# Patient Record
Sex: Male | Born: 1965 | Race: White | Hispanic: No | Marital: Married | Smoking: Former smoker
Health system: Southern US, Community
[De-identification: ages and names within clinical notes are randomized; demographics above are authoritative.]

## PROBLEM LIST (undated history)

## (undated) DIAGNOSIS — F419 Anxiety disorder, unspecified: Secondary | ICD-10-CM

## (undated) DIAGNOSIS — Z87442 Personal history of urinary calculi: Secondary | ICD-10-CM

## (undated) DIAGNOSIS — D72819 Decreased white blood cell count, unspecified: Secondary | ICD-10-CM

## (undated) DIAGNOSIS — R5081 Fever presenting with conditions classified elsewhere: Secondary | ICD-10-CM

## (undated) DIAGNOSIS — J189 Pneumonia, unspecified organism: Secondary | ICD-10-CM

## (undated) DIAGNOSIS — D709 Neutropenia, unspecified: Secondary | ICD-10-CM

## (undated) DIAGNOSIS — E0590 Thyrotoxicosis, unspecified without thyrotoxic crisis or storm: Secondary | ICD-10-CM

## (undated) DIAGNOSIS — E039 Hypothyroidism, unspecified: Secondary | ICD-10-CM

## (undated) DIAGNOSIS — Z923 Personal history of irradiation: Secondary | ICD-10-CM

## (undated) DIAGNOSIS — R590 Localized enlarged lymph nodes: Secondary | ICD-10-CM

## (undated) DIAGNOSIS — E0500 Thyrotoxicosis with diffuse goiter without thyrotoxic crisis or storm: Secondary | ICD-10-CM

## (undated) HISTORY — DX: Neutropenia, unspecified: D70.9

## (undated) HISTORY — PX: CLAVICLE SURGERY: SHX598

## (undated) HISTORY — DX: Hypothyroidism, unspecified: E03.9

## (undated) HISTORY — PX: FRACTURE SURGERY: SHX138

## (undated) HISTORY — PX: LITHOTRIPSY: SUR834

## (undated) HISTORY — DX: Thyrotoxicosis, unspecified without thyrotoxic crisis or storm: E05.90

---

## 2000-02-17 ENCOUNTER — Encounter: Payer: Self-pay | Admitting: Orthopedic Surgery

## 2000-02-17 ENCOUNTER — Ambulatory Visit (HOSPITAL_COMMUNITY): Admission: RE | Admit: 2000-02-17 | Discharge: 2000-02-17 | Payer: Self-pay | Admitting: Orthopedic Surgery

## 2000-09-03 ENCOUNTER — Emergency Department (HOSPITAL_COMMUNITY): Admission: EM | Admit: 2000-09-03 | Discharge: 2000-09-03 | Payer: Self-pay | Admitting: Emergency Medicine

## 2000-09-03 ENCOUNTER — Encounter: Payer: Self-pay | Admitting: Emergency Medicine

## 2003-03-23 ENCOUNTER — Encounter: Admission: RE | Admit: 2003-03-23 | Discharge: 2003-03-23 | Payer: Self-pay | Admitting: Internal Medicine

## 2003-07-03 ENCOUNTER — Emergency Department (HOSPITAL_COMMUNITY): Admission: AD | Admit: 2003-07-03 | Discharge: 2003-07-03 | Payer: Self-pay | Admitting: Family Medicine

## 2007-04-26 ENCOUNTER — Emergency Department (HOSPITAL_COMMUNITY): Admission: EM | Admit: 2007-04-26 | Discharge: 2007-04-26 | Payer: Self-pay | Admitting: Emergency Medicine

## 2008-03-05 ENCOUNTER — Ambulatory Visit (HOSPITAL_COMMUNITY): Admission: RE | Admit: 2008-03-05 | Discharge: 2008-03-05 | Payer: Self-pay | Admitting: Urology

## 2008-03-08 ENCOUNTER — Emergency Department (HOSPITAL_COMMUNITY): Admission: EM | Admit: 2008-03-08 | Discharge: 2008-03-09 | Payer: Self-pay | Admitting: Emergency Medicine

## 2011-02-21 LAB — URINALYSIS, ROUTINE W REFLEX MICROSCOPIC
Nitrite: NEGATIVE
Protein, ur: NEGATIVE
Specific Gravity, Urine: 1.028
pH: 6

## 2011-02-21 LAB — URINE MICROSCOPIC-ADD ON

## 2012-02-21 ENCOUNTER — Emergency Department (HOSPITAL_COMMUNITY): Payer: BC Managed Care – PPO

## 2012-02-21 ENCOUNTER — Emergency Department (HOSPITAL_COMMUNITY)
Admission: EM | Admit: 2012-02-21 | Discharge: 2012-02-21 | Disposition: A | Discharge status: Home / Self Care | Payer: BC Managed Care – PPO | Attending: Emergency Medicine | Admitting: Emergency Medicine

## 2012-02-21 ENCOUNTER — Encounter (HOSPITAL_COMMUNITY): Payer: Self-pay | Admitting: Emergency Medicine

## 2012-02-21 DIAGNOSIS — M25571 Pain in right ankle and joints of right foot: Secondary | ICD-10-CM

## 2012-02-21 DIAGNOSIS — M25579 Pain in unspecified ankle and joints of unspecified foot: Secondary | ICD-10-CM | POA: Insufficient documentation

## 2012-02-21 DIAGNOSIS — R079 Chest pain, unspecified: Secondary | ICD-10-CM | POA: Insufficient documentation

## 2012-02-21 DIAGNOSIS — R0781 Pleurodynia: Secondary | ICD-10-CM

## 2012-02-21 MED ORDER — OXYCODONE-ACETAMINOPHEN 5-325 MG PO TABS
2.0000 | ORAL_TABLET | Freq: Once | ORAL | Status: AC
  Administered 2012-02-21: 2 via ORAL
  Filled 2012-02-21: qty 2

## 2012-02-21 MED ORDER — HYDROCODONE-ACETAMINOPHEN 5-325 MG PO TABS: 2.0000 | ORAL_TABLET | ORAL | Status: DC | PRN
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
## 2012-02-21 NOTE — ED Provider Notes (Signed)
Medical screening examination/treatment/procedure(s) were performed by non-physician practitioner and as supervising physician I was immediately available for consultation/collaboration.   Benny Lennert, MD 02/21/12 [phone]

## 2012-02-21 NOTE — ED Notes (Signed)
Patient transported to X-ray 
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
## 2014-05-20 ENCOUNTER — Emergency Department (HOSPITAL_COMMUNITY): Payer: BC Managed Care – PPO

## 2014-05-20 ENCOUNTER — Encounter (HOSPITAL_COMMUNITY): Payer: Self-pay | Admitting: *Deleted

## 2014-05-20 ENCOUNTER — Emergency Department (HOSPITAL_COMMUNITY)
Admission: EM | Admit: 2014-05-20 | Discharge: 2014-05-20 | Disposition: A | Discharge status: Home / Self Care | Payer: BC Managed Care – PPO | Attending: Emergency Medicine | Admitting: Emergency Medicine

## 2014-05-20 DIAGNOSIS — Z720 Tobacco use: Secondary | ICD-10-CM | POA: Insufficient documentation

## 2014-05-20 DIAGNOSIS — G5632 Lesion of radial nerve, left upper limb: Secondary | ICD-10-CM

## 2014-05-20 DIAGNOSIS — R531 Weakness: Secondary | ICD-10-CM | POA: Diagnosis present

## 2014-05-20 DIAGNOSIS — M21332 Wrist drop, left wrist: Secondary | ICD-10-CM | POA: Insufficient documentation

## 2014-05-20 DIAGNOSIS — R29898 Other symptoms and signs involving the musculoskeletal system: Secondary | ICD-10-CM

## 2014-05-20 LAB — CBC
HEMATOCRIT: 38.1 % — AB (ref 39.0–52.0)
HEMOGLOBIN: 12.9 g/dL — AB (ref 13.0–17.0)
MCH: 31.5 pg (ref 26.0–34.0)
MCHC: 33.9 g/dL (ref 30.0–36.0)
MCV: 93.2 fL (ref 78.0–100.0)
PLATELETS: 262 10*3/uL (ref 150–400)
RBC: 4.09 MIL/uL — ABNORMAL LOW (ref 4.22–5.81)
RDW: 13.4 % (ref 11.5–15.5)
WBC: 6.3 10*3/uL (ref 4.0–10.5)

## 2014-05-20 LAB — COMPREHENSIVE METABOLIC PANEL
ALT: 31 U/L (ref 0–53)
AST: 26 U/L (ref 0–37)
Albumin: 4.4 g/dL (ref 3.5–5.2)
Alkaline Phosphatase: 55 U/L (ref 39–117)
Anion gap: 7 (ref 5–15)
BILIRUBIN TOTAL: 1.2 mg/dL (ref 0.3–1.2)
BUN: 16 mg/dL (ref 6–23)
CALCIUM: 9 mg/dL (ref 8.4–10.5)
CHLORIDE: 107 meq/L (ref 96–112)
CO2: 24 mmol/L (ref 19–32)
Creatinine, Ser: 0.81 mg/dL (ref 0.50–1.35)
Glucose, Bld: 96 mg/dL (ref 70–99)
POTASSIUM: 3.9 mmol/L (ref 3.5–5.1)
Sodium: 138 mmol/L (ref 135–145)
TOTAL PROTEIN: 6.3 g/dL (ref 6.0–8.3)

## 2014-05-20 LAB — DIFFERENTIAL
BASOS ABS: 0 10*3/uL (ref 0.0–0.1)
BASOS PCT: 0 % (ref 0–1)
EOS PCT: 1 % (ref 0–5)
Eosinophils Absolute: 0 10*3/uL (ref 0.0–0.7)
LYMPHS PCT: 23 % (ref 12–46)
Lymphs Abs: 1.4 10*3/uL (ref 0.7–4.0)
MONO ABS: 0.6 10*3/uL (ref 0.1–1.0)
MONOS PCT: 10 % (ref 3–12)
NEUTROS ABS: 4.2 10*3/uL (ref 1.7–7.7)
Neutrophils Relative %: 66 % (ref 43–77)

## 2014-05-20 LAB — I-STAT TROPONIN, ED: Troponin i, poc: 0 ng/mL (ref 0.00–0.08)

## 2014-05-20 NOTE — ED Provider Notes (Signed)
CSN: [id number]     Arrival date & time 05/20/14  1620 History   First MD Initiated Contact with Patient 05/20/14 1641     Chief Complaint  Patient presents with  . Weakness     (Consider location/radiation/quality/duration/timing/severity/associated sxs/prior Treatment) HPI Comments: Pt has had weakness in L hand & wrist since 3:30 am this morning.   Patient is a 48 y.o. male presenting with weakness. The history is provided by the patient. No language interpreter was used.  Weakness This is a new problem. The current episode started 12 to 24 hours ago. The problem occurs constantly. The problem has been gradually improving. Pertinent negatives include no chest pain, no abdominal pain, no headaches and no shortness of breath. Associated symptoms comments: Bitemporal h/a worse with cough and pain at medial upper arm. Nothing aggravates the symptoms. Nothing relieves the symptoms. He has tried nothing for the symptoms. The treatment provided no relief.    History reviewed. No pertinent past medical history. History reviewed. No pertinent past surgical history. History reviewed. No pertinent family history. History  Substance Use Topics  . Smoking status: Current Every Day Smoker -- 0.50 packs/day    Types: Cigarettes  . Smokeless tobacco: Not on file  . Alcohol Use: No    Review of Systems  Constitutional: Negative for fever, activity change, appetite change and fatigue.  HENT: Negative for congestion, facial swelling, rhinorrhea and trouble swallowing.   Eyes: Negative for photophobia and pain.  Respiratory: Negative for cough, chest tightness and shortness of breath.   Cardiovascular: Negative for chest pain and leg swelling.  Gastrointestinal: Negative for nausea, vomiting, abdominal pain, diarrhea and constipation.  Endocrine: Negative for polydipsia and polyuria.  Genitourinary: Negative for dysuria, urgency, decreased urine volume and difficulty urinating.  Musculoskeletal:  Negative for back pain and gait problem.  Skin: Negative for color change, rash and wound.  Allergic/Immunologic: Negative for immunocompromised state.  Neurological: Positive for weakness. Negative for dizziness, facial asymmetry, speech difficulty, numbness and headaches.  Psychiatric/Behavioral: Negative for confusion, decreased concentration and agitation.      Allergies  Review of patient's allergies indicates no known allergies.  Home Medications   Prior to Admission medications   Medication Sig Start Date End Date Taking? Authorizing Provider  HYDROcodone-acetaminophen (NORCO/VICODIN) 5-325 MG per tablet Take 2 tablets by mouth every 4 (four) hours as needed for pain. 02/21/12   Hannah Muthersbaugh, PA-C   BP 114/72 mmHg  Pulse 70  Temp(Src) 98 F (36.7 C)  Resp 17  Wt 155 lb (70.308 kg)  SpO2 96% Physical Exam  Constitutional: [name] oriented to person, place, and time. He appears well-developed and well-nourished. No distress.  HENT:  Head: Normocephalic and atraumatic.  Mouth/Throat: No oropharyngeal exudate.  Eyes: Pupils are equal, round, and reactive to light.  Neck: Normal range of motion. Neck supple.  Cardiovascular: Normal rate, regular rhythm and normal heart sounds.  Exam reveals no gallop and no friction rub.   No murmur heard. Pulmonary/Chest: Effort normal and breath sounds normal. No respiratory distress. He has no wheezes. He has no rales.  Abdominal: Soft. Bowel sounds are normal. He exhibits no distension and no mass. There is no tenderness. There is no rebound and no guarding.  Musculoskeletal: Normal range of motion. He exhibits no edema or tenderness.       Arms: Neurological: [name] alert and oriented to person, place, and time. He displays no atrophy and no tremor. No cranial nerve deficit or sensory deficit. He  exhibits abnormal muscle tone. He displays no seizure activity. Coordination normal. GCS eye subscore is 4. GCS verbal subscore is 5. GCS  motor subscore is 6.  Weakness in L wrist, hand, nml sensation.   Skin: Skin is warm and dry.  Psychiatric: He has a normal mood and affect.    ED Course  Procedures (including critical care time) Labs Review Labs Reviewed  CBC - Abnormal; Notable for the following:    RBC 4.09 (*)    Hemoglobin 12.9 (*)    HCT 38.1 (*)    All other components within normal limits  DIFFERENTIAL  COMPREHENSIVE METABOLIC PANEL  I-STAT TROPOININ, ED    Imaging Review Ct Head Wo Contrast  05/20/2014   CLINICAL DATA:  Left-sided weakness.  Pain/ discomfort in left arm.  EXAM: CT HEAD WITHOUT CONTRAST  TECHNIQUE: Contiguous axial images were obtained from the base of the skull through the vertex without intravenous contrast.  COMPARISON:  None.  FINDINGS: Sinuses/Soft tissues: Cerumen in the left external ear canal. Clear paranasal sinuses and mastoid air cells.  Intracranial: No mass lesion, hemorrhage, hydrocephalus, acute infarct, intra-axial, or extra-axial fluid collection.  IMPRESSION: Normal head CT.   Electronically Signed   By: Abigail Miyamoto M.D.   On: 05/20/2014 18:17   Mr Brain Wo Contrast  05/20/2014   CLINICAL DATA:  Left hand weakness  EXAM: MRI HEAD WITHOUT CONTRAST  TECHNIQUE: Multiplanar, multiecho pulse sequences of the brain and surrounding structures were obtained without intravenous contrast.  COMPARISON:  CT head 05/20/2014  FINDINGS: Negative for acute infarct.  Several small white matter hyperintensities bilaterally have a chronic appearance. No cortical infarct. Brainstem and cerebellum appear normal  Negative for hemorrhage or fluid collection.  Negative for mass or edema.  Ventricle size is normal. Pituitary normal in size. Paranasal sinuses are clear.  IMPRESSION: No acute abnormality. Scattered small white matter hyperintensities are typical for age and may be related to chronic microvascular ischemia or migraine headache.   Electronically Signed   By: Franchot Gallo M.D.   On:  05/20/2014 20:01     EKG Interpretation None      MDM   Final diagnoses:  Weakness of left hand  Left hand weakness  Wrist drop, left  Radial nerve palsy, left    Pt is a 48 y.o. male with Pmhx as above who presents with L hand weakness since 3:30 am (LSN 8:30 PM yesterday).  He has also had bitemporal h/a, worse w/ cough, and pain in medial upper L arm w/ ttp but no skin changes or swelling. No other focal neuro findings, no numbness or weakness.   CT head nml, spoke w/ neuro who rec MRI which was also nml. Suspect radial nerve palsy.    Micah E Peragine evaluation in the Emergency Department is complete. It has been determined that no acute conditions requiring further emergency intervention are present at this time. The patient/guardian have been advised of the diagnosis and plan. We have discussed signs and symptoms that warrant return to the ED, such as changes or worsening in symptoms, worsening pain, arm swelling, worsening weakness, numbness. WIll place in splint and have him f/u woth hand surgery.       Ernestina Patches, MD 05/20/14 2037

## 2014-05-20 NOTE — ED Notes (Signed)
PT refused wrist splint.

## 2014-05-20 NOTE — ED Notes (Addendum)
Pt reports waking up this am with "funny feeling" to his left arm. Reports a pain/discomfort to left upper arm,  Feel like he doesn't have full use of his left arm and it feels heavy, difficulty gripping objects. Reports also having headaches today, grip is slightly weaker on left side.

## 2014-05-20 NOTE — ED Notes (Signed)
Patient returned from CT

## 2014-05-20 NOTE — Discharge Instructions (Signed)
Radial Nerve Palsy Wrist drop is also known as radial nerve palsy. It is a condition in which you can not extend your wrist. This means if you are standing with your elbow bent at a right angle and with the top of your hand pointed at the ceiling, you can not hold your hand up. It falls toward the floor.  This action of extending your wrist is caused by the muscles in the back of your arm. These muscles are controlled by the radial nerve. This means that anything affecting the radial nerve so it can not tell the muscles how to work will cause wrist drop. This is medically called radial nerve palsy. Also the radial nerve is a motor and sensory nerve so anything affecting it causes problems with movement and feeling. CAUSES  Some more common causes of wrist drop are:  A break (fracture) of the large bone in the arm between your shoulder and your elbow (humerus). This is because the radial nerve winds around the humerus.  Improper use of crutches causes this because the radial nerve runs through the armpit (axilla). Crutches which are too long can put pressure on the nerve. This is sometimes called crutch palsy.  Falling asleep with your arm over a chair and supported on the back is a common cause. This is sometimes called Saturday Night Syndrome.  Wrist drop can be associated with lead poisoning because of the effect of lead on the radial nerve. SYMPTOMS  The wrist drop is an obvious problem, but there may also be numbness in the back of the arm, forearm or hand which provides feeling in these areas by the radial nerve. There can be difficulty straightening out the elbow in addition to the wrist. There may be numbness, tingling, pain, burning sensations or other abnormal feelings. Symptoms depend entirely on where the radial nerve is injured. DIAGNOSIS   Wrist drop is obvious just by looking at it. Your caregiver may make the diagnosis by taking your history and doing a couple tests.  One test which  may be done is a nerve conduction study. This test shows if the radial nerve is conducting signals well. If not, it can determine where the nerve problem is.  Sometimes X-ray studies are done. Your caregiver will determine if further testing needs to be done. TREATMENT   Usually if the problem is found to be pressure on the nerve, simply removing the pressure will allow the nerve to go back to normal in a few weeks to a few months. Other treatments will depend upon the cause found.  Only take over-the-counter or prescription medicines for pain, discomfort, or fever as directed by your caregiver.  Sometimes seizure medications are used.  Steroids are sometimes given to decrease swelling if it is thought to be a possible cause. Document Released: 01/12/2006 Document Revised: 07/31/2011 Document Reviewed: 07/23/2013 Exit[employer] Patient Information 2015 [employer]. This information is not intended to replace advice given to you by your health care provider. Make sure you discuss any questions you have with your health care provider.  

## 2014-12-28 ENCOUNTER — Encounter: Payer: Self-pay | Admitting: Internal Medicine

## 2015-01-12 ENCOUNTER — Ambulatory Visit (AMBULATORY_SURGERY_CENTER): Payer: Self-pay

## 2015-01-12 VITALS — Ht 68.0 in | Wt 162.4 lb

## 2015-01-12 DIAGNOSIS — Z1211 Encounter for screening for malignant neoplasm of colon: Secondary | ICD-10-CM

## 2015-01-12 NOTE — Progress Notes (Signed)
Per pt, no allergies to soy or egg products.Pt not taking any weight loss meds or using  O2 at home. 

## 2015-01-14 ENCOUNTER — Other Ambulatory Visit: Payer: Self-pay | Admitting: Internal Medicine

## 2015-01-14 DIAGNOSIS — R740 Nonspecific elevation of levels of transaminase and lactic acid dehydrogenase [LDH]: Principal | ICD-10-CM

## 2015-01-14 DIAGNOSIS — R7401 Elevation of levels of liver transaminase levels: Secondary | ICD-10-CM

## 2015-01-19 ENCOUNTER — Ambulatory Visit
Admission: RE | Admit: 2015-01-19 | Discharge: 2015-01-19 | Disposition: A | Discharge status: Home / Self Care | Payer: BLUE CROSS/BLUE SHIELD | Source: Ambulatory Visit | Attending: Internal Medicine | Admitting: Internal Medicine

## 2015-01-19 DIAGNOSIS — R740 Nonspecific elevation of levels of transaminase and lactic acid dehydrogenase [LDH]: Principal | ICD-10-CM

## 2015-01-19 DIAGNOSIS — R7401 Elevation of levels of liver transaminase levels: Secondary | ICD-10-CM

## 2015-02-02 ENCOUNTER — Encounter: Payer: Self-pay | Admitting: Internal Medicine

## 2015-02-10 ENCOUNTER — Ambulatory Visit (AMBULATORY_SURGERY_CENTER): Payer: BLUE CROSS/BLUE SHIELD | Admitting: Internal Medicine

## 2015-02-10 ENCOUNTER — Encounter: Payer: Self-pay | Admitting: Internal Medicine

## 2015-02-10 VITALS — BP 113/81 | HR 71 | Temp 95.9°F | Resp 15 | Ht 68.0 in | Wt 162.0 lb

## 2015-02-10 DIAGNOSIS — D129 Benign neoplasm of anus and anal canal: Secondary | ICD-10-CM

## 2015-02-10 DIAGNOSIS — R3989 Other symptoms and signs involving the genitourinary system: Secondary | ICD-10-CM

## 2015-02-10 DIAGNOSIS — Z1211 Encounter for screening for malignant neoplasm of colon: Secondary | ICD-10-CM | POA: Diagnosis not present

## 2015-02-10 DIAGNOSIS — K621 Rectal polyp: Secondary | ICD-10-CM | POA: Diagnosis not present

## 2015-02-10 DIAGNOSIS — D128 Benign neoplasm of rectum: Secondary | ICD-10-CM

## 2015-02-10 MED ORDER — SODIUM CHLORIDE 0.9 % IV SOLN: 500.0000 mL | INTRAVENOUS | Status: DC

## 2015-02-10 NOTE — Progress Notes (Signed)
Patient awakening,vss,report to rn 

## 2015-02-10 NOTE — Progress Notes (Signed)
Called to room to assist during endoscopic procedure.  Patient ID and intended procedure confirmed with present staff. Received instructions for my participation in the procedure from the performing physician.  

## 2015-02-10 NOTE — Op Note (Signed)
[name]  [name] [address] [place], [id number]   COLONOSCOPY PROCEDURE REPORT  PATIENT: Lance [name], Lance [name]  MR#: [id number] BIRTHDATE: 1965-08-15 , 49  yrs. old GENDER: male ENDOSCOPIST: Gatha Mayer, MD, [facility] PROCEDURE DATE:  02/10/2015 PROCEDURE:   Colonoscopy, screening and Colonoscopy with snare polypectomy First Screening Colonoscopy - Avg.  risk and is 50 yrs.  old or older Yes.  Prior Negative Screening - Now for repeat screening. N/A      Polyps removed today? Yes ASA CLASS:   Class I INDICATIONS:Screening for colonic neoplasia and Colorectal Neoplasm Risk Assessment for this procedure is average risk. MEDICATIONS: Propofol 250 mg IV and Monitored anesthesia care  DESCRIPTION OF PROCEDURE:   After the risks benefits and alternatives of the procedure were thoroughly explained, informed consent was obtained.  The digital rectal exam revealed nodules on prostate without urinary obstruction.   The LB [id number] endoscope was introduced through the anus and advanced to the   . No adverse events experienced.   The quality of the prep was excellent.  (MiraLax was used)  The instrument was then slowly withdrawn as the colon was fully examined. Estimated blood loss is zero unless otherwise noted in this procedure report.      COLON FINDINGS: A smooth sessile polyp measuring 3 mm in size was found in the rectum.  A polypectomy was performed with a cold snare.  The resection was complete, the polyp tissue was completely retrieved and sent to histology.   The examination was otherwise normal.  Retroflexed views revealed no abnormalities. The time to cecum = 2.6 Withdrawal time = 10.0   The scope was withdrawn and the procedure completed. COMPLICATIONS: There were no immediate complications.  ENDOSCOPIC IMPRESSION: 1.   Sessile polyp was found in the rectum; polypectomy was performed with a cold snare 2.   The examination was otherwise normal of the  colon. excellent prep - first screening 3.   Subtle nodularity to Right proximal lobe of prostate  RECOMMENDATIONS: 1.  Timing of repeat colonoscopy will be determined by pathology findings. 2.  Contact/see Dr.  Ardeth Perfect about suspected nodules on prostate exam.  eSigned:  Gatha Mayer, MD, [facility] 02/10/2015 2:19 PM   cc: Dr. Velna Hatchet and The Patient

## 2015-02-10 NOTE — Patient Instructions (Addendum)
I found and removed one small polyp that looks benign. I will let you know pathology results and when to have another routine colonoscopy by mail.  I thought your prostate has some small nodules - not sure what this means but should be examined again by primary MD or a urologist I think.  Please contact your primary MD Dr. Ardeth Perfect for his recommendation.  I appreciate the opportunity to care for you. Gatha Mayer, MD, FACG       YOU HAD AN ENDOSCOPIC PROCEDURE TODAY AT [facility]:   Refer to the procedure report that was given to you for any specific questions about what was found during the examination.  If the procedure report does not answer your questions, please call your gastroenterologist to clarify.  If you requested that your care partner not be given the details of your procedure findings, then the procedure report has been included in a sealed envelope for you to review at your convenience later.  YOU SHOULD EXPECT: Some feelings of bloating in the abdomen. Passage of more gas than usual.  Walking can help get rid of the air that was put into your GI tract during the procedure and reduce the bloating. If you had a lower endoscopy (such as a colonoscopy or flexible sigmoidoscopy) you may notice spotting of blood in your stool or on the toilet paper. If you underwent a bowel prep for your procedure, you may not have a normal bowel movement for a few days.  Please Note:  You might notice some irritation and congestion in your nose or some drainage.  This is from the oxygen used during your procedure.  There is no need for concern and it should clear up in a day or so.  SYMPTOMS TO REPORT IMMEDIATELY:   Following lower endoscopy (colonoscopy or flexible sigmoidoscopy):  Excessive amounts of blood in the stool  Significant tenderness or worsening of abdominal pains  Swelling of the abdomen that is new, acute  Fever of 100F or higher    For urgent  or emergent issues, a gastroenterologist can be reached at any hour by calling [phone].   DIET: Your first meal following the procedure should be a small meal and then it is ok to progress to your normal diet. Heavy or fried foods are harder to digest and may make you feel nauseous or bloated.  Likewise, meals heavy in dairy and vegetables can increase bloating.  Drink plenty of fluids but you should avoid alcoholic beverages for 24 hours.  ACTIVITY:  You should plan to take it easy for the rest of today and you should NOT DRIVE or use heavy machinery until tomorrow (because of the sedation medicines used during the test).    FOLLOW UP: Our staff will call the number listed on your records the next business day following your procedure to check on you and address any questions or concerns that you may have regarding the information given to you following your procedure. If we do not reach you, we will leave a message.  However, if you are feeling well and you are not experiencing any problems, there is no need to return our call.  We will assume that you have returned to your regular daily activities without incident.  If any biopsies were taken you will be contacted by phone or by letter within the next 1-3 weeks.  Please call us at [phone] if you have not heard about the biopsies in  3 weeks.    SIGNATURES/CONFIDENTIALITY: You and/or your care partner have signed paperwork which will be entered into your electronic medical record.  These signatures attest to the fact that that the information above on your After Visit Summary has been reviewed and is understood.  Full responsibility of the confidentiality of this discharge information lies with you and/or your care-partner.   Resume medications. Information given on polyps.

## 2015-02-11 ENCOUNTER — Telehealth: Payer: Self-pay

## 2015-02-11 NOTE — Telephone Encounter (Signed)
Left message on answering machine. 

## 2015-02-17 ENCOUNTER — Encounter: Payer: Self-pay | Admitting: Internal Medicine

## 2015-02-17 NOTE — Progress Notes (Signed)
Quick Note:  Hyperplastic rectal polyp - repeat colonoscopy 2026 ______

## 2015-08-21 DIAGNOSIS — Z833 Family history of diabetes mellitus: Secondary | ICD-10-CM | POA: Diagnosis not present

## 2015-08-21 DIAGNOSIS — Z13228 Encounter for screening for other metabolic disorders: Secondary | ICD-10-CM | POA: Diagnosis not present

## 2015-08-21 DIAGNOSIS — R42 Dizziness and giddiness: Secondary | ICD-10-CM | POA: Diagnosis not present

## 2015-08-21 DIAGNOSIS — R739 Hyperglycemia, unspecified: Secondary | ICD-10-CM | POA: Diagnosis not present

## 2015-12-20 DIAGNOSIS — Z0000 Encounter for general adult medical examination without abnormal findings: Secondary | ICD-10-CM | POA: Diagnosis not present

## 2015-12-20 DIAGNOSIS — Z125 Encounter for screening for malignant neoplasm of prostate: Secondary | ICD-10-CM | POA: Diagnosis not present

## 2015-12-20 DIAGNOSIS — R946 Abnormal results of thyroid function studies: Secondary | ICD-10-CM | POA: Diagnosis not present

## 2015-12-22 DIAGNOSIS — Z0000 Encounter for general adult medical examination without abnormal findings: Secondary | ICD-10-CM | POA: Diagnosis not present

## 2015-12-22 DIAGNOSIS — Z23 Encounter for immunization: Secondary | ICD-10-CM | POA: Diagnosis not present

## 2015-12-22 DIAGNOSIS — Z6824 Body mass index (BMI) 24.0-24.9, adult: Secondary | ICD-10-CM | POA: Diagnosis not present

## 2015-12-22 DIAGNOSIS — R946 Abnormal results of thyroid function studies: Secondary | ICD-10-CM | POA: Diagnosis not present

## 2015-12-22 DIAGNOSIS — J329 Chronic sinusitis, unspecified: Secondary | ICD-10-CM | POA: Diagnosis not present

## 2015-12-22 DIAGNOSIS — Z1389 Encounter for screening for other disorder: Secondary | ICD-10-CM | POA: Diagnosis not present

## 2016-01-10 ENCOUNTER — Other Ambulatory Visit (HOSPITAL_COMMUNITY): Payer: Self-pay | Admitting: Internal Medicine

## 2016-01-10 DIAGNOSIS — E0590 Thyrotoxicosis, unspecified without thyrotoxic crisis or storm: Secondary | ICD-10-CM

## 2016-01-25 ENCOUNTER — Ambulatory Visit (HOSPITAL_COMMUNITY)
Admission: RE | Admit: 2016-01-25 | Discharge: 2016-01-25 | Disposition: A | Discharge status: Home / Self Care | Payer: BLUE CROSS/BLUE SHIELD | Source: Ambulatory Visit | Attending: Internal Medicine | Admitting: Internal Medicine

## 2016-01-25 DIAGNOSIS — E0590 Thyrotoxicosis, unspecified without thyrotoxic crisis or storm: Secondary | ICD-10-CM | POA: Insufficient documentation

## 2016-01-25 MED ORDER — SODIUM IODIDE I 131 CAPSULE
7.4000 | Freq: Once | INTRAVENOUS | Status: AC | PRN
  Administered 2016-01-25: 7.4 via ORAL

## 2016-01-26 ENCOUNTER — Encounter (HOSPITAL_COMMUNITY)
Admission: RE | Admit: 2016-01-26 | Discharge: 2016-01-26 | Disposition: A | Discharge status: Home / Self Care | Payer: BLUE CROSS/BLUE SHIELD | Source: Ambulatory Visit | Attending: Internal Medicine | Admitting: Internal Medicine

## 2016-01-26 DIAGNOSIS — E0590 Thyrotoxicosis, unspecified without thyrotoxic crisis or storm: Secondary | ICD-10-CM | POA: Diagnosis not present

## 2016-01-26 MED ORDER — SODIUM PERTECHNETATE TC 99M INJECTION
9.6000 | Freq: Once | INTRAVENOUS | Status: AC | PRN
  Administered 2016-01-26: 9.6 via INTRAVENOUS

## 2016-03-06 ENCOUNTER — Encounter: Payer: Self-pay | Admitting: Endocrinology

## 2016-03-06 ENCOUNTER — Ambulatory Visit (INDEPENDENT_AMBULATORY_CARE_PROVIDER_SITE_OTHER): Payer: BLUE CROSS/BLUE SHIELD | Admitting: Endocrinology

## 2016-03-06 DIAGNOSIS — E0590 Thyrotoxicosis, unspecified without thyrotoxic crisis or storm: Secondary | ICD-10-CM

## 2016-03-06 LAB — T4, FREE: FREE T4: 1.83 ng/dL — AB (ref 0.60–1.60)

## 2016-03-06 LAB — TSH: TSH: 0.01 u[IU]/mL — AB (ref 0.35–4.50)

## 2016-03-06 MED ORDER — METHIMAZOLE 10 MG PO TABS: 20.0000 mg | ORAL_TABLET | Freq: Two times a day (BID) | ORAL | 2 refills | Status: DC

## 2016-03-06 NOTE — Progress Notes (Signed)
Subjective:    Patient ID: Lance [name], male    DOB: Apr 24, 1966, 50 y.o.   MRN: [id number]  HPI Pt was ref here by Dr Ardeth Perfect, for hyperthyroidism.  He was noted at routine physical in mid-2017, to have hyperthyroidism.  He says, in retrospect, he has slight tremor of the hands, but no assoc fever.  No prior thyroid hx.  approx 2-3 weeks ago, he was rx'ed with tapazole.  He has never had XRT to the anterior neck, or thyroid surgery.   He does not consume kelp or any other prescribed or non-prescribed thyroid medication.  He has never been on amiodarone.   Past Medical History:  Diagnosis Date  . Hyperthyroidism     Past Surgical History:  Procedure Laterality Date  . CLAVICLE SURGERY     has plates and 075-GRM years ago  . kidney stones     had lithrotripsy    Social History   Social History  . Marital status: Married    Spouse name: N/A  . Number of children: N/A  . Years of education: N/A   Occupational History  . Not on file.   Social History Main Topics  . Smoking status: Former Smoker    Packs/day: 0.50    Types: Cigarettes    Quit date: 09/20/2014  . Smokeless tobacco: Never Used     Comment: Quit 3 months ago  . Alcohol use No  . Drug use: No  . Sexual activity: Not on file   Other Topics Concern  . Not on file   Social History Narrative  . No narrative on file    No current outpatient prescriptions on file prior to visit.   No current facility-administered medications on file prior to visit.     No Known Allergies  Family History  Problem Relation Age of Onset  . Colon cancer Neg Hx   . Esophageal cancer Neg Hx   . Prostate cancer Neg Hx   . Rectal cancer Neg Hx   . Stomach cancer Neg Hx   . Thyroid disease Neg Hx     BP 127/74   Pulse 90   Ht 5\' 9"  (1.753 m)   Wt 169 lb (76.7 kg)   BMI 24.96 kg/m    Review of Systems denies weight loss, headache, hoarseness, diplopia, sob, diarrhea, polyuria, excessive diaphoresis, tremor,  anxiety, easy bruising, and rhinorrhea.  He has heat intolerance, muscle weakness, and palpitations.       Objective:   Physical Exam VS: see vs page GEN: no distress HEAD: head: no deformity eyes: no periorbital swelling.  Moderate bilat proptosis external nose and ears are normal mouth: no lesion seen NECK: supple, thyroid is not enlarged CHEST WALL: no deformity LUNGS: clear to auscultation CV: reg rate and rhythm, no murmur.  ABD: abdomen is soft, nontender.  no hepatosplenomegaly.  not distended.  no hernia MUSCULOSKELETAL: muscle bulk and strength are grossly normal.  no obvious joint swelling.  gait is normal and steady.  EXTEMITIES: no edema.   PULSES: no carotid bruit.   NEURO:  cn 2-12 grossly intact.   readily moves all 4's.  sensation is intact to touch on all 4's. Slight tremor of the hands.  SKIN:  Normal texture and temperature.  No rash or suspicious lesion is visible.   NODES:  None palpable at the neck.   PSYCH: alert, well-oriented.  Does not appear anxious nor depressed.    Thyroid nuc med scan: high uptake, with  diffuse pattern.  Lab Results  Component Value Date   TSH 0.01 (L) 03/06/2016   I have reviewed outside records, and summarized: Pt was seen for routine wellness visit.  He was noted to have abnormal TFT, and referred here.  He said only in retrospect, he has sxs c/w hyperthyroidism.      Assessment & Plan:  Grave's Dz, new to me Hyperthyroidism, due to the goiter: we discussed rx options.  he declines RAI, due to child-care responsibilities.  I have sent a prescription to your pharmacy to increase tapazole.   He declines to f/u here.  He requested flu with Dr Adelfa Koh with me.  I would be happy to see you back here as needed.

## 2016-03-06 NOTE — Patient Instructions (Addendum)
blood tests are requested for you today.  We'll let you know about the results.  If ever you have fever while taking methimazole, stop it and call us, even if the reason is obvious, because of the risk of a rare side-effect.   I would be happy to see you back here as needed.

## 2016-03-07 ENCOUNTER — Telehealth: Payer: Self-pay | Admitting: Endocrinology

## 2016-03-07 MED ORDER — METHIMAZOLE 10 MG PO TABS: 20.0000 mg | ORAL_TABLET | Freq: Two times a day (BID) | ORAL | 2 refills | Status: DC

## 2016-03-07 NOTE — Telephone Encounter (Signed)
Patient said his prescription was accidentally sent to the [facility] on [address], but it should have been sent to the [facility] on [address]. Please correct.

## 2016-03-07 NOTE — Telephone Encounter (Signed)
Refill corrected and submitted.  

## 2016-03-08 ENCOUNTER — Telehealth: Payer: Self-pay | Admitting: Endocrinology

## 2016-03-08 NOTE — Telephone Encounter (Signed)
I contacted the patient and advised via voicemail we submitted his Methimazole medication on 03/07/2016 to the [facility] on [address]. Patient advised to contact [facility] for the refill and to call us back if he had any questions.

## 2016-03-08 NOTE — Telephone Encounter (Signed)
Patient stated that you may have put I the wrong pharmacy, he keep getting calls from [facility] and he [facility] not use [facility], he uses [facility].

## 2016-03-26 ENCOUNTER — Ambulatory Visit (HOSPITAL_COMMUNITY)
Admission: EM | Admit: 2016-03-26 | Discharge: 2016-03-26 | Disposition: A | Discharge status: Home / Self Care | Payer: BLUE CROSS/BLUE SHIELD | Attending: Emergency Medicine | Admitting: Emergency Medicine

## 2016-03-26 ENCOUNTER — Encounter (HOSPITAL_COMMUNITY): Payer: Self-pay | Admitting: Family Medicine

## 2016-03-26 DIAGNOSIS — B349 Viral infection, unspecified: Secondary | ICD-10-CM

## 2016-03-26 DIAGNOSIS — R509 Fever, unspecified: Secondary | ICD-10-CM | POA: Diagnosis not present

## 2016-03-26 LAB — POCT RAPID STREP A: Streptococcus, Group A Screen (Direct): NEGATIVE

## 2016-03-26 NOTE — Discharge Instructions (Signed)
Sorry that you are not feeling well. You have no symptoms or exam findings to explain your fever. This is most likely a viral syndrome that will take time to run its course. If this continues without explanation suggest you f/u with your PCP to discuss other etiologies of your fevers. You ask if it could be related to your medications and this is certainly possible, however will need to discuss further with your Endocrinology. Hope you feel better soon.

## 2016-03-26 NOTE — ED Provider Notes (Signed)
CSN: [id number]     Arrival date & time 03/26/16  1452 History   First MD Initiated Contact with Patient 03/26/16 1629     Chief Complaint  Patient presents with  . Influenza   (Consider location/radiation/quality/duration/timing/severity/associated sxs/prior Treatment) 50 yo male with a recent diagnosis of Graves disease presents with a fever (max 102.0) in the last few days. He reports having the flu vaccine on Tuesday. Also Tapazole was increased a week ago. He has a mild scratchy throat, but otherwise fevers, malaise and body aches. See remainder of ROS for further details of his history. Also no known exposures are noted.       Past Medical History:  Diagnosis Date  . Hyperthyroidism    Past Surgical History:  Procedure Laterality Date  . CLAVICLE SURGERY     has plates and 075-GRM years ago  . kidney stones     had lithrotripsy   Family History  Problem Relation Age of Onset  . Colon cancer Neg Hx   . Esophageal cancer Neg Hx   . Prostate cancer Neg Hx   . Rectal cancer Neg Hx   . Stomach cancer Neg Hx   . Thyroid disease Neg Hx    Social History  Substance Use Topics  . Smoking status: Former Smoker    Packs/day: 0.50    Types: Cigarettes    Quit date: 09/20/2014  . Smokeless tobacco: Never Used     Comment: Quit 3 months ago  . Alcohol use No    Review of Systems  Constitutional: Positive for fatigue and fever. Negative for chills.  HENT: Positive for sore throat. Negative for congestion and ear pain.   Eyes: Negative.   Respiratory: Negative.   Cardiovascular: Negative.  Negative for palpitations.  Gastrointestinal: Negative.   Genitourinary: Negative.   Skin: Negative.     Allergies  Patient has no known allergies.  Home Medications   Prior to Admission medications   Medication Sig Start Date End Date Taking? Authorizing Provider  methimazole (TAPAZOLE) 10 MG tablet Take 2 tablets (20 mg total) by mouth 2 (two) times daily. 03/07/16   Renato Shin, MD   Meds Ordered and Administered this Visit  Medications - No data to display  BP 129/82 (BP Location: Left Arm)   Pulse 116   Temp 100.2 F (37.9 C) (Oral)   Resp 14   SpO2 98%  No data found.   Physical Exam  Constitutional: [name] oriented to person, place, and time. He appears well-developed and well-nourished. No distress.  HENT:  Head: Normocephalic and atraumatic.  Right Ear: External ear normal.  Left Ear: External ear normal.  Nose: Nose normal.  Mouth/Throat: Oropharynx is clear and moist. No oropharyngeal exudate.  Eyes: Pupils are equal, round, and reactive to light.  Cardiovascular: Regular rhythm.   Mild tacycardia  Pulmonary/Chest: Effort normal and breath sounds normal.  Abdominal: Soft. Bowel sounds are normal.  Lymphadenopathy:    He has no cervical adenopathy.  Neurological: [name] alert and oriented to person, place, and time.  Skin: Skin is warm and dry. [name] not diaphoretic.  Psychiatric: His behavior is normal.  Nursing note and vitals reviewed.   Urgent Care Course   Clinical Course     Procedures (including critical care time)  Labs Review Labs Reviewed  POCT RAPID STREP A    Imaging Review No results found.   Visual Acuity Review  Right Eye Distance:   Left Eye Distance:  Bilateral Distance:    Right Eye Near:   Left Eye Near:    Bilateral Near:         MDM   1. Viral syndrome   2. Fever chills    Discussed with Dr. Bridgett Larsson today. Patient with a 2-3 day history of malaise, fevers, scratchy throat following the influenza vaccine. This is most likely etiology given no additional symptoms, normal exam and normal strep. Suggest treat with OTC NSAID's or Tylenol as directed for fevers and f/u with PCP if not improving. Tachycardia thought secondary to fevers, no symptoms associated. If he worsens then suggest f/u in the ER emergently if needed.     Bjorn Pippin, PA-C 03/26/16 1755

## 2016-03-26 NOTE — ED Triage Notes (Addendum)
Pt here for flu like symptoms. sts had flu shot Tuesday. sts also sore throat

## 2016-03-27 DIAGNOSIS — R509 Fever, unspecified: Secondary | ICD-10-CM | POA: Diagnosis not present

## 2016-03-27 DIAGNOSIS — Z6823 Body mass index (BMI) 23.0-23.9, adult: Secondary | ICD-10-CM | POA: Diagnosis not present

## 2016-03-27 DIAGNOSIS — E0500 Thyrotoxicosis with diffuse goiter without thyrotoxic crisis or storm: Secondary | ICD-10-CM | POA: Diagnosis not present

## 2016-03-27 DIAGNOSIS — D72819 Decreased white blood cell count, unspecified: Secondary | ICD-10-CM | POA: Diagnosis not present

## 2016-03-27 NOTE — Telephone Encounter (Signed)
See message to be advised, Thanks! 

## 2016-03-27 NOTE — Telephone Encounter (Signed)
[facility] [phone]  Pt had labs done at [facility] he has had four days of fever of 102 and is tachycardic, they are sending over lab results they want to be sure that the meds we are rx could not be causing this.

## 2016-03-28 ENCOUNTER — Encounter (HOSPITAL_COMMUNITY): Payer: Self-pay | Admitting: *Deleted

## 2016-03-28 ENCOUNTER — Inpatient Hospital Stay (HOSPITAL_COMMUNITY)
Admission: EM | Admit: 2016-03-28 | Discharge: 2016-04-01 | DRG: 810 | Disposition: A | Discharge status: Home / Self Care | Payer: BLUE CROSS/BLUE SHIELD | Attending: Internal Medicine | Admitting: Internal Medicine

## 2016-03-28 DIAGNOSIS — D702 Other drug-induced agranulocytosis: Principal | ICD-10-CM | POA: Diagnosis present

## 2016-03-28 DIAGNOSIS — D72819 Decreased white blood cell count, unspecified: Secondary | ICD-10-CM

## 2016-03-28 DIAGNOSIS — Z87891 Personal history of nicotine dependence: Secondary | ICD-10-CM

## 2016-03-28 DIAGNOSIS — R509 Fever, unspecified: Secondary | ICD-10-CM

## 2016-03-28 DIAGNOSIS — Z79899 Other long term (current) drug therapy: Secondary | ICD-10-CM

## 2016-03-28 DIAGNOSIS — E0590 Thyrotoxicosis, unspecified without thyrotoxic crisis or storm: Secondary | ICD-10-CM | POA: Diagnosis present

## 2016-03-28 DIAGNOSIS — Z87442 Personal history of urinary calculi: Secondary | ICD-10-CM | POA: Diagnosis not present

## 2016-03-28 DIAGNOSIS — E0500 Thyrotoxicosis with diffuse goiter without thyrotoxic crisis or storm: Secondary | ICD-10-CM | POA: Diagnosis present

## 2016-03-28 DIAGNOSIS — R5081 Fever presenting with conditions classified elsewhere: Secondary | ICD-10-CM | POA: Diagnosis not present

## 2016-03-28 DIAGNOSIS — D709 Neutropenia, unspecified: Secondary | ICD-10-CM | POA: Diagnosis not present

## 2016-03-28 DIAGNOSIS — T382X5A Adverse effect of antithyroid drugs, initial encounter: Secondary | ICD-10-CM | POA: Diagnosis not present

## 2016-03-28 DIAGNOSIS — D704 Cyclic neutropenia: Secondary | ICD-10-CM

## 2016-03-28 HISTORY — DX: Neutropenia, unspecified: D70.9

## 2016-03-28 HISTORY — DX: Thyrotoxicosis with diffuse goiter without thyrotoxic crisis or storm: E05.00

## 2016-03-28 HISTORY — DX: Fever presenting with conditions classified elsewhere: R50.81

## 2016-03-28 HISTORY — DX: Decreased white blood cell count, unspecified: D72.819

## 2016-03-28 HISTORY — DX: Pneumonia, unspecified organism: J18.9

## 2016-03-28 HISTORY — DX: Personal history of urinary calculi: Z87.442

## 2016-03-28 LAB — CBC WITH DIFFERENTIAL/PLATELET
BASOS ABS: 0.1 10*3/uL (ref 0.0–0.1)
BASOS PCT: 3 %
Band Neutrophils: 0 %
Blasts: 0 %
EOS PCT: 4 %
Eosinophils Absolute: 0.1 10*3/uL (ref 0.0–0.7)
HCT: 39.8 % (ref 39.0–52.0)
Hemoglobin: 13.7 g/dL (ref 13.0–17.0)
LYMPHS ABS: 1.1 10*3/uL (ref 0.7–4.0)
Lymphocytes Relative: 64 %
MCH: 29.6 pg (ref 26.0–34.0)
MCHC: 34.4 g/dL (ref 30.0–36.0)
MCV: 86 fL (ref 78.0–100.0)
METAMYELOCYTES PCT: 0 %
MONO ABS: 0.5 10*3/uL (ref 0.1–1.0)
MONOS PCT: 29 %
MYELOCYTES: 0 %
NEUTROS ABS: 0 10*3/uL — AB (ref 1.7–7.7)
Neutrophils Relative %: 0 %
Other: 0 %
PLATELETS: 287 10*3/uL (ref 150–400)
Promyelocytes Absolute: 0 %
RBC: 4.63 MIL/uL (ref 4.22–5.81)
RDW: 12.8 % (ref 11.5–15.5)
WBC: 1.8 10*3/uL — ABNORMAL LOW (ref 4.0–10.5)
nRBC: 0 /100 WBC

## 2016-03-28 LAB — COMPREHENSIVE METABOLIC PANEL
ALT: 29 U/L (ref 17–63)
AST: 18 U/L (ref 15–41)
Albumin: 3.5 g/dL (ref 3.5–5.0)
Alkaline Phosphatase: 88 U/L (ref 38–126)
Anion gap: 7 (ref 5–15)
BUN: 11 mg/dL (ref 6–20)
CHLORIDE: 105 mmol/L (ref 101–111)
CO2: 24 mmol/L (ref 22–32)
CREATININE: 0.68 mg/dL (ref 0.61–1.24)
Calcium: 9.3 mg/dL (ref 8.9–10.3)
Glucose, Bld: 104 mg/dL — ABNORMAL HIGH (ref 65–99)
POTASSIUM: 4.1 mmol/L (ref 3.5–5.1)
SODIUM: 136 mmol/L (ref 135–145)
Total Bilirubin: 1.7 mg/dL — ABNORMAL HIGH (ref 0.3–1.2)
Total Protein: 6.4 g/dL — ABNORMAL LOW (ref 6.5–8.1)

## 2016-03-28 LAB — URINALYSIS, ROUTINE W REFLEX MICROSCOPIC
BILIRUBIN URINE: NEGATIVE
Glucose, UA: NEGATIVE mg/dL
Hgb urine dipstick: NEGATIVE
KETONES UR: 15 mg/dL — AB
LEUKOCYTES UA: NEGATIVE
NITRITE: NEGATIVE
PH: 7.5 (ref 5.0–8.0)
PROTEIN: NEGATIVE mg/dL
Specific Gravity, Urine: 1.02 (ref 1.005–1.030)

## 2016-03-28 LAB — I-STAT CG4 LACTIC ACID, ED: LACTIC ACID, VENOUS: 0.76 mmol/L (ref 0.5–1.9)

## 2016-03-28 LAB — TSH

## 2016-03-28 LAB — PATHOLOGIST SMEAR REVIEW: Path Review: REACTIVE

## 2016-03-28 MED ORDER — PIPERACILLIN-TAZOBACTAM 3.375 G IVPB
3.3750 g | Freq: Three times a day (TID) | INTRAVENOUS | Status: DC
  Administered 2016-03-28 – 2016-04-01 (×11): 3.375 g via INTRAVENOUS
  Filled 2016-03-28 (×14): qty 50

## 2016-03-28 MED ORDER — SODIUM CHLORIDE 0.9 % IV BOLUS (SEPSIS)
1000.0000 mL | Freq: Once | INTRAVENOUS | Status: AC
  Administered 2016-03-28: 1000 mL via INTRAVENOUS

## 2016-03-28 MED ORDER — ACETAMINOPHEN 650 MG RE SUPP: 650.0000 mg | Freq: Four times a day (QID) | RECTAL | Status: DC | PRN

## 2016-03-28 MED ORDER — ENOXAPARIN SODIUM 40 MG/0.4ML ~~LOC~~ SOLN
40.0000 mg | SUBCUTANEOUS | Status: DC
  Administered 2016-03-28 – 2016-03-31 (×4): 40 mg via SUBCUTANEOUS
  Filled 2016-03-28 (×4): qty 0.4

## 2016-03-28 MED ORDER — PIPERACILLIN-TAZOBACTAM 3.375 G IVPB 30 MIN
3.3750 g | Freq: Once | INTRAVENOUS | Status: AC
  Administered 2016-03-28: 3.375 g via INTRAVENOUS
  Filled 2016-03-28: qty 50

## 2016-03-28 MED ORDER — ONDANSETRON HCL 4 MG/2ML IJ SOLN: 4.0000 mg | Freq: Four times a day (QID) | INTRAMUSCULAR | Status: DC | PRN

## 2016-03-28 MED ORDER — SENNOSIDES-DOCUSATE SODIUM 8.6-50 MG PO TABS
1.0000 | ORAL_TABLET | Freq: Two times a day (BID) | ORAL | Status: DC
  Administered 2016-03-28: 1 via ORAL
  Filled 2016-03-28 (×4): qty 1

## 2016-03-28 MED ORDER — ACETAMINOPHEN 325 MG PO TABS
650.0000 mg | ORAL_TABLET | Freq: Four times a day (QID) | ORAL | Status: DC | PRN
  Administered 2016-03-28 – 2016-03-31 (×6): 650 mg via ORAL
  Filled 2016-03-28 (×6): qty 2

## 2016-03-28 MED ORDER — PHENOL 1.4 % MT LIQD
1.0000 | OROMUCOSAL | Status: DC | PRN
  Administered 2016-03-28 – 2016-03-29 (×3): 1 via OROMUCOSAL
  Filled 2016-03-28: qty 177

## 2016-03-28 MED ORDER — ACETAMINOPHEN 650 MG RE SUPP
650.0000 mg | Freq: Four times a day (QID) | RECTAL | Status: DC | PRN
Start: 2016-03-28 — End: 2016-03-28

## 2016-03-28 MED ORDER — SODIUM CHLORIDE 0.9 % IV SOLN
INTRAVENOUS | Status: AC
  Administered 2016-03-28: 13:00:00 via INTRAVENOUS

## 2016-03-28 MED ORDER — ENSURE ENLIVE PO LIQD: 237.0000 mL | Freq: Two times a day (BID) | ORAL | Status: DC

## 2016-03-28 MED ORDER — HYDROCODONE-ACETAMINOPHEN 5-325 MG PO TABS
1.0000 | ORAL_TABLET | ORAL | Status: DC | PRN
  Administered 2016-03-29 – 2016-03-31 (×6): 1 via ORAL
  Filled 2016-03-28 (×6): qty 1

## 2016-03-28 MED ORDER — ZOLPIDEM TARTRATE 5 MG PO TABS
5.0000 mg | ORAL_TABLET | Freq: Every evening | ORAL | Status: DC | PRN
  Administered 2016-03-28 – 2016-03-31 (×2): 5 mg via ORAL
  Filled 2016-03-28 (×2): qty 1

## 2016-03-28 MED ORDER — VANCOMYCIN HCL IN DEXTROSE 1-5 GM/200ML-% IV SOLN
1000.0000 mg | Freq: Once | INTRAVENOUS | Status: AC
  Administered 2016-03-28: 1000 mg via INTRAVENOUS
  Filled 2016-03-28: qty 200

## 2016-03-28 MED ORDER — ACETAMINOPHEN 325 MG PO TABS: 650.0000 mg | ORAL_TABLET | Freq: Four times a day (QID) | ORAL | Status: DC | PRN

## 2016-03-28 MED ORDER — VANCOMYCIN HCL IN DEXTROSE 1-5 GM/200ML-% IV SOLN
1000.0000 mg | Freq: Three times a day (TID) | INTRAVENOUS | Status: DC
  Administered 2016-03-28 – 2016-04-01 (×11): 1000 mg via INTRAVENOUS
  Filled 2016-03-28 (×14): qty 200

## 2016-03-28 MED ORDER — ONDANSETRON HCL 4 MG PO TABS: 4.0000 mg | ORAL_TABLET | Freq: Four times a day (QID) | ORAL | Status: DC | PRN

## 2016-03-28 MED ORDER — SENNOSIDES-DOCUSATE SODIUM 8.6-50 MG PO TABS: 1.0000 | ORAL_TABLET | Freq: Every evening | ORAL | Status: DC | PRN

## 2016-03-28 MED ORDER — ACETAMINOPHEN 325 MG PO TABS
650.0000 mg | ORAL_TABLET | Freq: Once | ORAL | Status: AC
  Administered 2016-03-28: 650 mg via ORAL
  Filled 2016-03-28: qty 2

## 2016-03-28 MED ORDER — MENTHOL 3 MG MT LOZG
1.0000 | LOZENGE | OROMUCOSAL | Status: DC | PRN
  Filled 2016-03-28: qty 9

## 2016-03-28 NOTE — Progress Notes (Addendum)
Pharmacy Antibiotic Note  Lance [name] is a 50 y.o. male admitted on 03/28/2016 with sepsis.  Pharmacy has been consulted for vancomycin and zosyn dosing.  Wt 72.6 kg, WBC 1.8, ANC 0 - neutropenic, creat 0.68, lactate WNL. AF.  Pt told to come to ED by PCP b/c WBC too low at office.  Recent diagnosis of Graves dz and started on methimazole.  Pt c/o HA, fevers, dizziness, N, trouble sleeping, trouble swallowing.  Recent flu and strep test negative. Temp at home as high at 102.8. This am temp 101 and pt took tylenol. CXR negative yesterday. Got flu vaccine 10/31.  Plan: Vancomycin 1000 IV every 8 hours.  Goal trough 15-20 mcg/mL. Zosyn 3.375g IV q8h (4 hour infusion).  F/u renal fxn, wbc, temp, culture data Vancomycin levels as needed  Height: 5\' 9"  (175.3 cm) Weight: 160 lb (72.6 kg) IBW/kg (Calculated) : 70.7  Temp (24hrs), Avg:98.7 F (37.1 C), Min:98.6 F (37 C), Max:98.7 F (37.1 C)   Recent Labs Lab 03/28/16 1018 03/28/16 1029  WBC 1.8*  --   CREATININE 0.68  --   LATICACIDVEN  --  0.76    Estimated Creatinine Clearance: 109.2 mL/min (by C-G formula based on SCr of 0.68 mg/dL).    No Known Allergies  Antimicrobials this admission: vanc 11/7>> Zosyn 11/7>>  Dose adjustments this admission:   Microbiology results: 11/7 Ucx>> 11/7 BCx2>>   Thank you for allowing pharmacy to be a part of this patient's care.  Eudelia Bunch, Pharm.D. [id number] 03/28/2016 1:05 PM

## 2016-03-28 NOTE — ED Notes (Signed)
Attempted report x 2 

## 2016-03-28 NOTE — Telephone Encounter (Signed)
Results received and placed on your desk to review.

## 2016-03-28 NOTE — ED Notes (Signed)
Attempted report x1. Will call RN back in 5 mins at [phone].

## 2016-03-28 NOTE — ED Notes (Signed)
Brought patient back to room with family in tow; patient undressed, in gown, on monitor, continuous pulse oximetry and blood pressure cuff; visitor at bedside 

## 2016-03-28 NOTE — Telephone Encounter (Signed)
I contacted the patient and advised of the message. Pateint verbalized understanding and had no further questions at this time.

## 2016-03-28 NOTE — H&P (Signed)
History and Physical    Lance [name] [id number] DOB: [birth year] DOA: 03/28/2016  PCP: Velna Hatchet, MD Patient coming from: home  Chief Complaint: fever/sore throat  HPI: Lance [name] is a very pleasant 50 y.o. male with medical history significant graves disease recent diagnosis presents to the emergency department as directed by his endocrinologist for a low white blood cell count. Initial evaluation reveals neutropenic fever, leukopenia.  Information is obtained from the patient and his wife who is at the bedside. He states over the last 5-7 days he's experienced intermittent flulike symptoms. He describes a sore throat generalized achiness fever of 102, nonproductive cough. He states 2 days ago he went to urgent care where he had a negative strep test and was instructed to "wait 2 days" and if not better go to his primary care. He states yesterday he spiked a temp did not feel better so he went to his primary care. Reportedly he had a chest x-ray flu swab that were negative. Reportedly his white blood cell count was 2.0 yesterday. Today he received a phone call from endocrinologist who instructed him to stop methimazole immediately and go to the emergency department.. Associated symptoms include headache. He denies dizziness syncope or near-syncope. He denies chest pain palpitation shortness of breath lower extremity edema. He denies abdominal pain nausea vomiting diarrhea. He does report his been 2 days since his last bowel movement. He denies dysuria hematuria frequency or urgency.  ED Course: In the emergency department he's afebrile hemodynamically stable and not hypoxic he is provided with IV fluids and broad-spectrum antibiotics.  Review of Systems: As per HPI otherwise 10 point review of systems negative.   Ambulatory Status: Ambulates independently no recent falls  Past Medical History:  Diagnosis Date  . Graves disease   . Hyperthyroidism   . Leukopenia   . Neutropenic  fever ([place])     Past Surgical History:  Procedure Laterality Date  . CLAVICLE SURGERY     has plates and 075-GRM years ago  . kidney stones     had lithrotripsy    Social History   Social History  . Marital status: Married    Spouse name: N/A  . Number of children: N/A  . Years of education: N/A   Occupational History  . Not on file.[name] employed full-time as a Furniture conservator/restorer    Social History Main Topics  . Smoking status: Former Smoker    Packs/day: 0.50    Types: Cigarettes    Quit date: 09/20/2014  . Smokeless tobacco: Never Used     Comment: Quit 3 months ago  . Alcohol use No  . Drug use: No  . Sexual activity: Not on file   Other Topics Concern  . Not on file   Social History Narrative  . No narrative on file. Lives at home with his wife. He stopped smoking 4 years ago stopped drinking 2 years ago     No Known Allergies  Family History  Problem Relation Age of Onset  . Bladder Cancer Father   . Colon cancer Neg Hx   . Esophageal cancer Neg Hx   . Prostate cancer Neg Hx   . Rectal cancer Neg Hx   . Stomach cancer Neg Hx   . Thyroid disease Neg Hx     Prior to Admission medications   Medication Sig Start Date End Date Taking? Authorizing Provider  acetaminophen (TYLENOL) 650 MG CR tablet Take 1,300 mg by mouth every 8 (eight) hours  as needed for pain.   Yes Historical Provider, MD  ibuprofen (ADVIL,MOTRIN) 200 MG tablet Take 200 mg by mouth every 6 (six) hours as needed for fever.   Yes Historical Provider, MD  methimazole (TAPAZOLE) 10 MG tablet Take 2 tablets (20 mg total) by mouth 2 (two) times daily. 03/07/16  Yes Renato Shin, MD    Physical Exam: Vitals:   03/28/16 1115 03/28/16 1130 03/28/16 1132 03/28/16 1145  BP: 120/78 131/85 131/85 129/84  Pulse: 88 92 86 92  Resp: 21 21 18 21   Temp:      TempSrc:      SpO2: 98% 98% 99% 99%  Weight:      Height:         General:  Appears calm and comfortable, face slightly flushed very warm to  touch Eyes:  PERRL, EOMI, normal lids, iris ENT:  grossly normal hearing, lips & tongue, mucous membranes of his mouth are pink slightly dry Neck:  no LAD, masses or thyromegaly Cardiovascular:  RRR, no m/r/g. No LE edema.  Respiratory:  CTA bilaterally, no w/r/r. Normal respiratory effort. Abdomen:  soft, ntnd, positive bowel sounds but sluggish no guarding or rebounding Skin:  no rash or induration seen on limited exam Musculoskeletal:  grossly normal tone BUE/BLE, good ROM, no bony abnormality Psychiatric:  grossly normal mood and affect, speech fluent and appropriate, AOx3 Neurologic:  CN 2-12 grossly intact, moves all extremities in coordinated fashion, sensation intact  Labs on Admission: I have personally reviewed following labs and imaging studies  CBC:  Recent Labs Lab 03/28/16 1018  WBC 1.8*  NEUTROABS 0.0*  HGB 13.7  HCT 39.8  MCV 86.0  PLT [id number]   Basic Metabolic Panel:  Recent Labs Lab 03/28/16 1018  NA 136  K 4.1  CL 105  CO2 24  GLUCOSE 104*  BUN 11  CREATININE 0.68  CALCIUM 9.3   GFR: Estimated Creatinine Clearance: 109.2 mL/min (by C-G formula based on SCr of 0.68 mg/dL). Liver Function Tests:  Recent Labs Lab 03/28/16 1018  AST 18  ALT 29  ALKPHOS 88  BILITOT 1.7*  PROT 6.4*  ALBUMIN 3.5   No results for input(s): LIPASE, AMYLASE in the last 168 hours. No results for input(s): AMMONIA in the last 168 hours. Coagulation Profile: No results for input(s): INR, PROTIME in the last 168 hours. Cardiac Enzymes: No results for input(s): CKTOTAL, CKMB, CKMBINDEX, TROPONINI in the last 168 hours. BNP (last 3 results) No results for input(s): PROBNP in the last 8760 hours. HbA1C: No results for input(s): HGBA1C in the last 72 hours. CBG: No results for input(s): GLUCAP in the last 168 hours. Lipid Profile: No results for input(s): CHOL, HDL, LDLCALC, TRIG, CHOLHDL, LDLDIRECT in the last 72 hours. Thyroid Function Tests:  Recent Labs   03/28/16 1018  TSH <0.010*   Anemia Panel: No results for input(s): VITAMINB12, FOLATE, FERRITIN, TIBC, IRON, RETICCTPCT in the last 72 hours. Urine analysis:    Component Value Date/Time   COLORURINE YELLOW 03/08/2008 2348   APPEARANCEUR CLOUDY (A) 03/08/2008 2348   LABSPEC 1.028 03/08/2008 2348   PHURINE 6.0 03/08/2008 2348   GLUCOSEU NEGATIVE 03/08/2008 2348   HGBUR LARGE (A) 03/08/2008 2348   BILIRUBINUR NEGATIVE 03/08/2008 2348   KETONESUR 40 (A) 03/08/2008 2348   PROTEINUR NEGATIVE 03/08/2008 2348   UROBILINOGEN 0.2 03/08/2008 2348   NITRITE NEGATIVE 03/08/2008 2348   LEUKOCYTESUR NEGATIVE 03/08/2008 2348    Creatinine Clearance: Estimated Creatinine Clearance: 109.2 mL/min (by C-G formula  based on SCr of 0.68 mg/dL).  Sepsis Labs: @LABRCNTIP (procalcitonin:4,lacticidven:4) ) Recent Results (from the past 240 hour(s))  Culture, group A strep     Status: None (Preliminary result)   Collection Time: 03/26/16  4:41 PM  Result Value Ref Range Status   Specimen Description THROAT  Final   Special Requests NONE  Final   Culture CULTURE REINCUBATED FOR BETTER GROWTH  Final   Report Status PENDING  Incomplete     Radiological Exams on Admission: No results found.  EKG:   Assessment/Plan Principal Problem:   Neutropenic fever (HCC) Active Problems:   Hyperthyroidism   Leukopenia   #1. Neutropenic fever. Presumably related to recent methimazole. WBCs 1.8, neutrophils 0. Reported fever of 102. Lactic acid within the limits of normal hemodynamically stable and nontoxic appearing. Chest x-ray yesterday reportedly unremarkable flu panel negative strep swab negative at PCPs office yesterday -Admit to MedSurg -Gentle IV fluids -Stop methimazole -Follow blood cultures -Follow urine cultures -neutropenic precautions -Vitals every 43 -CBC with differential in the a.m. -Continue broad-spectrum antibiotics initiated in the emergency department -Monitor  #2.  Hyperthyroidism. Chart review indicates TSH panel yesterday yields TSH undetectable. Recent T4 1.83 -Hold home medications as noted above -Monitor  #3. Leukopenia. WBCs 1.8. See #1 -Monitor -CBC in the a.m.  DVT prophylaxis: scd  Code Status: full  Family Communication: wife at bedside  Disposition Plan: home hopefully 24 hours  Consults called: none  Admission status: obs    Radene Gunning MD [facility]  If 7PM-7AM, please contact night-coverage [email or web address] Password [email or web address]  03/28/2016, 1:29 PM

## 2016-03-28 NOTE — Telephone Encounter (Signed)
please call patient: I got results.  Your white blood cells are low.  This could be something serious D/c methimazole. Go to ER now.

## 2016-03-28 NOTE — ED Provider Notes (Signed)
[facility] DEPT Provider Note   CSN: [id number] Arrival date & time: 03/28/16  0910     History   Chief Complaint No chief complaint on file.   HPI Lance [name] is a 50 y.o. male.  Patients with history of Graves' disease, on methimazole, under the care of Dr. Loanne Drilling of [facility] endocrinology -- presents with low white blood cell count as well as flulike symptoms for the past 1 week. Patient has had sore throat, muscle aches, fevers to 102F, cough. He was seen at urgent care 2 days ago and had a negative strep test. He saw his primary care physician yesterday who checked a chest x-ray, labs, flu swab. Chest x-ray and flu swab reportedly negative. Also rechecked thyroid panel. TSH was undetectable. White blood cell count was 2.0. Patient was sent to the emergency department by his endocrinologist today because of the symptoms. Patient was told to discontinue the methimazole immediately. The onset of this condition was acute. The course is constant. Aggravating factors: none. Alleviating factors: none.        Past Medical History:  Diagnosis Date  . Hyperthyroidism     Patient Active Problem List   Diagnosis Date Noted  . Hyperthyroidism   . Abnormal prostate exam 02/10/2015    Past Surgical History:  Procedure Laterality Date  . CLAVICLE SURGERY     has plates and 075-GRM years ago  . kidney stones     had lithrotripsy       Home Medications    Prior to Admission medications   Medication Sig Start Date End Date Taking? Authorizing Provider  methimazole (TAPAZOLE) 10 MG tablet Take 2 tablets (20 mg total) by mouth 2 (two) times daily. 03/07/16   Renato Shin, MD    Family History Family History  Problem Relation Age of Onset  . Colon cancer Neg Hx   . Esophageal cancer Neg Hx   . Prostate cancer Neg Hx   . Rectal cancer Neg Hx   . Stomach cancer Neg Hx   . Thyroid disease Neg Hx     Social History Social History  Substance Use Topics  . Smoking  status: Former Smoker    Packs/day: 0.50    Types: Cigarettes    Quit date: 09/20/2014  . Smokeless tobacco: Never Used     Comment: Quit 3 months ago  . Alcohol use No     Allergies   Patient has no known allergies.   Review of Systems Review of Systems  Constitutional: Positive for fever.  HENT: Positive for congestion and sore throat. Negative for rhinorrhea.   Eyes: Negative for redness.  Respiratory: Negative for cough and shortness of breath.   Cardiovascular: Negative for chest pain.  Gastrointestinal: Negative for abdominal pain, diarrhea, nausea and vomiting.  Genitourinary: Negative for dysuria.  Musculoskeletal: Positive for myalgias. Negative for neck pain.  Skin: Negative for rash.  Neurological: Negative for headaches.     Physical Exam Updated Vital Signs BP 123/79 (BP Location: Left Arm)   Pulse 106   Temp 98.6 F (37 C) (Oral)   Resp 14   Ht 5\' 9"  (1.753 m)   Wt 72.6 kg   SpO2 97%   BMI 23.63 kg/m   Physical Exam  Constitutional: He appears well-developed and well-nourished.  HENT:  Head: Normocephalic and atraumatic.  Eyes: Conjunctivae are normal. Right eye exhibits no discharge. Left eye exhibits no discharge.  Neck: Normal range of motion. Neck supple.  Cardiovascular: Normal rate, regular rhythm  and normal heart sounds.   Pulmonary/Chest: Effort normal and breath sounds normal.  Abdominal: Soft. There is no tenderness.  Neurological: [name] alert.  Skin: Skin is warm and dry.  Psychiatric: He has a normal mood and affect.  Nursing note and vitals reviewed.    ED Treatments / Results  Labs (all labs ordered are listed, but only abnormal results are displayed) Labs Reviewed  CBC WITH DIFFERENTIAL/PLATELET - Abnormal; Notable for the following:       Result Value   WBC 1.8 (*)    Neutro Abs 0.0 (*)    All other components within normal limits  COMPREHENSIVE METABOLIC PANEL - Abnormal; Notable for the following:    Glucose, Bld 104  (*)    Total Protein 6.4 (*)    Total Bilirubin 1.7 (*)    All other components within normal limits  TSH - Abnormal; Notable for the following:    TSH <0.010 (*)    All other components within normal limits  CULTURE, BLOOD (ROUTINE X 2)  CULTURE, BLOOD (ROUTINE X 2)  URINE CULTURE  PATHOLOGIST SMEAR REVIEW  URINALYSIS, ROUTINE W REFLEX MICROSCOPIC (NOT AT [facility])  I-STAT CG4 LACTIC ACID, ED    Procedures Procedures (including critical care time)  Medications Ordered in ED Medications  sodium chloride 0.9 % bolus 1,000 mL (1,000 mLs Intravenous New Bag/Given 03/28/16 1018)     Initial Impression / Assessment and Plan / ED Course  I have reviewed the triage vital signs and the nursing notes.  Pertinent labs & imaging results that were available during my care of the patient were reviewed by me and considered in my medical decision making (see chart for details).  Clinical Course    Patient seen and examined. Work-up initiated. Medications ordered.   Vital signs reviewed and are as follows: BP 129/84   Pulse 92   Temp 98.6 F (37 C) (Oral)   Resp 21   Ht 5\' 9"  (1.753 m)   Wt 72.6 kg   SpO2 99%   BMI 23.63 kg/m   12:37 PM Pt d/w and seen by Dr. Rex Kras. Discussed case with Dr. Loanne Drilling who is aware patient to be admitted. Given agranulocytosis -- blood cultures, urine culture sent, broad spectrum antibiotics ordered.   Will admit. Pt aware.   12:52 PM Spoke with Dyanne Carrel who will see.   Final Clinical Impressions(s) / ED Diagnoses   Final diagnoses:  Acquired agranulocytosis ([place])  Fever, unspecified fever cause   Admit.   New Prescriptions New Prescriptions   No medications on file     Carlisle Cater, PA-C 03/28/16 [place], MD 04/01/16 [phone]

## 2016-03-28 NOTE — ED Triage Notes (Signed)
Patient comes in today after receiving a call from his PCP stating his WBC is too low, 2.00 K/uL at PCP office. Patient had recent diagnosis of graves disease in Sept and was prescribed Methimazole. Patient has been taking the medication the past month. The methimazole dose was increase mid Oct d/t his levels being to high according to his endocrinologist. Patient is here now with complaints of h/a, fevers, dizziness, nausea, trouble sleeping, trouble swallowing. Patient's recent flu and strep test were negative. CXR yesterday was negative. He does not c/o any urinary or abdominal issues. Temp at home has gotten as high as 102.8. This AM his temp was 101 and patient took 650 mg tylenol. Temp currently is 98.6.

## 2016-03-28 NOTE — Telephone Encounter (Signed)
I need results, thanks.

## 2016-03-29 ENCOUNTER — Observation Stay (HOSPITAL_COMMUNITY): Payer: BLUE CROSS/BLUE SHIELD

## 2016-03-29 DIAGNOSIS — R5081 Fever presenting with conditions classified elsewhere: Secondary | ICD-10-CM | POA: Diagnosis not present

## 2016-03-29 DIAGNOSIS — D704 Cyclic neutropenia: Secondary | ICD-10-CM

## 2016-03-29 DIAGNOSIS — Z87442 Personal history of urinary calculi: Secondary | ICD-10-CM | POA: Diagnosis not present

## 2016-03-29 DIAGNOSIS — D702 Other drug-induced agranulocytosis: Secondary | ICD-10-CM | POA: Diagnosis present

## 2016-03-29 DIAGNOSIS — E0500 Thyrotoxicosis with diffuse goiter without thyrotoxic crisis or storm: Secondary | ICD-10-CM | POA: Diagnosis present

## 2016-03-29 DIAGNOSIS — T382X5A Adverse effect of antithyroid drugs, initial encounter: Secondary | ICD-10-CM | POA: Diagnosis present

## 2016-03-29 DIAGNOSIS — E0590 Thyrotoxicosis, unspecified without thyrotoxic crisis or storm: Secondary | ICD-10-CM | POA: Diagnosis not present

## 2016-03-29 DIAGNOSIS — D709 Neutropenia, unspecified: Secondary | ICD-10-CM

## 2016-03-29 DIAGNOSIS — Z87891 Personal history of nicotine dependence: Secondary | ICD-10-CM | POA: Diagnosis not present

## 2016-03-29 DIAGNOSIS — R509 Fever, unspecified: Secondary | ICD-10-CM | POA: Diagnosis not present

## 2016-03-29 DIAGNOSIS — Z79899 Other long term (current) drug therapy: Secondary | ICD-10-CM | POA: Diagnosis not present

## 2016-03-29 LAB — CBC WITH DIFFERENTIAL/PLATELET
BASOS ABS: 0 10*3/uL (ref 0.0–0.1)
BASOS PCT: 1 %
Band Neutrophils: 0 %
Blasts: 0 %
EOS ABS: 0.1 10*3/uL (ref 0.0–0.7)
EOS PCT: 4 %
HCT: 37.4 % — ABNORMAL LOW (ref 39.0–52.0)
Hemoglobin: 12.7 g/dL — ABNORMAL LOW (ref 13.0–17.0)
LYMPHS ABS: 1.3 10*3/uL (ref 0.7–4.0)
Lymphocytes Relative: 68 %
MCH: 29.2 pg (ref 26.0–34.0)
MCHC: 34 g/dL (ref 30.0–36.0)
MCV: 86 fL (ref 78.0–100.0)
METAMYELOCYTES PCT: 0 %
MONO ABS: 0.5 10*3/uL (ref 0.1–1.0)
MYELOCYTES: 0 %
Monocytes Relative: 27 %
Neutro Abs: 0 10*3/uL — ABNORMAL LOW (ref 1.7–7.7)
Neutrophils Relative %: 0 %
Other: 0 %
PLATELETS: 311 10*3/uL (ref 150–400)
Promyelocytes Absolute: 0 %
RBC: 4.35 MIL/uL (ref 4.22–5.81)
RDW: 12.8 % (ref 11.5–15.5)
WBC: 1.9 10*3/uL — AB (ref 4.0–10.5)
nRBC: 0 /100 WBC

## 2016-03-29 LAB — CULTURE, GROUP A STREP (THRC)

## 2016-03-29 LAB — RESPIRATORY PANEL BY PCR
ADENOVIRUS-RVPPCR: NOT DETECTED
Bordetella pertussis: NOT DETECTED
CORONAVIRUS 229E-RVPPCR: NOT DETECTED
CORONAVIRUS NL63-RVPPCR: NOT DETECTED
CORONAVIRUS OC43-RVPPCR: NOT DETECTED
Chlamydophila pneumoniae: NOT DETECTED
Coronavirus HKU1: NOT DETECTED
INFLUENZA B-RVPPCR: NOT DETECTED
Influenza A: NOT DETECTED
MYCOPLASMA PNEUMONIAE-RVPPCR: NOT DETECTED
Metapneumovirus: NOT DETECTED
PARAINFLUENZA VIRUS 1-RVPPCR: NOT DETECTED
Parainfluenza Virus 2: NOT DETECTED
Parainfluenza Virus 3: NOT DETECTED
Parainfluenza Virus 4: NOT DETECTED
Respiratory Syncytial Virus: NOT DETECTED
Rhinovirus / Enterovirus: NOT DETECTED

## 2016-03-29 LAB — URINE CULTURE: Culture: NO GROWTH

## 2016-03-29 LAB — BASIC METABOLIC PANEL
Anion gap: 11 (ref 5–15)
BUN: 8 mg/dL (ref 6–20)
CHLORIDE: 102 mmol/L (ref 101–111)
CO2: 21 mmol/L — AB (ref 22–32)
CREATININE: 0.7 mg/dL (ref 0.61–1.24)
Calcium: 9 mg/dL (ref 8.9–10.3)
GFR calc Af Amer: >60 mL/min (ref >60)
GFR calc non Af Amer: >60 mL/min (ref >60)
Glucose, Bld: 122 mg/dL — ABNORMAL HIGH (ref 65–99)
Potassium: 3.6 mmol/L (ref 3.5–5.1)
SODIUM: 134 mmol/L — AB (ref 135–145)

## 2016-03-29 LAB — T4, FREE: FREE T4: 1.21 ng/dL — AB (ref 0.61–1.12)

## 2016-03-29 LAB — PROCALCITONIN: Procalcitonin: 0.13 ng/mL

## 2016-03-29 MED ORDER — TBO-FILGRASTIM 300 MCG/0.5ML ~~LOC~~ SOSY
300.0000 ug | PREFILLED_SYRINGE | Freq: Once | SUBCUTANEOUS | Status: AC
  Administered 2016-03-29: 300 ug via SUBCUTANEOUS
  Filled 2016-03-29 (×2): qty 0.5

## 2016-03-29 NOTE — Progress Notes (Signed)
C/o gums being sore when chewing, denies any bleeding of gums.

## 2016-03-29 NOTE — Progress Notes (Signed)
Nutrition Brief Note  Patient identified on the Malnutrition Screening Tool (MST) Report  Wt Readings from Last 15 Encounters:  03/28/16 162 lb 7.7 oz (73.7 kg)  03/06/16 169 lb (76.7 kg)  02/10/15 162 lb (73.5 kg)  01/12/15 162 lb 6.4 oz (73.7 kg)  05/20/14 155 lb (70.3 kg)   Lance [name] is a very pleasant 50 y.o. male with medical history significant graves disease recent diagnosis presents to the emergency department as directed by his endocrinologist for a low white blood cell count. Initial evaluation reveals neutropenic fever, leukopenia.  Pt in with multiple healthcare providers at time of visit.   Case discussed with RN, who reports pt with poor po intake PTA related to sore throat. Pt's throat pain has subsided and ate very well at breakfast.   Reviewed wt hx; UBW around 162#. No significant wt changes for time frames.   Body mass index is 24.7 kg/m. Patient meets criteria for normal based on current BMI.   Current diet order is regular, patient is consuming approximately 100% of meals at this time. Labs and medications reviewed.   No nutrition interventions warranted at this time. If nutrition issues arise, please consult RD.   Trameka Dorough A. Jimmye Norman, RD, LDN, CDE Pager: [phone] After hours Pager: [phone]

## 2016-03-29 NOTE — Progress Notes (Signed)
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 male with medical history significant graves disease recent diagnosis presents to the emergency department as directed by his endocrinologist for a low white blood cell count. Initial evaluation reveals neutropenic fever, leukopenia.Reportedly he had a chest x-ray flu swab that were negative. Reportedly his white blood cell count was 2.0 yesterday. Today he received a phone call from endocrinologist who instructed him to stop methimazole immediately and go to the emergency department  Assessment and plan #1. Neutropenic fever. Presumably related to recent methimazole. WBCs 1.8>1.9, neutrophils 0. Reported fever of 102. Lactic acid within the limits of normal hemodynamically stable and nontoxic appearing. Chest x-ray yesterday reportedly unremarkable flu panel negative strep swab negative at PCPs office yesterday Continue inpatient -Gentle IV fluids -Stop methimazole -Follow blood cultures -Follow urine cultures Respiratory virus panel -neutropenic precautions -Vitals every 43 -CBC with differential in the a.m. -Continue broad-spectrum antibiotics initiated in the emergency department grainex and hold offending agent , if no improvement may need hematology consultation Pro calcitonin, respiratory virus panel  #2. Hyperthyroidism.   TSH undetectable. Recent free T4 1.83 -Hold home medications as noted above -Monitor  #3. Leukopenia. WBCs 1.8. See #1 -CBC  with differential Administer granix and follow ANC     DVT prophylaxsis Lovenox  Code Status:  Full code    Family Communication: Discussed in detail with the patient, all imaging results, lab  results explained to the patient   Disposition Plan:  Anticipate discharge in 2-3 days     Consultants:  None  Procedures:  None  Antibiotics: Anti-infectives    Start     Dose/Rate Route Frequency Ordered Stop   03/28/16 2200  vancomycin (VANCOCIN) IVPB 1000 mg/200 mL premix     1,000 mg 200 mL/hr over 60 Minutes Intravenous Every 8 hours 03/28/16 1317     03/28/16 2000  piperacillin-tazobactam (ZOSYN) IVPB 3.375 g     3.375 g 12.5 mL/hr over 240 Minutes Intravenous Every 8 hours 03/28/16 1317     03/28/16 1230  piperacillin-tazobactam (ZOSYN) IVPB 3.375 g     3.375 g 100 mL/hr over 30 Minutes Intravenous  Once 03/28/16 1226 03/28/16 1422   03/28/16 1230  vancomycin (VANCOCIN) IVPB 1000 mg/200 mL premix     1,000 mg 200 mL/hr over 60 Minutes Intravenous  Once 03/28/16 1226 03/28/16 1427         HPI/Subjective: Continues to complain of sore throat, low-grade fever  Objective: Vitals:   03/28/16 1931 03/28/16 2100 03/28/16 2230 03/29/16 0530  BP:  (!) 123/59  122/82  Pulse:  (!) 107  (!) 106  Resp:  18  18  Temp: 100.3 F (37.9 C) (!) 101.1 F (38.4 C) 99.9 F (37.7 C) 99.7 F (37.6 C)  TempSrc: Oral Oral Oral Oral  SpO2:  96%  98%  Weight:      Height:        Intake/Output Summary (Last 24 hours) at 03/29/16 0908 Last data filed at 03/29/16 0602  Gross per 24 hour  Intake             1669 ml  Output  1150 ml  Net              519 ml    Exam:  Examination:  General exam: Oropharyngeal erythema Respiratory system: Clear to auscultation. Respiratory effort normal. Cardiovascular system: S1 & S2 heard, RRR. No JVD, murmurs, rubs, gallops or clicks. No pedal edema. Gastrointestinal system: Abdomen is nondistended, soft and nontender. No organomegaly or masses felt. Normal bowel sounds heard. Central nervous system: Alert and oriented. No focal neurological deficits. Extremities: Symmetric 5 x 5 power. Skin: No rashes, lesions or  ulcers Psychiatry: Judgement and insight appear normal. Mood & affect appropriate.     Data Reviewed: I have personally reviewed following labs and imaging studies  Micro Results Recent Results (from the past 240 hour(s))  Culture, group A strep     Status: None (Preliminary result)   Collection Time: 03/26/16  4:41 PM  Result Value Ref Range Status   Specimen Description THROAT  Final   Special Requests NONE  Final   Culture CULTURE REINCUBATED FOR BETTER GROWTH  Final   Report Status PENDING  Incomplete    Radiology Reports No results found.   CBC  Recent Labs Lab 03/28/16 1018 03/29/16 0653  WBC 1.8* 1.9*  HGB 13.7 12.7*  HCT 39.8 37.4*  PLT 287 311  MCV 86.0 86.0  MCH 29.6 29.2  MCHC 34.4 34.0  RDW 12.8 12.8  LYMPHSABS 1.1 PENDING  MONOABS 0.5 PENDING  EOSABS 0.1 PENDING  BASOSABS 0.1 PENDING    Chemistries   Recent Labs Lab 03/28/16 1018 03/29/16 0653  NA 136 134*  K 4.1 3.6  CL 105 102  CO2 24 21*  GLUCOSE 104* 122*  BUN 11 8  CREATININE 0.68 0.70  CALCIUM 9.3 9.0  AST 18  --   ALT 29  --   ALKPHOS 88  --   BILITOT 1.7*  --    ------------------------------------------------------------------------------------------------------------------ estimated creatinine clearance is 105.7 mL/min (by C-G formula based on SCr of 0.7 mg/dL). ------------------------------------------------------------------------------------------------------------------ No results for input(s): HGBA1C in the last 72 hours. ------------------------------------------------------------------------------------------------------------------ No results for input(s): CHOL, HDL, LDLCALC, TRIG, CHOLHDL, LDLDIRECT in the last 72 hours. ------------------------------------------------------------------------------------------------------------------  Recent Labs  03/28/16 1018  TSH <0.010*    ------------------------------------------------------------------------------------------------------------------ No results for input(s): VITAMINB12, FOLATE, FERRITIN, TIBC, IRON, RETICCTPCT in the last 72 hours.  Coagulation profile No results for input(s): INR, PROTIME in the last 168 hours.  No results for input(s): DDIMER in the last 72 hours.  Cardiac Enzymes No results for input(s): CKMB, TROPONINI, MYOGLOBIN in the last 168 hours.  Invalid input(s): CK ------------------------------------------------------------------------------------------------------------------ Invalid input(s): POCBNP   CBG: No results for input(s): GLUCAP in the last 168 hours.     Studies: No results found.    No results found for: HGBA1C Lab Results  Component Value Date   CREATININE 0.70 03/29/2016       Scheduled Meds: . enoxaparin (LOVENOX) injection  40 mg Subcutaneous Q24H  . feeding supplement (ENSURE ENLIVE)  237 mL Oral BID BM  . piperacillin-tazobactam (ZOSYN)  IV  3.375 g Intravenous Q8H  . senna-docusate  1 tablet Oral BID  . Tbo-filgastrim (GRANIX) SQ  300 mcg Subcutaneous ONCE-1800  . vancomycin  1,000 mg Intravenous Q8H   Continuous Infusions:   LOS: 0 days    Time spent: >30 MINS    [facility]  [facility] Pager [phone]. If 7PM-7AM, please contact night-coverage at [email or web address], password [facility] 03/29/2016, 9:08 AM  LOS: 0 days

## 2016-03-30 DIAGNOSIS — R509 Fever, unspecified: Secondary | ICD-10-CM

## 2016-03-30 LAB — COMPREHENSIVE METABOLIC PANEL
ALBUMIN: 3 g/dL — AB (ref 3.5–5.0)
ALT: 26 U/L (ref 17–63)
ANION GAP: 7 (ref 5–15)
AST: 16 U/L (ref 15–41)
Alkaline Phosphatase: 87 U/L (ref 38–126)
CHLORIDE: 104 mmol/L (ref 101–111)
CO2: 26 mmol/L (ref 22–32)
Calcium: 8.8 mg/dL — ABNORMAL LOW (ref 8.9–10.3)
Creatinine, Ser: 0.73 mg/dL (ref 0.61–1.24)
GFR calc Af Amer: >60 mL/min (ref >60)
GFR calc non Af Amer: >60 mL/min (ref >60)
GLUCOSE: 95 mg/dL (ref 65–99)
POTASSIUM: 3.3 mmol/L — AB (ref 3.5–5.1)
SODIUM: 137 mmol/L (ref 135–145)
Total Bilirubin: 1 mg/dL (ref 0.3–1.2)
Total Protein: 5.6 g/dL — ABNORMAL LOW (ref 6.5–8.1)

## 2016-03-30 LAB — CBC WITH DIFFERENTIAL/PLATELET
BAND NEUTROPHILS: 0 %
BASOS ABS: 0.1 10*3/uL (ref 0.0–0.1)
BASOS PCT: 3 %
Blasts: 0 %
EOS ABS: 0.2 10*3/uL (ref 0.0–0.7)
Eosinophils Relative: 6 %
HCT: 37.6 % — ABNORMAL LOW (ref 39.0–52.0)
Hemoglobin: 12.6 g/dL — ABNORMAL LOW (ref 13.0–17.0)
LYMPHS PCT: 72 %
Lymphs Abs: 2.5 10*3/uL (ref 0.7–4.0)
MCH: 28.8 pg (ref 26.0–34.0)
MCHC: 33.5 g/dL (ref 30.0–36.0)
MCV: 85.8 fL (ref 78.0–100.0)
METAMYELOCYTES PCT: 0 %
MONO ABS: 0.7 10*3/uL (ref 0.1–1.0)
MONOS PCT: 19 %
Myelocytes: 0 %
NEUTROS ABS: 0 10*3/uL — AB (ref 1.7–7.7)
Neutrophils Relative %: 0 %
OTHER: 0 %
PROMYELOCYTES ABS: 0 %
Platelets: 337 10*3/uL (ref 150–400)
RBC: 4.38 MIL/uL (ref 4.22–5.81)
RDW: 12.6 % (ref 11.5–15.5)
WBC: 3.5 10*3/uL — ABNORMAL LOW (ref 4.0–10.5)
nRBC: 0 /100 WBC

## 2016-03-30 MED ORDER — DOCUSATE SODIUM 100 MG PO CAPS
100.0000 mg | ORAL_CAPSULE | Freq: Every day | ORAL | Status: DC
  Filled 2016-03-30 (×2): qty 1

## 2016-03-30 MED ORDER — TBO-FILGRASTIM 300 MCG/0.5ML ~~LOC~~ SOSY
300.0000 ug | PREFILLED_SYRINGE | Freq: Once | SUBCUTANEOUS | Status: AC
  Administered 2016-03-30: 300 ug via SUBCUTANEOUS
  Filled 2016-03-30 (×2): qty 0.5

## 2016-03-30 NOTE — Progress Notes (Signed)
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 male with medical history significant graves disease recent diagnosis presents to the emergency department as directed by his endocrinologist for a low white blood cell count. Initial evaluation reveals neutropenic fever, leukopenia.Reportedly he had a chest x-ray flu swab that were negative. Reportedly his white blood cell count was 2.0 yesterday. Today he received a phone call from endocrinologist who instructed him to stop methimazole immediately and go to the emergency department  Assessment and plan #1. Neutropenic fever. Presumably related to recent methimazole. WBCs 1.8>1.9, neutrophils 0. Patient had a fever of 101 last night. Lactic acid within the limits of normal hemodynamically stable and nontoxic appearing. Chest x-ray yesterday reportedly unremarkable flu panel negative strep swab negative at PCPs office yesterday Continue inpatient , continue empiric antibiotics Discontinued methimazole -Follow blood cultures no growth so far, group B strep negative, repeat chest x-ray negative -Follow urine cultures no growth so far Respiratory virus panel negative -neutropenic precautions -Vitals every 43 -CBC with differential in the a.m. -Continue broad-spectrum antibiotics initiated in the emergency department Administer grainex for [place] less than thousand and hold offending agent ,  Discussed with hematology Dr Irene Limbo , he states that patient's with agranulocytosis could have fever , and we may not find an obvious source of infection, he recommends to continue empiric antibiotics until his [place] is greater than 1000    #2. Hyperthyroidism.   TSH undetectable.  Free T4 1.21 Patient would need to discuss further with his outpatient endocrinologist -Monitor  #3. Agranulocytosis -CBC  with differential , continues to show [place] to be 0 Administer granix and follow ANC , ANC needs to be greater than 1000 prior to discharge    DVT prophylaxsis Lovenox  Code Status:  Full code    Family Communication: Discussed in detail with the patient, all imaging results, lab results explained to the patient   Disposition Plan:  Anticipate discharge in 2-3 days     Consultants:  None  Procedures:  None  Antibiotics: Anti-infectives    Start     Dose/Rate Route Frequency Ordered Stop   03/28/16 2200  vancomycin (VANCOCIN) IVPB 1000 mg/200 mL premix     1,000 mg 200 mL/hr over 60 Minutes Intravenous Every 8 hours 03/28/16 1317     03/28/16 2000  piperacillin-tazobactam (ZOSYN) IVPB 3.375 g     3.375 g 12.5 mL/hr over 240 Minutes Intravenous Every 8 hours 03/28/16 1317     03/28/16 1230  piperacillin-tazobactam (ZOSYN) IVPB 3.375 g     3.375 g 100 mL/hr over 30 Minutes Intravenous  Once 03/28/16 1226 03/28/16 1422   03/28/16 1230  vancomycin (VANCOCIN) IVPB 1000 mg/200 mL premix     1,000 mg 200 mL/hr over 60 Minutes Intravenous  Once 03/28/16 1226 03/28/16 1427         HPI/Subjective: Patient feels much stronger, but continues to have low-grade fever denies any cough denies any chest pain, no nausea vomiting or diarrhea  Objective: Vitals:   03/29/16 2202 03/29/16 2307 03/30/16 0030 03/30/16 0534  BP: 127/68   131/75  Pulse: 88   79  Resp: 19   16  Temp: 100.2 F (37.9  C) (!) 101 F (38.3 C) 99.3 F (37.4 C) 98.2 F (36.8 C)  TempSrc: Oral   Oral  SpO2: 97%   95%  Weight:      Height:        Intake/Output Summary (Last 24 hours) at 03/30/16 0843 Last data filed at 03/29/16 1707  Gross per 24 hour  Intake              720 ml  Output              600 ml  Net              120 ml    Exam:  Examination:  General exam:  Oropharyngeal erythema Respiratory system: Clear to auscultation. Respiratory effort normal. Cardiovascular system: S1 & S2 heard, RRR. No JVD, murmurs, rubs, gallops or clicks. No pedal edema. Gastrointestinal system: Abdomen is nondistended, soft and nontender. No organomegaly or masses felt. Normal bowel sounds heard. Central nervous system: Alert and oriented. No focal neurological deficits. Extremities: Symmetric 5 x 5 power. Skin: No rashes, lesions or ulcers Psychiatry: Judgement and insight appear normal. Mood & affect appropriate.     Data Reviewed: I have personally reviewed following labs and imaging studies  Micro Results Recent Results (from the past 240 hour(s))  Culture, group A strep     Status: None   Collection Time: 03/26/16  4:41 PM  Result Value Ref Range Status   Specimen Description THROAT  Final   Special Requests NONE  Final   Culture NO GROUP A STREP (S.PYOGENES) ISOLATED  Final   Report Status 03/29/2016 FINAL  Final  Blood Culture (routine x 2)     Status: None (Preliminary result)   Collection Time: 03/28/16 12:36 PM  Result Value Ref Range Status   Specimen Description BLOOD RIGHT ANTECUBITAL  Final   Special Requests BOTTLES DRAWN AEROBIC AND ANAEROBIC  5CC  Final   Culture NO GROWTH < 24 HOURS  Final   Report Status PENDING  Incomplete  Blood Culture (routine x 2)     Status: None (Preliminary result)   Collection Time: 03/28/16 12:42 PM  Result Value Ref Range Status   Specimen Description BLOOD LEFT ANTECUBITAL  Final   Special Requests BOTTLES DRAWN AEROBIC AND ANAEROBIC  10CC  Final   Culture NO GROWTH < 24 HOURS  Final   Report Status PENDING  Incomplete  Urine culture     Status: None   Collection Time: 03/28/16  7:51 PM  Result Value Ref Range Status   Specimen Description URINE, RANDOM  Final   Special Requests NONE  Final   Culture NO GROWTH  Final   Report Status 03/29/2016 FINAL  Final  Respiratory Panel by PCR     Status: None    Collection Time: 03/29/16 11:07 AM  Result Value Ref Range Status   Adenovirus NOT DETECTED NOT DETECTED Final   Coronavirus 229E NOT DETECTED NOT DETECTED Final   Coronavirus HKU1 NOT DETECTED NOT DETECTED Final   Coronavirus NL63 NOT DETECTED NOT DETECTED Final   Coronavirus OC43 NOT DETECTED NOT DETECTED Final   Metapneumovirus NOT DETECTED NOT DETECTED Final   Rhinovirus / Enterovirus NOT DETECTED NOT DETECTED Final   Influenza A NOT DETECTED NOT DETECTED Final   Influenza B NOT DETECTED NOT DETECTED Final   Parainfluenza Virus 1 NOT DETECTED NOT DETECTED Final   Parainfluenza Virus 2 NOT DETECTED NOT DETECTED Final   Parainfluenza Virus 3 NOT DETECTED NOT DETECTED Final  Parainfluenza Virus 4 NOT DETECTED NOT DETECTED Final   Respiratory Syncytial Virus NOT DETECTED NOT DETECTED Final   Bordetella pertussis NOT DETECTED NOT DETECTED Final   Chlamydophila pneumoniae NOT DETECTED NOT DETECTED Final   Mycoplasma pneumoniae NOT DETECTED NOT DETECTED Final    Radiology Reports Dg Chest 2 View  Result Date: 03/29/2016 CLINICAL DATA:  Fever and headache for 5 days. EXAM: CHEST  2 VIEW COMPARISON:  03/18/2012 FINDINGS: Again noted is a surgical plate in the left clavicle. Both lungs are clear. Heart and mediastinum are within normal limits. The trachea is midline. Mild degenerative changes in lower thoracic spine. No pleural effusions. IMPRESSION: No active cardiopulmonary disease. Electronically Signed   By: Markus Daft M.D.   On: 03/29/2016 11:04     CBC  Recent Labs Lab 03/28/16 1018 03/29/16 0653 03/30/16 0517  WBC 1.8* 1.9* 3.5*  HGB 13.7 12.7* 12.6*  HCT 39.8 37.4* 37.6*  PLT 287 311 337  MCV 86.0 86.0 85.8  MCH 29.6 29.2 28.8  MCHC 34.4 34.0 33.5  RDW 12.8 12.8 12.6  LYMPHSABS 1.1 1.3 PENDING  MONOABS 0.5 0.5 PENDING  EOSABS 0.1 0.1 PENDING  BASOSABS 0.1 0.0 PENDING    Chemistries   Recent Labs Lab 03/28/16 1018 03/29/16 0653 03/30/16 0517  NA 136 134* 137   K 4.1 3.6 3.3*  CL 105 102 104  CO2 24 21* 26  GLUCOSE 104* 122* 95  BUN 11 8 <5*  CREATININE 0.68 0.70 0.73  CALCIUM 9.3 9.0 8.8*  AST 18  --  16  ALT 29  --  26  ALKPHOS 88  --  87  BILITOT 1.7*  --  1.0   ------------------------------------------------------------------------------------------------------------------ estimated creatinine clearance is 105.7 mL/min (by C-G formula based on SCr of 0.73 mg/dL). ------------------------------------------------------------------------------------------------------------------ No results for input(s): HGBA1C in the last 72 hours. ------------------------------------------------------------------------------------------------------------------ No results for input(s): CHOL, HDL, LDLCALC, TRIG, CHOLHDL, LDLDIRECT in the last 72 hours. ------------------------------------------------------------------------------------------------------------------  Recent Labs  03/28/16 1018  TSH <0.010*   ------------------------------------------------------------------------------------------------------------------ No results for input(s): VITAMINB12, FOLATE, FERRITIN, TIBC, IRON, RETICCTPCT in the last 72 hours.  Coagulation profile No results for input(s): INR, PROTIME in the last 168 hours.  No results for input(s): DDIMER in the last 72 hours.  Cardiac Enzymes No results for input(s): CKMB, TROPONINI, MYOGLOBIN in the last 168 hours.  Invalid input(s): CK ------------------------------------------------------------------------------------------------------------------ Invalid input(s): POCBNP   CBG: No results for input(s): GLUCAP in the last 168 hours.     Studies: Dg Chest 2 View  Result Date: 03/29/2016 CLINICAL DATA:  Fever and headache for 5 days. EXAM: CHEST  2 VIEW COMPARISON:  03/18/2012 FINDINGS: Again noted is a surgical plate in the left clavicle. Both lungs are clear. Heart and mediastinum are within normal limits.  The trachea is midline. Mild degenerative changes in lower thoracic spine. No pleural effusions. IMPRESSION: No active cardiopulmonary disease. Electronically Signed   By: Markus Daft M.D.   On: 03/29/2016 11:04      No results found for: HGBA1C Lab Results  Component Value Date   CREATININE 0.73 03/30/2016       Scheduled Meds: . docusate sodium  100 mg Oral Daily  . enoxaparin (LOVENOX) injection  40 mg Subcutaneous Q24H  . piperacillin-tazobactam (ZOSYN)  IV  3.375 g Intravenous Q8H  . senna-docusate  1 tablet Oral BID  . Tbo-filgastrim (GRANIX) SQ  300 mcg Subcutaneous ONCE-1800  . vancomycin  1,000 mg Intravenous Q8H   Continuous Infusions:   LOS:  1 day    Time spent: >30 MINS    [facility] Pager [phone]. If 7PM-7AM, please contact night-coverage at [email or web address], password [email or web address] 03/30/2016, 8:43 AM  LOS: 1 day

## 2016-03-30 NOTE — Care Management Note (Signed)
Case Management Note  Patient Details  Name: Lance [name] MRN: [id number] Date of Birth: March 10, 1966  Subjective/Objective:          Presents with neutropenic fevers likely from recent initiation of Tapazole.Recently  Diagnosis with Graves disease. From home with wife. Independent with ADL'S PTA, no DME usage.       Nadia Pizzoferrato (Spouse)     [phone]        PCP: Velna Hatchet  Action/Plan: Return to home when medically stable. CM to f/u with disposition needs.  Expected Discharge Date:                  Expected Discharge Plan:  Home/Self Care (Lives with wife, Wells Guiles)  In-House Referral:     Discharge planning Services  CM Consult  Post Acute Care Choice:    Choice offered to:     DME Arranged:    DME Agency:     HH Arranged:    HH Agency:     Status of Service:  In process, will continue to follow  If discussed at Long Length of Stay Meetings, dates discussed:    Additional Comments:  Sharin Mons, RN 03/30/2016, 2:27 PM

## 2016-03-31 LAB — CBC WITH DIFFERENTIAL/PLATELET
Basophils Absolute: 0.1 10*3/uL (ref 0.0–0.1)
Basophils Relative: 2 %
EOS ABS: 0.3 10*3/uL (ref 0.0–0.7)
Eosinophils Relative: 7 %
HCT: 36.2 % — ABNORMAL LOW (ref 39.0–52.0)
Hemoglobin: 12.5 g/dL — ABNORMAL LOW (ref 13.0–17.0)
Lymphocytes Relative: 52 %
Lymphs Abs: 1.9 10*3/uL (ref 0.7–4.0)
MCH: 29.4 pg (ref 26.0–34.0)
MCHC: 34.5 g/dL (ref 30.0–36.0)
MCV: 85.2 fL (ref 78.0–100.0)
MONO ABS: 1 10*3/uL (ref 0.1–1.0)
Monocytes Relative: 26 %
NEUTROS PCT: 13 %
Neutro Abs: 0.5 10*3/uL — ABNORMAL LOW (ref 1.7–7.7)
PLATELETS: 356 10*3/uL (ref 150–400)
RBC: 4.25 MIL/uL (ref 4.22–5.81)
RDW: 13 % (ref 11.5–15.5)
WBC: 3.8 10*3/uL — AB (ref 4.0–10.5)

## 2016-03-31 LAB — PROCALCITONIN: Procalcitonin: 0.1 ng/mL

## 2016-03-31 LAB — VANCOMYCIN, TROUGH: VANCOMYCIN TR: 16 ug/mL (ref 15–20)

## 2016-03-31 MED ORDER — TBO-FILGRASTIM 300 MCG/0.5ML ~~LOC~~ SOSY
300.0000 ug | PREFILLED_SYRINGE | Freq: Once | SUBCUTANEOUS | Status: AC
  Administered 2016-03-31: 300 ug via SUBCUTANEOUS
  Filled 2016-03-31 (×2): qty 0.5

## 2016-03-31 MED ORDER — POTASSIUM CHLORIDE CRYS ER 20 MEQ PO TBCR
40.0000 meq | EXTENDED_RELEASE_TABLET | Freq: Once | ORAL | Status: AC
  Administered 2016-03-31: 40 meq via ORAL
  Filled 2016-03-31: qty 2

## 2016-03-31 NOTE — Progress Notes (Signed)
Pharmacy Antibiotic Note  Lance [name] is a 50 y.o. male admitted on 03/28/2016 with sepsis and neutropenic fever. Pharmacy has been consulted for Vancomycin and zosyn dosing. Wt 73.7 kg, ANC 0.5 from 0 - neutropenic, renal fxn stable, lactate WNL. Afebrile. Per Hematology, recommends to continue empiric antibiotics until [place] is greater than 1000.  11/10 Vancomycin trough = 16 (within therapeutic range, drawn appropriately, 7 hrs from last dose)  Plan: -Continue Vancomycin 1000mg  IV every 8 hours.  -Continue Zosyn 3.375g IV q8h (4 hour infusion).  -Monitor renal fxn, CBC, temp, culture data -Vancomycin trough when clinically indicated   Height: 5\' 8"  (172.7 cm) Weight: 162 lb 7.7 oz (73.7 kg) IBW/kg (Calculated) : 68.4  Temp (24hrs), Avg:97.9 F (36.6 C), Min:97.3 F (36.3 C), Max:98.2 F (36.8 C)   Recent Labs Lab 03/28/16 1018 03/28/16 1029 03/29/16 0653 03/30/16 0517 03/31/16 0501  WBC 1.8*  --  1.9* 3.5* 3.8*  CREATININE 0.68  --  0.70 0.73  --   LATICACIDVEN  --  0.76  --   --   --   VANCOTROUGH  --   --   --   --  16    Estimated Creatinine Clearance: 105.7 mL/min (by C-G formula based on SCr of 0.73 mg/dL).    No Known Allergies  Antimicrobials this admission: Vancomycin 11/7 >>  Zosyn 11/7 >>   Dose adjustments this admission: n/a  Microbiology results: 11/7 BCx: ngtd 11/7 UCx: no growth, final  11/8 Respiratory virus panel: negative  Thank you for allowing pharmacy to be a part of this patient's care.  Cheri Rous 03/31/2016 8:47 AM

## 2016-03-31 NOTE — Progress Notes (Signed)
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 male with medical history significant graves disease recent diagnosis presents to the emergency department as directed by his endocrinologist for a low white blood cell count. Initial evaluation reveals neutropenic fever, leukopenia.Reportedly he had a chest x-ray flu swab that were negative. Reportedly his white blood cell count was 2.0 yesterday. Today he received a phone call from endocrinologist who instructed him to stop methimazole immediately and go to the emergency department  Assessment and plan #1. Neutropenic fever.  Presumably related to recent methimazole. WBCs 1.8>>3.8, ANC 13. Afebrile last 24 hours Chest x-ray  unremarkable, flu panel negative strep swab negative at PCPs office   Continue empiric antibiotics, patient has been on antibiotics since 11/7 Discontinued methimazole -Follow blood cultures no growth so far, group B strep negative, repeat chest x-ray negative -Follow urine cultures no growth so far Respiratory virus panel negative -Continue neutropenic precautions -Vitals every 43 -CBC with differential in the a.m. -Continue broad-spectrum antibiotics initiated in the emergency department Continue grainex for ANC less than thousand and hold offending agent ,  Discussed with hematology Dr Irene Limbo , he states that patient's with agranulocytosis could have fever , and we may not find an obvious source of infection, he recommends to continue empiric antibiotics until his [place] is greater than 1000    #2. Hyperthyroidism.   TSH undetectable. Free T4 1.21 Patient would need to discuss further with his outpatient  endocrinologist -Monitor  #3. Agranulocytosis -CBC  with differential , continues to show [place] to be 0 Administer granix and follow ANC , ANC needs to be greater than 1000 prior to discharge    DVT prophylaxsis Lovenox  Code Status:  Full code    Family Communication: Discussed in detail with the patient, all imaging results, lab results explained to the patient   Disposition Plan:  Anticipate discharge in 2-3 days     Consultants:  None  Procedures:  None  Antibiotics: Anti-infectives    Start     Dose/Rate Route Frequency Ordered Stop   03/28/16 2200  vancomycin (VANCOCIN) IVPB 1000 mg/200 mL premix     1,000 mg 200 mL/hr over 60 Minutes Intravenous Every 8 hours 03/28/16 1317     03/28/16 2000  piperacillin-tazobactam (ZOSYN) IVPB 3.375 g     3.375 g 12.5 mL/hr over 240 Minutes Intravenous Every 8 hours 03/28/16 1317     03/28/16 1230  piperacillin-tazobactam (ZOSYN) IVPB 3.375 g     3.375 g 100 mL/hr over 30 Minutes Intravenous  Once 03/28/16 1226 03/28/16 1422   03/28/16 1230  vancomycin (VANCOCIN) IVPB 1000 mg/200 mL premix     1,000 mg 200 mL/hr over 60 Minutes Intravenous  Once 03/28/16 1226 03/28/16 1427         HPI/Subjective:  Gum soreness has resolved, feeling a lot better  Objective: Vitals:   03/30/16 0534 03/30/16 1424 03/30/16 2102 03/31/16 0640  BP: 131/75 107/63 130/73 122/77  Pulse: 79 96 90 (!) 103  Resp: 16 18 16 17   Temp: 98.2 F (36.8 C) 98.2 F (36.8 C) 98.2 F (36.8 C) 97.3 F (36.3 C)  TempSrc: Oral Oral Oral Oral  SpO2: 95%  98% 97% 97%  Weight:      Height:        Intake/Output Summary (Last 24 hours) at 03/31/16 0819 Last data filed at 03/31/16 0400  Gross per 24 hour  Intake              720 ml  Output              800 ml  Net              -80 ml    Exam:  Examination:  General exam: Oropharyngeal erythema Respiratory system: Clear to auscultation. Respiratory effort normal. Cardiovascular system: S1 & S2  heard, RRR. No JVD, murmurs, rubs, gallops or clicks. No pedal edema. Gastrointestinal system: Abdomen is nondistended, soft and nontender. No organomegaly or masses felt. Normal bowel sounds heard. Central nervous system: Alert and oriented. No focal neurological deficits. Extremities: Symmetric 5 x 5 power. Skin: No rashes, lesions or ulcers Psychiatry: Judgement and insight appear normal. Mood & affect appropriate.     Data Reviewed: I have personally reviewed following labs and imaging studies  Micro Results Recent Results (from the past 240 hour(s))  Culture, group A strep     Status: None   Collection Time: 03/26/16  4:41 PM  Result Value Ref Range Status   Specimen Description THROAT  Final   Special Requests NONE  Final   Culture NO GROUP A STREP (S.PYOGENES) ISOLATED  Final   Report Status 03/29/2016 FINAL  Final  Blood Culture (routine x 2)     Status: None (Preliminary result)   Collection Time: 03/28/16 12:36 PM  Result Value Ref Range Status   Specimen Description BLOOD RIGHT ANTECUBITAL  Final   Special Requests BOTTLES DRAWN AEROBIC AND ANAEROBIC  5CC  Final   Culture NO GROWTH 2 DAYS  Final   Report Status PENDING  Incomplete  Blood Culture (routine x 2)     Status: None (Preliminary result)   Collection Time: 03/28/16 12:42 PM  Result Value Ref Range Status   Specimen Description BLOOD LEFT ANTECUBITAL  Final   Special Requests BOTTLES DRAWN AEROBIC AND ANAEROBIC  10CC  Final   Culture NO GROWTH 2 DAYS  Final   Report Status PENDING  Incomplete  Urine culture     Status: None   Collection Time: 03/28/16  7:51 PM  Result Value Ref Range Status   Specimen Description URINE, RANDOM  Final   Special Requests NONE  Final   Culture NO GROWTH  Final   Report Status 03/29/2016 FINAL  Final  Respiratory Panel by PCR     Status: None   Collection Time: 03/29/16 11:07 AM  Result Value Ref Range Status   Adenovirus NOT DETECTED NOT DETECTED Final   Coronavirus 229E  NOT DETECTED NOT DETECTED Final   Coronavirus HKU1 NOT DETECTED NOT DETECTED Final   Coronavirus NL63 NOT DETECTED NOT DETECTED Final   Coronavirus OC43 NOT DETECTED NOT DETECTED Final   Metapneumovirus NOT DETECTED NOT DETECTED Final   Rhinovirus / Enterovirus NOT DETECTED NOT DETECTED Final   Influenza A NOT DETECTED NOT DETECTED Final   Influenza B NOT DETECTED NOT DETECTED Final   Parainfluenza Virus 1 NOT DETECTED NOT DETECTED Final   Parainfluenza Virus 2 NOT DETECTED NOT DETECTED Final   Parainfluenza Virus 3 NOT DETECTED NOT DETECTED Final   Parainfluenza Virus 4 NOT DETECTED NOT DETECTED Final   Respiratory Syncytial Virus NOT DETECTED NOT DETECTED Final   Bordetella pertussis NOT  DETECTED NOT DETECTED Final   Chlamydophila pneumoniae NOT DETECTED NOT DETECTED Final   Mycoplasma pneumoniae NOT DETECTED NOT DETECTED Final    Radiology Reports Dg Chest 2 View  Result Date: 03/29/2016 CLINICAL DATA:  Fever and headache for 5 days. EXAM: CHEST  2 VIEW COMPARISON:  03/18/2012 FINDINGS: Again noted is a surgical plate in the left clavicle. Both lungs are clear. Heart and mediastinum are within normal limits. The trachea is midline. Mild degenerative changes in lower thoracic spine. No pleural effusions. IMPRESSION: No active cardiopulmonary disease. Electronically Signed   By: Markus Daft M.D.   On: 03/29/2016 11:04     CBC  Recent Labs Lab 03/28/16 1018 03/29/16 0653 03/30/16 0517 03/31/16 0501  WBC 1.8* 1.9* 3.5* 3.8*  HGB 13.7 12.7* 12.6* 12.5*  HCT 39.8 37.4* 37.6* 36.2*  PLT 287 311 337 356  MCV 86.0 86.0 85.8 85.2  MCH 29.6 29.2 28.8 29.4  MCHC 34.4 34.0 33.5 34.5  RDW 12.8 12.8 12.6 13.0  LYMPHSABS 1.1 1.3 2.5 1.9  MONOABS 0.5 0.5 0.7 1.0  EOSABS 0.1 0.1 0.2 0.3  BASOSABS 0.1 0.0 0.1 0.1    Chemistries   Recent Labs Lab 03/28/16 1018 03/29/16 0653 03/30/16 0517  NA 136 134* 137  K 4.1 3.6 3.3*  CL 105 102 104  CO2 24 21* 26  GLUCOSE 104* 122* 95  BUN  11 8 <5*  CREATININE 0.68 0.70 0.73  CALCIUM 9.3 9.0 8.8*  AST 18  --  16  ALT 29  --  26  ALKPHOS 88  --  87  BILITOT 1.7*  --  1.0   ------------------------------------------------------------------------------------------------------------------ estimated creatinine clearance is 105.7 mL/min (by C-G formula based on SCr of 0.73 mg/dL). ------------------------------------------------------------------------------------------------------------------ No results for input(s): HGBA1C in the last 72 hours. ------------------------------------------------------------------------------------------------------------------ No results for input(s): CHOL, HDL, LDLCALC, TRIG, CHOLHDL, LDLDIRECT in the last 72 hours. ------------------------------------------------------------------------------------------------------------------  Recent Labs  03/28/16 1018  TSH <0.010*   ------------------------------------------------------------------------------------------------------------------ No results for input(s): VITAMINB12, FOLATE, FERRITIN, TIBC, IRON, RETICCTPCT in the last 72 hours.  Coagulation profile No results for input(s): INR, PROTIME in the last 168 hours.  No results for input(s): DDIMER in the last 72 hours.  Cardiac Enzymes No results for input(s): CKMB, TROPONINI, MYOGLOBIN in the last 168 hours.  Invalid input(s): CK ------------------------------------------------------------------------------------------------------------------ Invalid input(s): POCBNP   CBG: No results for input(s): GLUCAP in the last 168 hours.     Studies: Dg Chest 2 View  Result Date: 03/29/2016 CLINICAL DATA:  Fever and headache for 5 days. EXAM: CHEST  2 VIEW COMPARISON:  03/18/2012 FINDINGS: Again noted is a surgical plate in the left clavicle. Both lungs are clear. Heart and mediastinum are within normal limits. The trachea is midline. Mild degenerative changes in lower thoracic spine. No  pleural effusions. IMPRESSION: No active cardiopulmonary disease. Electronically Signed   By: Markus Daft M.D.   On: 03/29/2016 11:04      No results found for: HGBA1C Lab Results  Component Value Date   CREATININE 0.73 03/30/2016       Scheduled Meds: . docusate sodium  100 mg Oral Daily  . enoxaparin (LOVENOX) injection  40 mg Subcutaneous Q24H  . piperacillin-tazobactam (ZOSYN)  IV  3.375 g Intravenous Q8H  . senna-docusate  1 tablet Oral BID  . Tbo-filgastrim (GRANIX) SQ  300 mcg Subcutaneous ONCE-1800  . vancomycin  1,000 mg Intravenous Q8H   Continuous Infusions:   LOS: 2 days    Time spent: >30 MINS  [facility] Pager [phone]. If 7PM-7AM, please contact night-coverage at [email or web address], password [facility] 03/31/2016, 8:19 AM  LOS: 2 days

## 2016-04-01 ENCOUNTER — Telehealth: Payer: Self-pay | Admitting: Endocrinology

## 2016-04-01 LAB — CBC WITH DIFFERENTIAL/PLATELET
Basophils Absolute: 0.1 10*3/uL (ref 0.0–0.1)
Basophils Relative: 1 %
EOS ABS: 0.4 10*3/uL (ref 0.0–0.7)
EOS PCT: 3 %
HCT: 37.7 % — ABNORMAL LOW (ref 39.0–52.0)
Hemoglobin: 12.9 g/dL — ABNORMAL LOW (ref 13.0–17.0)
LYMPHS ABS: 2.9 10*3/uL (ref 0.7–4.0)
Lymphocytes Relative: 22 %
MCH: 29.1 pg (ref 26.0–34.0)
MCHC: 34.2 g/dL (ref 30.0–36.0)
MCV: 85.1 fL (ref 78.0–100.0)
MONO ABS: 2.5 10*3/uL — AB (ref 0.1–1.0)
Monocytes Relative: 19 %
NEUTROS PCT: 55 %
Neutro Abs: 7.3 10*3/uL (ref 1.7–7.7)
PLATELETS: 413 10*3/uL — AB (ref 150–400)
RBC: 4.43 MIL/uL (ref 4.22–5.81)
RDW: 13.1 % (ref 11.5–15.5)
WBC: 13.2 10*3/uL — AB (ref 4.0–10.5)

## 2016-04-01 LAB — COMPREHENSIVE METABOLIC PANEL
ALT: 44 U/L (ref 17–63)
AST: 37 U/L (ref 15–41)
Albumin: 3 g/dL — ABNORMAL LOW (ref 3.5–5.0)
Alkaline Phosphatase: 86 U/L (ref 38–126)
Anion gap: 11 (ref 5–15)
BUN: <5 mg/dL — ABNORMAL LOW (ref 6–20)
CHLORIDE: 103 mmol/L (ref 101–111)
CO2: 24 mmol/L (ref 22–32)
Calcium: 9.1 mg/dL (ref 8.9–10.3)
Creatinine, Ser: 1.04 mg/dL (ref 0.61–1.24)
Glucose, Bld: 106 mg/dL — ABNORMAL HIGH (ref 65–99)
POTASSIUM: 3.2 mmol/L — AB (ref 3.5–5.1)
SODIUM: 138 mmol/L (ref 135–145)
Total Bilirubin: 0.6 mg/dL (ref 0.3–1.2)
Total Protein: 5.9 g/dL — ABNORMAL LOW (ref 6.5–8.1)

## 2016-04-01 MED ORDER — DOCUSATE SODIUM 100 MG PO CAPS
100.0000 mg | ORAL_CAPSULE | Freq: Every day | ORAL | Status: DC | PRN
Start: 2016-04-01 — End: 2016-04-01

## 2016-04-01 MED ORDER — SENNOSIDES-DOCUSATE SODIUM 8.6-50 MG PO TABS: 1.0000 | ORAL_TABLET | Freq: Every evening | ORAL | Status: DC | PRN

## 2016-04-01 MED ORDER — PROPRANOLOL HCL ER 60 MG PO CP24: 60.0000 mg | ORAL_CAPSULE | Freq: Every day | ORAL | 1 refills | Status: DC

## 2016-04-01 NOTE — Discharge Summary (Addendum)
Physician Discharge Summary  Lance [name] MRN: [id number] DOB/AGE: [birth year] 50 y.o.  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 Qty: 30 capsule, Refills: 1      CONTINUE these medications which have NOT CHANGED   Details  acetaminophen (TYLENOL) 650 MG CR tablet Take 1,300 mg by mouth every 8 (eight) hours as needed for pain.      STOP taking these medications     ibuprofen (ADVIL,MOTRIN) 200 MG tablet      methimazole (TAPAZOLE) 10 MG tablet           Discharge Condition:    Discharge Instructions Get Medicines reviewed and adjusted: Please take all your medications with you for your next visit with your Primary MD  Please request your Primary MD to go over all hospital tests and procedure/radiological results at the follow up, please ask your Primary MD to get all Hospital records sent to his/her office.  If you experience worsening of your admission symptoms, develop shortness of breath, life threatening emergency, suicidal or homicidal thoughts you must seek medical attention immediately by calling 911 or calling your MD immediately if symptoms less severe.  You must read complete instructions/literature along with all the possible adverse reactions/side effects for all the Medicines you take and that have been prescribed to you. Take any new Medicines after you have completely  understood and accpet all the possible adverse reactions/side effects.   Do not drive when taking Pain medications.   Do not take more than prescribed Pain, Sleep and Anxiety Medications  Special Instructions: If you have smoked or chewed Tobacco in the last 2 yrs please stop smoking, stop any regular Alcohol and or any Recreational drug use.  Wear Seat belts while driving.  Please note  You were cared for by a hospitalist during your hospital stay. Once you are discharged, your primary care physician will handle any further medical issues. Please note that NO REFILLS for any discharge medications will be authorized once you are discharged, as it is imperative that you return to your primary care physician (or establish a relationship with a primary care physician if you do not have one) for your aftercare needs so that they can reassess your need for medications and monitor your lab values.            Significant Diagnostic Studies:  Dg Chest 2 View  Result Date: 03/29/2016 CLINICAL DATA:  Fever and headache for 5 days. EXAM: CHEST  2 VIEW COMPARISON:  03/18/2012 FINDINGS: Again noted is a surgical plate in the left clavicle. Both lungs are clear. Heart and mediastinum are within normal limits. The trachea is midline. Mild degenerative changes in lower thoracic spine. No pleural effusions. IMPRESSION: No active cardiopulmonary disease. Electronically Signed   By: Markus Daft M.D.   On: 03/29/2016 11:04      Filed Weights   03/28/16 2924 03/28/16 1448  Weight: 72.6 kg (160 lb) 73.7 kg (162 lb 7.7 oz)     Microbiology: Recent Results (from the past 240 hour(s))  Culture, group A strep     Status: None   Collection Time: 03/26/16  4:41 PM  Result Value Ref Range Status   Specimen Description THROAT  Final   Special Requests NONE  Final   Culture NO GROUP A STREP (S.PYOGENES) ISOLATED  Final   Report Status 03/29/2016 FINAL  Final  Blood Culture (routine x 2)     Status:  None (Preliminary result)   Collection Time: 03/28/16 12:36 PM  Result Value Ref Range Status   Specimen Description BLOOD RIGHT ANTECUBITAL  Final   Special Requests BOTTLES DRAWN AEROBIC AND ANAEROBIC  5CC  Final   Culture NO GROWTH 4 DAYS  Final   Report Status PENDING  Incomplete  Blood Culture (routine x 2)     Status: None (Preliminary result)   Collection Time: 03/28/16 12:42 PM  Result Value Ref Range Status   Specimen Description BLOOD LEFT ANTECUBITAL  Final   Special Requests BOTTLES DRAWN AEROBIC AND ANAEROBIC  10CC  Final   Culture NO GROWTH 4 DAYS  Final   Report Status PENDING  Incomplete  Urine culture     Status: None   Collection Time: 03/28/16  7:51 PM  Result Value Ref Range Status   Specimen Description URINE, RANDOM  Final   Special Requests NONE  Final   Culture NO GROWTH  Final   Report Status 03/29/2016 FINAL  Final  Respiratory Panel by PCR     Status: None   Collection Time: 03/29/16 11:07 AM  Result Value Ref Range Status   Adenovirus NOT DETECTED NOT DETECTED Final   Coronavirus 229E NOT DETECTED NOT DETECTED Final   Coronavirus HKU1 NOT DETECTED NOT DETECTED Final   Coronavirus NL63 NOT DETECTED NOT DETECTED Final   Coronavirus OC43 NOT DETECTED NOT DETECTED Final   Metapneumovirus NOT DETECTED NOT DETECTED Final   Rhinovirus / Enterovirus NOT DETECTED NOT DETECTED Final   Influenza A NOT DETECTED NOT DETECTED Final   Influenza B NOT DETECTED NOT DETECTED Final   Parainfluenza Virus 1 NOT DETECTED NOT DETECTED Final   Parainfluenza Virus 2 NOT DETECTED NOT DETECTED Final   Parainfluenza Virus 3 NOT DETECTED NOT DETECTED Final   Parainfluenza Virus 4 NOT DETECTED NOT DETECTED Final   Respiratory Syncytial Virus NOT DETECTED NOT DETECTED Final   Bordetella pertussis NOT DETECTED NOT DETECTED Final   Chlamydophila pneumoniae NOT DETECTED NOT DETECTED Final   Mycoplasma pneumoniae NOT DETECTED NOT DETECTED Final       Blood Culture    Component  Value Date/Time   SDES URINE, RANDOM 03/28/2016 1951   SPECREQUEST NONE 03/28/2016 1951   CULT NO GROWTH 03/28/2016 1951   REPTSTATUS 03/29/2016 FINAL 03/28/2016 1951      Labs: Results for orders placed or performed during the hospital encounter of 03/28/16 (from the past 48 hour(s))  Procalcitonin     Status: None   Collection Time: 03/31/16  5:01 AM  Result Value Ref Range   Procalcitonin 0.10 ng/mL    Comment:        Interpretation: PCT (Procalcitonin) <= 0.5 ng/mL: Systemic infection (sepsis) is not likely. Local bacterial infection is possible. (NOTE)         ICU PCT Algorithm               Non ICU PCT Algorithm    ----------------------------     ------------------------------  PCT < 0.25 ng/mL                 PCT < 0.1 ng/mL     Stopping of antibiotics            Stopping of antibiotics       strongly encouraged.               strongly encouraged.    ----------------------------     ------------------------------       PCT level decrease by               PCT < 0.25 ng/mL       >= 80% from peak PCT       OR PCT 0.25 - 0.5 ng/mL          Stopping of antibiotics                                             encouraged.     Stopping of antibiotics           encouraged.    ----------------------------     ------------------------------       PCT level decrease by              PCT >= 0.25 ng/mL       < 80% from peak PCT        AND PCT >= 0.5 ng/mL            Continuin g antibiotics                                              encouraged.       Continuing antibiotics            encouraged.    ----------------------------     ------------------------------     PCT level increase compared          PCT > 0.5 ng/mL         with peak PCT AND          PCT >= 0.5 ng/mL             Escalation of antibiotics                                          strongly encouraged.      Escalation of antibiotics        strongly encouraged.   CBC with Differential/Platelet     Status:  Abnormal   Collection Time: 03/31/16  5:01 AM  Result Value Ref Range   WBC 3.8 (L) 4.0 - 10.5 K/uL   RBC 4.25 4.22 - 5.81 MIL/uL   Hemoglobin 12.5 (L) 13.0 - 17.0 g/dL   HCT 36.2 (L) 39.0 - 52.0 %   MCV 85.2 78.0 - 100.0 fL   MCH 29.4 26.0 - 34.0 pg   MCHC 34.5 30.0 - 36.0 g/dL   RDW 13.0 11.5 - 15.5 %   Platelets 356 150 - 400 K/uL   Neutrophils Relative % 13 %   Lymphocytes Relative 52 %   Monocytes Relative 26 %   Eosinophils Relative 7 %  Basophils Relative 2 %   Neutro Abs 0.5 (L) 1.7 - 7.7 K/uL   Lymphs Abs 1.9 0.7 - 4.0 K/uL   Monocytes Absolute 1.0 0.1 - 1.0 K/uL   Eosinophils Absolute 0.3 0.0 - 0.7 K/uL   Basophils Absolute 0.1 0.0 - 0.1 K/uL   WBC Morphology ATYPICAL LYMPHOCYTES     Comment: MILD LEFT SHIFT (1-5% METAS, OCC MYELO, OCC BANDS)  Vancomycin, trough     Status: None   Collection Time: 03/31/16  5:01 AM  Result Value Ref Range   Vancomycin Tr 16 15 - 20 ug/mL  CBC with Differential/Platelet     Status: Abnormal   Collection Time: 04/01/16  5:15 AM  Result Value Ref Range   WBC 13.2 (H) 4.0 - 10.5 K/uL   RBC 4.43 4.22 - 5.81 MIL/uL   Hemoglobin 12.9 (L) 13.0 - 17.0 g/dL   HCT 37.7 (L) 39.0 - 52.0 %   MCV 85.1 78.0 - 100.0 fL   MCH 29.1 26.0 - 34.0 pg   MCHC 34.2 30.0 - 36.0 g/dL   RDW 13.1 11.5 - 15.5 %   Platelets 413 (H) 150 - 400 K/uL   Neutrophils Relative % 55 %   Lymphocytes Relative 22 %   Monocytes Relative 19 %   Eosinophils Relative 3 %   Basophils Relative 1 %   Neutro Abs 7.3 1.7 - 7.7 K/uL   Lymphs Abs 2.9 0.7 - 4.0 K/uL   Monocytes Absolute 2.5 (H) 0.1 - 1.0 K/uL   Eosinophils Absolute 0.4 0.0 - 0.7 K/uL   Basophils Absolute 0.1 0.0 - 0.1 K/uL   WBC Morphology MILD LEFT SHIFT (1-5% METAS, OCC MYELO, OCC BANDS)     Comment: ATYPICAL LYMPHOCYTES TOXIC GRANULATION   Comprehensive metabolic panel     Status: Abnormal   Collection Time: 04/01/16  5:15 AM  Result Value Ref Range   Sodium 138 135 - 145 mmol/L   Potassium 3.2 (L)  3.5 - 5.1 mmol/L   Chloride 103 101 - 111 mmol/L   CO2 24 22 - 32 mmol/L   Glucose, Bld 106 (H) 65 - 99 mg/dL   BUN <5 (L) 6 - 20 mg/dL   Creatinine, Ser 1.04 0.61 - 1.24 mg/dL   Calcium 9.1 8.9 - 10.3 mg/dL   Total Protein 5.9 (L) 6.5 - 8.1 g/dL   Albumin 3.0 (L) 3.5 - 5.0 g/dL   AST 37 15 - 41 U/L   ALT 44 17 - 63 U/L   Alkaline Phosphatase 86 38 - 126 U/L   Total Bilirubin 0.6 0.3 - 1.2 mg/dL   GFR calc non Af Amer >60 >60 mL/min   GFR calc Af Amer >60 >60 mL/min    Comment: (NOTE) The eGFR has been calculated using the CKD EPI equation. This calculation has not been validated in all clinical situations. eGFR's persistently <60 mL/min signify possible Chronic Kidney Disease.    Anion gap 11 5 - 15     Lipid Panel  No results found for: CHOL, TRIG, HDL, CHOLHDL, VLDL, LDLCALC, LDLDIRECT   No results found for: HGBA1C   Lab Results  Component Value Date   CREATININE 1.04 04/01/2016     HPI :  Lance E Collieis a very pleasant 50 y.o.malewith medical history significant graves disease recent diagnosis presents to the emergency department as directed by his endocrinologist for a low white blood cell count. Initial evaluation reveals neutropenic fever, leukopenia.Reportedly he had a chest x-ray flu swab that were  negative. Reportedly his white blood cell count was 2.0 yesterday. Today he received a phone call from endocrinologist who instructed him to stop methimazole immediately and go to the emergency [place]: *  #1. Neutropenic fever.  Presumably related to recent methimazole. WBCs improved after receiving granix  1.8>>3.8>13.2, with 55% neutrophils on the day of discharge  patient has been afebrile for 48 hours Chest x-ray  unremarkable, flu panel negative strep swab negative at PCPs office   Initially placed on empiric antibiotics, patient has been on antibiotics since 11/7-11/11 Discontinued methimazole -Follow blood cultures no growth so far,  group B strep negative, repeat chest x-ray negative -Follow urine cultures no growth so far Respiratory virus panel negative -Continue neutropenic precautions -Vitals every 43 -CBC with differential in the a.m. -Continue broad-spectrum antibiotics initiated in the emergency department Continue grainex for ANC less than thousand and hold offending agent ,  Discussed with hematology Dr Irene Limbo , he states that patient's with agranulocytosis could have fever , and we may not find an obvious source of infection, he recommends to continue empiric antibiotics until his [place] is greater than 1000 Patient would need close follow-up of his neutropenia by his PCP    #2. Hyperthyroidism.   TSH undetectable. Free T4 1.21 Patient would need to discuss further with his outpatient endocrinologist Started on beta blocker for sinus tachycardia    #3. Agranulocytosis -CBC  with differential ,   ANC  Was 0 for 2 days  Now resolved       Discharge Exam:   Blood pressure 130/78, pulse (!) 112, temperature 98.4 F (36.9 C), temperature source Oral, resp. rate 18, height _0  (1.727 m), weight 73.7 kg (162 lb 7.7 oz), SpO2 98 %.   General exam: Oropharyngeal erythema Respiratory system: Clear to auscultation. Respiratory effort normal. Cardiovascular system: S1 & S2 heard, RRR. No JVD, murmurs, rubs, gallops or clicks. No pedal edema. Gastrointestinal system: Abdomen is nondistended, soft and nontender. No organomegaly or masses felt. Normal bowel sounds heard. Central nervous system: Alert and oriented. No focal neurological deficits. Extremities: Symmetric 5 x 5 power. Skin: No rashes, lesions or ulcers Psychiatry: Judgement and insight appear normal. Mood & affect appropriate   Follow-up Information    Velna Hatchet, MD. Call.   Specialty:  Internal Medicine Why:  For hospital follow-up, see PCP in 3-5 days Contact information: [place] [address] [phone]            Signed: Reyne Dumas 04/01/2016, 8:29 AM        Time spent >45 mins

## 2016-04-01 NOTE — Progress Notes (Signed)
Patient discharge instruction given and prescription at [facility] for pickup. Patient verbalized understanding using teach back.

## 2016-04-01 NOTE — Telephone Encounter (Signed)
please call patient: Ov this week

## 2016-04-01 NOTE — Care Management Note (Signed)
Case Management Note  Patient Details  Name: ZUBAIR BALZ MRN: [id number] Date of Birth: Aug 13, 1965  Subjective/Objective:        Graves Disease            Action/Plan: Discharge Planning: AVS reviewed: Chart reviewed. No NCM needs identified.    Expected Discharge Date:  04/01/2016              Expected Discharge Plan:  Home/Self Care (Lives with wife, Wells Guiles)  In-House Referral:  NA  Discharge planning Services  CM Consult  Post Acute Care Choice:  NA Choice offered to:  NA  DME Arranged:  N/A DME Agency:  NA  HH Arranged:  NA HH Agency:  NA  Status of Service:  Completed, signed off  If discussed at [place] of Stay Meetings, dates discussed:    Additional Comments:  Erenest Rasher, RN 04/01/2016, 3:42 PM

## 2016-04-02 LAB — CULTURE, BLOOD (ROUTINE X 2)
CULTURE: NO GROWTH
Culture: NO GROWTH

## 2016-04-03 NOTE — Telephone Encounter (Signed)
Caitlin, Could you contact the patient to schedule his appointment? Thanks!

## 2016-04-05 DIAGNOSIS — D709 Neutropenia, unspecified: Secondary | ICD-10-CM | POA: Diagnosis not present

## 2016-04-05 DIAGNOSIS — Z6823 Body mass index (BMI) 23.0-23.9, adult: Secondary | ICD-10-CM | POA: Diagnosis not present

## 2016-04-05 DIAGNOSIS — E0500 Thyrotoxicosis with diffuse goiter without thyrotoxic crisis or storm: Secondary | ICD-10-CM | POA: Diagnosis not present

## 2016-04-05 DIAGNOSIS — D72819 Decreased white blood cell count, unspecified: Secondary | ICD-10-CM | POA: Diagnosis not present

## 2016-04-11 DIAGNOSIS — E0500 Thyrotoxicosis with diffuse goiter without thyrotoxic crisis or storm: Secondary | ICD-10-CM | POA: Diagnosis not present

## 2016-04-11 DIAGNOSIS — H5203 Hypermetropia, bilateral: Secondary | ICD-10-CM | POA: Diagnosis not present

## 2016-04-11 DIAGNOSIS — D3131 Benign neoplasm of right choroid: Secondary | ICD-10-CM | POA: Diagnosis not present

## 2016-04-12 ENCOUNTER — Other Ambulatory Visit (HOSPITAL_COMMUNITY): Payer: Self-pay | Admitting: Endocrinology

## 2016-04-12 DIAGNOSIS — R000 Tachycardia, unspecified: Secondary | ICD-10-CM | POA: Diagnosis not present

## 2016-04-12 DIAGNOSIS — E0500 Thyrotoxicosis with diffuse goiter without thyrotoxic crisis or storm: Secondary | ICD-10-CM | POA: Diagnosis not present

## 2016-04-19 ENCOUNTER — Encounter (HOSPITAL_COMMUNITY)
Admission: RE | Admit: 2016-04-19 | Discharge: 2016-04-19 | Disposition: A | Discharge status: Home / Self Care | Payer: BLUE CROSS/BLUE SHIELD | Source: Ambulatory Visit | Attending: Endocrinology | Admitting: Endocrinology

## 2016-04-19 DIAGNOSIS — E0500 Thyrotoxicosis with diffuse goiter without thyrotoxic crisis or storm: Secondary | ICD-10-CM | POA: Diagnosis not present

## 2016-04-19 DIAGNOSIS — E0590 Thyrotoxicosis, unspecified without thyrotoxic crisis or storm: Secondary | ICD-10-CM | POA: Diagnosis not present

## 2016-04-19 MED ORDER — SODIUM IODIDE I 131 CAPSULE: 17.7000 | Freq: Once | INTRAVENOUS | Status: DC | PRN

## 2016-04-21 DIAGNOSIS — E0500 Thyrotoxicosis with diffuse goiter without thyrotoxic crisis or storm: Secondary | ICD-10-CM | POA: Diagnosis not present

## 2016-05-01 DIAGNOSIS — R000 Tachycardia, unspecified: Secondary | ICD-10-CM | POA: Diagnosis not present

## 2016-06-15 DIAGNOSIS — E0500 Thyrotoxicosis with diffuse goiter without thyrotoxic crisis or storm: Secondary | ICD-10-CM | POA: Diagnosis not present

## 2016-07-27 DIAGNOSIS — E0500 Thyrotoxicosis with diffuse goiter without thyrotoxic crisis or storm: Secondary | ICD-10-CM | POA: Diagnosis not present

## 2016-08-01 DIAGNOSIS — E890 Postprocedural hypothyroidism: Secondary | ICD-10-CM | POA: Diagnosis not present

## 2016-08-28 DIAGNOSIS — E0500 Thyrotoxicosis with diffuse goiter without thyrotoxic crisis or storm: Secondary | ICD-10-CM | POA: Diagnosis not present

## 2016-09-26 DIAGNOSIS — E890 Postprocedural hypothyroidism: Secondary | ICD-10-CM | POA: Diagnosis not present

## 2016-10-19 DIAGNOSIS — J019 Acute sinusitis, unspecified: Secondary | ICD-10-CM | POA: Diagnosis not present

## 2016-10-19 DIAGNOSIS — H9202 Otalgia, left ear: Secondary | ICD-10-CM | POA: Diagnosis not present

## 2016-10-30 DIAGNOSIS — E890 Postprocedural hypothyroidism: Secondary | ICD-10-CM | POA: Diagnosis not present

## 2016-11-04 ENCOUNTER — Ambulatory Visit (HOSPITAL_COMMUNITY)
Admission: EM | Admit: 2016-11-04 | Discharge: 2016-11-04 | Disposition: A | Discharge status: Home / Self Care | Payer: Worker's Compensation | Attending: Emergency Medicine | Admitting: Emergency Medicine

## 2016-11-04 ENCOUNTER — Encounter (HOSPITAL_COMMUNITY)
Admission: EM | Disposition: A | Discharge status: Home / Self Care | Payer: Self-pay | Source: Home / Self Care | Attending: Emergency Medicine

## 2016-11-04 ENCOUNTER — Emergency Department (HOSPITAL_COMMUNITY): Payer: Worker's Compensation | Admitting: Anesthesiology

## 2016-11-04 ENCOUNTER — Encounter (HOSPITAL_COMMUNITY): Payer: Self-pay

## 2016-11-04 ENCOUNTER — Emergency Department (HOSPITAL_COMMUNITY): Payer: Worker's Compensation

## 2016-11-04 DIAGNOSIS — S66320A Laceration of extensor muscle, fascia and tendon of right index finger at wrist and hand level, initial encounter: Secondary | ICD-10-CM | POA: Insufficient documentation

## 2016-11-04 DIAGNOSIS — S61210A Laceration without foreign body of right index finger without damage to nail, initial encounter: Secondary | ICD-10-CM | POA: Diagnosis present

## 2016-11-04 DIAGNOSIS — S62630B Displaced fracture of distal phalanx of right index finger, initial encounter for open fracture: Secondary | ICD-10-CM | POA: Diagnosis not present

## 2016-11-04 DIAGNOSIS — Z87891 Personal history of nicotine dependence: Secondary | ICD-10-CM | POA: Diagnosis not present

## 2016-11-04 DIAGNOSIS — Z79899 Other long term (current) drug therapy: Secondary | ICD-10-CM | POA: Insufficient documentation

## 2016-11-04 DIAGNOSIS — Y990 Civilian activity done for income or pay: Secondary | ICD-10-CM | POA: Diagnosis not present

## 2016-11-04 DIAGNOSIS — W3189XA Contact with other specified machinery, initial encounter: Secondary | ICD-10-CM | POA: Insufficient documentation

## 2016-11-04 DIAGNOSIS — S62620B Displaced fracture of medial phalanx of right index finger, initial encounter for open fracture: Secondary | ICD-10-CM | POA: Insufficient documentation

## 2016-11-04 HISTORY — PX: I & D EXTREMITY: SHX5045

## 2016-11-04 SURGERY — IRRIGATION AND DEBRIDEMENT EXTREMITY
Anesthesia: General | Site: Arm Lower | Laterality: Right

## 2016-11-04 MED ORDER — PROPOFOL 10 MG/ML IV BOLUS
INTRAVENOUS | Status: AC
  Filled 2016-11-04: qty 20

## 2016-11-04 MED ORDER — DOCUSATE SODIUM 100 MG PO CAPS: 100.0000 mg | ORAL_CAPSULE | Freq: Two times a day (BID) | ORAL | 0 refills | Status: DC

## 2016-11-04 MED ORDER — CEPHALEXIN 500 MG PO CAPS: 500.0000 mg | ORAL_CAPSULE | Freq: Four times a day (QID) | ORAL | 0 refills | Status: AC

## 2016-11-04 MED ORDER — FENTANYL CITRATE (PF) 100 MCG/2ML IJ SOLN
INTRAMUSCULAR | Status: DC | PRN
  Administered 2016-11-04: 50 ug via INTRAVENOUS

## 2016-11-04 MED ORDER — LACTATED RINGERS IV SOLN
INTRAVENOUS | Status: DC
  Administered 2016-11-04: 16:00:00 via INTRAVENOUS

## 2016-11-04 MED ORDER — SODIUM CHLORIDE 0.9 % IR SOLN
Status: DC | PRN
  Administered 2016-11-04: 1000 mL

## 2016-11-04 MED ORDER — FENTANYL CITRATE (PF) 100 MCG/2ML IJ SOLN: 25.0000 ug | INTRAMUSCULAR | Status: DC | PRN

## 2016-11-04 MED ORDER — OXYCODONE HCL 5 MG/5ML PO SOLN: 5.0000 mg | Freq: Once | ORAL | Status: DC | PRN

## 2016-11-04 MED ORDER — BUPIVACAINE HCL (PF) 0.25 % IJ SOLN
INTRAMUSCULAR | Status: DC | PRN
  Administered 2016-11-04: 10 mL

## 2016-11-04 MED ORDER — CEFAZOLIN SODIUM-DEXTROSE 2-4 GM/100ML-% IV SOLN
2.0000 g | INTRAVENOUS | Status: AC
  Administered 2016-11-04: 2 g via INTRAVENOUS

## 2016-11-04 MED ORDER — MIDAZOLAM HCL 2 MG/2ML IJ SOLN
INTRAMUSCULAR | Status: AC
  Filled 2016-11-04: qty 2

## 2016-11-04 MED ORDER — OXYCODONE-ACETAMINOPHEN 5-325 MG PO TABS
2.0000 | ORAL_TABLET | Freq: Once | ORAL | Status: AC
  Administered 2016-11-04: 2 via ORAL
  Filled 2016-11-04: qty 2

## 2016-11-04 MED ORDER — OXYCODONE HCL 5 MG PO TABS: 5.0000 mg | ORAL_TABLET | Freq: Once | ORAL | Status: DC | PRN

## 2016-11-04 MED ORDER — ONDANSETRON HCL 4 MG/2ML IJ SOLN: 4.0000 mg | Freq: Once | INTRAMUSCULAR | Status: DC | PRN

## 2016-11-04 MED ORDER — CHLORHEXIDINE GLUCONATE 4 % EX LIQD: 60.0000 mL | Freq: Once | CUTANEOUS | Status: DC

## 2016-11-04 MED ORDER — MIDAZOLAM HCL 5 MG/5ML IJ SOLN
INTRAMUSCULAR | Status: DC | PRN
  Administered 2016-11-04: 2 mg via INTRAVENOUS

## 2016-11-04 MED ORDER — FENTANYL CITRATE (PF) 250 MCG/5ML IJ SOLN
INTRAMUSCULAR | Status: AC
  Filled 2016-11-04: qty 5

## 2016-11-04 MED ORDER — OXYCODONE-ACETAMINOPHEN 5-325 MG PO TABS: 1.0000 | ORAL_TABLET | Freq: Three times a day (TID) | ORAL | 0 refills | Status: AC

## 2016-11-04 MED ORDER — LIDOCAINE HCL 2 % IJ SOLN
10.0000 mL | Freq: Once | INTRAMUSCULAR | Status: AC
  Administered 2016-11-04: 200 mg
  Filled 2016-11-04: qty 20

## 2016-11-04 MED ORDER — CEFAZOLIN SODIUM-DEXTROSE 2-4 GM/100ML-% IV SOLN
INTRAVENOUS | Status: AC
  Filled 2016-11-04: qty 100

## 2016-11-04 MED ORDER — BUPIVACAINE HCL (PF) 0.25 % IJ SOLN
INTRAMUSCULAR | Status: AC
  Filled 2016-11-04: qty 30

## 2016-11-04 MED ORDER — PROPOFOL 10 MG/ML IV BOLUS
INTRAVENOUS | Status: DC | PRN
  Administered 2016-11-04: 180 mg via INTRAVENOUS

## 2016-11-04 MED ORDER — LACTATED RINGERS IV SOLN
INTRAVENOUS | Status: DC | PRN
  Administered 2016-11-04: 16:00:00 via INTRAVENOUS

## 2016-11-04 SURGICAL SUPPLY — 60 items
BANDAGE ACE 3X5.8 VEL STRL LF (GAUZE/BANDAGES/DRESSINGS) ×2 IMPLANT
BANDAGE ACE 4X5 VEL STRL LF (GAUZE/BANDAGES/DRESSINGS) ×2 IMPLANT
BANDAGE GAUZE 4  KLING STR (GAUZE/BANDAGES/DRESSINGS) ×2 IMPLANT
BNDG COHESIVE 1X5 TAN STRL LF (GAUZE/BANDAGES/DRESSINGS) ×2 IMPLANT
BNDG CONFORM 2 STRL LF (GAUZE/BANDAGES/DRESSINGS) IMPLANT
BNDG ESMARK 4X9 LF (GAUZE/BANDAGES/DRESSINGS) ×2 IMPLANT
BNDG GAUZE ELAST 4 BULKY (GAUZE/BANDAGES/DRESSINGS) ×2 IMPLANT
CORDS BIPOLAR (ELECTRODE) ×2 IMPLANT
COVER SURGICAL LIGHT HANDLE (MISCELLANEOUS) ×2 IMPLANT
CUFF TOURNIQUET SINGLE 18IN (TOURNIQUET CUFF) ×2 IMPLANT
CUFF TOURNIQUET SINGLE 24IN (TOURNIQUET CUFF) IMPLANT
DRAIN PENROSE 1/4X12 LTX STRL (WOUND CARE) IMPLANT
DRAPE SURG 17X23 STRL (DRAPES) ×2 IMPLANT
DRSG ADAPTIC 3X8 NADH LF (GAUZE/BANDAGES/DRESSINGS) ×2 IMPLANT
DRSG EMULSION OIL 3X3 NADH (GAUZE/BANDAGES/DRESSINGS) ×2 IMPLANT
ELECT REM PT RETURN 9FT ADLT (ELECTROSURGICAL)
ELECTRODE REM PT RTRN 9FT ADLT (ELECTROSURGICAL) IMPLANT
GAUZE SPONGE 4X4 12PLY STRL (GAUZE/BANDAGES/DRESSINGS) ×2 IMPLANT
GAUZE SPONGE 4X4 12PLY STRL LF (GAUZE/BANDAGES/DRESSINGS) ×2 IMPLANT
GAUZE XEROFORM 1X8 LF (GAUZE/BANDAGES/DRESSINGS) ×2 IMPLANT
GAUZE XEROFORM 5X9 LF (GAUZE/BANDAGES/DRESSINGS) IMPLANT
GLOVE BIOGEL PI IND STRL 8.5 (GLOVE) ×1 IMPLANT
GLOVE BIOGEL PI INDICATOR 8.5 (GLOVE) ×1
GLOVE SURG ORTHO 8.0 STRL STRW (GLOVE) ×2 IMPLANT
GOWN STRL REUS W/ TWL LRG LVL3 (GOWN DISPOSABLE) ×3 IMPLANT
GOWN STRL REUS W/ TWL XL LVL3 (GOWN DISPOSABLE) ×1 IMPLANT
GOWN STRL REUS W/TWL LRG LVL3 (GOWN DISPOSABLE) ×3
GOWN STRL REUS W/TWL XL LVL3 (GOWN DISPOSABLE) ×1
HANDPIECE INTERPULSE COAX TIP (DISPOSABLE)
KIT BASIN OR (CUSTOM PROCEDURE TRAY) ×2 IMPLANT
KIT ROOM TURNOVER OR (KITS) ×2 IMPLANT
MANIFOLD NEPTUNE II (INSTRUMENTS) ×2 IMPLANT
NEEDLE HYPO 25GX1X1/2 BEV (NEEDLE) IMPLANT
NS IRRIG 1000ML POUR BTL (IV SOLUTION) ×2 IMPLANT
PACK ORTHO EXTREMITY (CUSTOM PROCEDURE TRAY) ×2 IMPLANT
PAD ARMBOARD 7.5X6 YLW CONV (MISCELLANEOUS) ×4 IMPLANT
PAD CAST 3X4 CTTN HI CHSV (CAST SUPPLIES) ×1 IMPLANT
PAD CAST 4YDX4 CTTN HI CHSV (CAST SUPPLIES) ×1 IMPLANT
PADDING CAST COTTON 3X4 STRL (CAST SUPPLIES) ×1
PADDING CAST COTTON 4X4 STRL (CAST SUPPLIES) ×1
SET HNDPC FAN SPRY TIP SCT (DISPOSABLE) IMPLANT
SOAP 2 % CHG 4 OZ (WOUND CARE) ×2 IMPLANT
SPONGE LAP 18X18 X RAY DECT (DISPOSABLE) ×2 IMPLANT
SPONGE LAP 4X18 X RAY DECT (DISPOSABLE) ×2 IMPLANT
SUCTION FRAZIER HANDLE 10FR (MISCELLANEOUS) ×1
SUCTION TUBE FRAZIER 10FR DISP (MISCELLANEOUS) ×1 IMPLANT
SUT ETHILON 4 0 PS 2 18 (SUTURE) IMPLANT
SUT ETHILON 5 0 P 3 18 (SUTURE)
SUT NYLON ETHILON 5-0 P-3 1X18 (SUTURE) IMPLANT
SUT PROLENE 3 0 PS 2 (SUTURE) ×2 IMPLANT
SWAB CULTURE ESWAB REG 1ML (MISCELLANEOUS) IMPLANT
SYR CONTROL 10ML LL (SYRINGE) IMPLANT
TOWEL OR 17X24 6PK STRL BLUE (TOWEL DISPOSABLE) ×2 IMPLANT
TOWEL OR 17X26 10 PK STRL BLUE (TOWEL DISPOSABLE) ×2 IMPLANT
TUBE CONNECTING 12X1/4 (SUCTIONS) ×2 IMPLANT
UNDERPAD 30X30 (UNDERPADS AND DIAPERS) ×2 IMPLANT
WATER STERILE IRR 1000ML POUR (IV SOLUTION) ×2 IMPLANT
WIRE K .035MM 164206035 (MISCELLANEOUS) ×2 IMPLANT
WIRE K 1.1 (MISCELLANEOUS) ×4 IMPLANT
YANKAUER SUCT BULB TIP NO VENT (SUCTIONS) ×2 IMPLANT

## 2016-11-04 NOTE — Progress Notes (Signed)
Dr Caralyn Guile called to speak with pt's wife about surgery- follow call to [place] at [facility] to transfer patients scripts from [facility] on [address] due to workman's comp per pt & wife request. Pt has script for pain meds.

## 2016-11-04 NOTE — Transfer of Care (Signed)
Immediate Anesthesia Transfer of Care Note  Patient: Lance [name]  Procedure(s) Performed: Procedure(s): IRRIGATION AND DEBRIDEMENT INDEX FINGER AND PINNING (Right)  Patient Location: PACU  Anesthesia Type:General  Level of Consciousness: awake  Airway & Oxygen Therapy: Patient Spontanous Breathing  Post-op Assessment: Report given to RN  Post vital signs: Reviewed and stable  Last Vitals:  Vitals:   11/04/16 1216 11/04/16 1543  BP: (!) 138/95 128/84  Pulse: 79 72  Resp: 18 20  Temp: 36.9 C 36.7 C    Last Pain:  Vitals:   11/04/16 1548  TempSrc:   PainSc: 0-No pain         Complications: No apparent anesthesia complications

## 2016-11-04 NOTE — Anesthesia Procedure Notes (Signed)
Procedure Name: LMA Insertion Date/Time: 11/04/2016 4:43 PM Performed by: Eligha Bridegroom Pre-anesthesia Checklist: Patient identified, Emergency Drugs available, Suction available, Patient being monitored and Timeout performed Patient Re-evaluated:Patient Re-evaluated prior to inductionOxygen Delivery Method: Circle system utilized Preoxygenation: Pre-oxygenation with 100% oxygen Intubation Type: IV induction LMA: LMA inserted LMA Size: 4.0 Placement Confirmation: positive ETCO2 and breath sounds checked- equal and bilateral Tube secured with: Tape

## 2016-11-04 NOTE — ED Triage Notes (Signed)
PT reports to be up to date with his tetanus vac.

## 2016-11-04 NOTE — H&P (Signed)
Field Lance [name] is an 51 y.o. male.   Chief Complaint: Right index finger laceration and injury HPI: Lance [name] is a 51 y.o. male who presents to the Emergency Department complaining of R index finger laceration with onset x1 hour. Pt states that while at work moving a large fan, his R index finger was caught in the fan causing a laceration to the dorsal aspect. Pt denies any modifying factors. He denies any other complaints. Of note, pt's tetanus is UTD.  Past Medical History:  Diagnosis Date  . Graves disease    dx'd 01/2016  . History of kidney stones   . Hyperthyroidism   . Leukopenia   . Neutropenic fever ([place])   . Pneumonia ~ 2015 X 1    Past Surgical History:  Procedure Laterality Date  . CLAVICLE SURGERY Left 1980s   has plates and pins  . FRACTURE SURGERY    . LITHOTRIPSY  1990s    Family History  Problem Relation Age of Onset  . Bladder Cancer Father   . Colon cancer Neg Hx   . Esophageal cancer Neg Hx   . Prostate cancer Neg Hx   . Rectal cancer Neg Hx   . Stomach cancer Neg Hx   . Thyroid disease Neg Hx    Social History:  reports that he quit smoking about 2 years ago. His smoking use included Cigarettes. He has a 10.00 pack-year smoking history. He has quit using smokeless tobacco. His smokeless tobacco use included Chew. He reports that he does not drink alcohol or use drugs.  Allergies:  Allergies  Allergen Reactions  . Methimazole     WBC drops     (Not in a hospital admission)  No results found for this or any previous visit (from the past 48 hour(s)). Dg Finger Index Right  Result Date: 11/04/2016 CLINICAL DATA:  Right index finger injury and laceration across the PIP joint. Pt was moving a large fan today when the front cover the fan came loose and his hand entered the inside of the case while the fan was spinning. EXAM: RIGHT INDEX FINGER 2+V COMPARISON:  None. FINDINGS: There is a comminuted fracture involving the proximal aspect and articular  surface of the middle phalanx of the index finger. There is a transverse fracture of the distal aspect of the distal phalanx. There is associated soft tissue swelling. IMPRESSION: Fractures of the distal phalanx and middle phalanx of the index finger. Electronically Signed   By: Nolon Nations M.D.   On: 11/04/2016 14:00    ROSNo recent illnesses or hospitalizations  Blood pressure (!) 138/95, pulse 79, temperature 98.5 F (36.9 C), resp. rate 18, SpO2 97 %. Physical Exam  General Appearance:  Alert, cooperative, no distress, appears stated age  Head:  Normocephalic, without obvious abnormality, atraumatic  Eyes:  Pupils equal, conjunctiva/corneas clear,         Throat: Lips, mucosa, and tongue normal; teeth and gums normal  Neck: No visible masses     Lungs:   respirations unlabored  Chest Wall:  No tenderness or deformity  Heart:  Regular rate and rhythm,  Abdomen:   Soft, non-tender,         Extremities: Patient does have the oblique laceration directly over the middle phalanx. Patient also has some mild malrotation and some ecchymosis to the distal phalanx. No volar wounds. He has no wounds to the long ring and small good mobility to the long ring and small and thumb  Pulses: 2+ and symmetric  Skin: Skin color, texture, turgor normal, no rashes or lesions     Neurologic: Normal    .Assessment/Plan Right index finger open middle phalanx fracture with dorsal laceration and concomitant distal phalanx fracture  Right good finger wound debridement bone stabilization laceration repair and repair as indicated and likely pinning of the finger  R/B/A DISCUSSED WITH PT IN [place]  PT VOICED UNDERSTANDING OF PLAN CONSENT SIGNED DAY OF SURGERY PT SEEN AND EXAMINED PRIOR TO OPERATIVE PROCEDURE/DAY OF SURGERY SITE MARKED. QUESTIONS ANSWERED WILL GO HOME FOLLOWING SURGERY  WE ARE PLANNING SURGERY FOR YOUR UPPER EXTREMITY. THE RISKS AND BENEFITS OF SURGERY INCLUDE BUT NOT  LIMITED TO BLEEDING INFECTION, DAMAGE TO NEARBY NERVES ARTERIES TENDONS, FAILURE OF SURGERY TO ACCOMPLISH ITS INTENDED GOALS, PERSISTENT SYMPTOMS AND NEED FOR FURTHER SURGICAL INTERVENTION. WITH THIS IN MIND WE WILL PROCEED. I HAVE DISCUSSED WITH THE PATIENT THE PRE AND POSTOPERATIVE REGIMEN AND THE DOS AND DON'TS. PT VOICED UNDERSTANDING AND INFORMED CONSENT SIGNED.  Linna Hoff 11/04/2016, 2:47 PM

## 2016-11-04 NOTE — ED Provider Notes (Signed)
[place] DEPT Provider Note   CSN: [id number] Arrival date & time: 11/04/16  1143  By signing my name below, I, Norris Cross, attest that this documentation has been prepared under the direction and in the presence of Caryl Ada, [place]. Electronically Signed: Perlie Mayo. Roselyn Meier. , ED Scribe. 11/04/16. 12:43 PM.   History   Chief Complaint Chief Complaint  Patient presents with  . Extremity Laceration    HPI Tonio E Majerus is a 51 y.o. male who presents to the Emergency Department complaining of R index finger laceration with onset x1 hour. Pt states that while at work moving a large fan, his R index finger was caught in the fan causing a laceration to the dorsal aspect. Pt denies any modifying factors. He denies any other complaints. Of note, pt's tetanus is UTD.   The history is provided by the patient. No language interpreter was used.    Past Medical History:  Diagnosis Date  . Graves disease    dx'd 01/2016  . History of kidney stones   . Hyperthyroidism   . Leukopenia   . 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 Pneumonia ~ 2015 X 1    Patient Active Problem List   Diagnosis Date Noted  . 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 Leukopenia 03/28/2016  . Fever   . Hyperthyroidism   . Abnormal prostate exam 02/10/2015    Past Surgical History:  Procedure Laterality Date  . CLAVICLE SURGERY Left 1980s   has plates and pins  . FRACTURE SURGERY    . LITHOTRIPSY  1990s       Home Medications    Prior to Admission medications   Medication Sig Start Date End Date Taking? Authorizing Provider  acetaminophen (TYLENOL) 650 MG CR tablet Take 1,300 mg by mouth every 8 (eight) hours as needed for pain.    [provider]  propranolol ER (INDERAL LA) 60 MG 24 hr capsule Take 1 capsule (60 mg total) by mouth daily. 04/01/16   Reyne Dumas, MD    Family History Family History  Problem Relation Age of Onset  . Bladder  Cancer Father   . Colon cancer Neg Hx   . Esophageal cancer Neg Hx   . Prostate cancer Neg Hx   . Rectal cancer Neg Hx   . Stomach cancer Neg Hx   . Thyroid disease Neg Hx     Social History Social History  Substance Use Topics  . Smoking status: Former Smoker    Packs/day: 0.50    Years: 20.00    Types: Cigarettes    Quit date: 09/20/2014  . Smokeless tobacco: Former Systems developer    Types: Chew     Comment: "stopped chewing the 1990s"  . Alcohol use No     Allergies   Methimazole   Review of Systems Review of Systems  Skin: Positive for wound (R index finger with laceration).  All other systems reviewed and are negative.    Physical Exam Updated Vital Signs BP (!) 138/95 (BP Location: Left Arm)   Pulse 79   Temp 98.5 F (36.9 C)   Resp 18   SpO2 97%   Physical Exam  Constitutional: He appears well-developed and well-nourished.  HENT:  Head: Normocephalic and atraumatic.  Eyes: Conjunctivae are normal.  Neck: Neck supple.  Cardiovascular: Normal rate and regular rhythm.   No murmur heard. Pulmonary/Chest: Effort normal and breath sounds normal. No respiratory distress.  Abdominal: Soft. There is  no tenderness.  Musculoskeletal: He exhibits no edema.       Right hand: He exhibits laceration. He exhibits normal range of motion.  1 cm laceration to R index finger middle phalynx. Full ROM. Neurovascularly and neurosensory intact.  Neurological: [name] alert.  Skin: Skin is warm and dry.  Psychiatric: He has a normal mood and affect.  Nursing note and vitals reviewed.    ED Treatments / Results   DIAGNOSTIC STUDIES: Oxygen Saturation is 97% on RA, adequate by my interpretation.   COORDINATION OF CARE: 12:44 PM-Discussed next steps with pt. Pt verbalized understanding and is agreeable with the plan.    Labs (all labs ordered are listed, but only abnormal results are displayed) Labs Reviewed - No data to display  EKG  EKG Interpretation None        Radiology Dg Finger Index Right  Result Date: 11/04/2016 CLINICAL DATA:  Right index finger injury and laceration across the PIP joint. Pt was moving a large fan today when the front cover the fan came loose and his hand entered the inside of the case while the fan was spinning. EXAM: RIGHT INDEX FINGER 2+V COMPARISON:  None. FINDINGS: There is a comminuted fracture involving the proximal aspect and articular surface of the middle phalanx of the index finger. There is a transverse fracture of the distal aspect of the distal phalanx. There is associated soft tissue swelling. IMPRESSION: Fractures of the distal phalanx and middle phalanx of the index finger. Electronically Signed   By: Nolon Nations M.D.   On: 11/04/2016 14:00    Procedures Procedures (including critical care time)  Medications Ordered in ED Medications - No data to display   Initial Impression / Assessment and Plan / ED Course  I have reviewed the triage vital signs and the nursing notes.  Pertinent labs & imaging results that were available during my care of the patient were reviewed by me and considered in my medical decision making (see chart for details).     I spoke to Dr. Caralyn Guile who will see pt here.    Final Clinical Impressions(s) / ED Diagnoses   Final diagnoses:  Open displaced fracture of middle phalanx of right index finger, initial encounter    New Prescriptions New Prescriptions   No medications on file      Sidney Ace 11/04/16 1530    Pattricia Boss, MD 11/05/16 1446

## 2016-11-04 NOTE — Anesthesia Postprocedure Evaluation (Signed)
Anesthesia Post Note  Patient: Lance [name]  Procedure(s) Performed: Procedure(s) (LRB): IRRIGATION AND DEBRIDEMENT INDEX FINGER AND PINNING (Right)     Patient location during evaluation: PACU Anesthesia Type: General Level of consciousness: awake, awake and alert and oriented Pain management: pain level controlled Vital Signs Assessment: post-procedure vital signs reviewed and stable Respiratory status: spontaneous breathing, nonlabored ventilation and respiratory function stable Cardiovascular status: blood pressure returned to baseline Anesthetic complications: no    Last Vitals:  Vitals:   11/04/16 1753 11/04/16 1810  BP: 123/86 (P) 120/80  Pulse: 71 (P) 72  Resp: 15 (P) 17  Temp: 36.6 C (P) 36.6 C    Last Pain:  Vitals:   11/04/16 1753  TempSrc:   PainSc: (P) 0-No pain                 Perseus Westall COKER

## 2016-11-04 NOTE — Op Note (Addendum)
PREOPERATIVE DIAGNOSIS: Right index finger open middle phalanx fracture, grade 2 open  Right index finger closed distal phalanx fracture  Right index finger traumatic laceration with extensor tendon, zone 2-3 extensor tendon laceration  POSTOPERATIVE DIAGNOSIS: Same  ATTENDING SURGEON: Dr. Gavin Pound who was scrubbed and present for the entire procedure  ASSISTANT SURGEON: None  ANESTHESIA: Gen. via LMA  OPERATIVE PROCEDURE: 1.Right index finger open treatment of middle phalangeal intra-articular fracture involving the proximal interphalangeal joint with internal fixation 2. Open debridement right middle phalanx fracture index finger skin subcutaneous tissue and bone associated with open fracture 3. Right index finger extensor tendon repair zone 2/3 4. Traumatic laceration repair right index finger 3 cm 5. Right index finger percutaneous skeletal fixation unstable distal phalanx fracture 6. Radiographs 3 views right index finger  IMPLANTS: 2 0.045 K wires, one 0.035 K wire  RADIOGRAPHIC INTERPRETATION: AP lateral oblique views of the finger do show the K wire fixation and good position there is good joint alignment in both planes  SURGICAL INDICATIONS: Mr.Arno is a right-hand-dominant gentleman who sustained the open injury to the right index finger middle phalanx. Patient was seen and evaluated in the emergency department/hospital recommended under undergo the above procedure. Risks benefits and alternatives were discussed in detail with the patient and signed informed consent was obtained. Risks include but not limited to bleeding infection damage to nearby nerves arteries or tendons loss of motion of the wrists and digits incomplete relief of symptoms nonunion malunion malrotation and need for further surgical intervention.  SURGICAL TECHNIQUE: Patient is properly identified in the preoperative holding area and a mark with a permanent marker made on the right index finger to indicate  the correct operative site. Patient is then brought back to the operating room placed supine on anesthesia and table where general anesthesia was administered. Preoperative antibiotics were given. A well-padded tourniquet was then placed on the right brachium and sealed with a 1000 drape. The right upper extremity was then prepped and draped in normal sterile fashion. Timeout was called cracks I was identified and the procedure was then begun. Attention then turned to the index finger the 3 cm traumatic laceration directly over the central slip and middle phalanx was then extended proximally and distally. Skin flaps were then raised. The fracture site was then opened up. This was an open fracture. Excisional debridement of the skin subcutaneous tissue and bone without any soft tissue attachments was then debrided sharply with small curettes and a knife. The wound was then thoroughly irrigated. Thorough irrigation of the fracture site was then done. 20.045 K wires were then placed retrograde. There were delivered out through the middle phalanx proximally. The fracture was then reduced and the K wires were advanced across the fracture site reducing the fracture nicely. This was an intra-articular fracture involving the proximal interphalangeal joint. The K wires were cut and left beneath the skin. After the fracture was stabilized the attention was then turned to the distal phalangeal fracture or 0.035 K wire was then placed retrograde across the fracture site and across the distal interphalangeal joint. This K wire was then cut and bent left out of the skin. Following this the extensor mechanism right at the level of the central slip was then repaired with a running locking 4-0 Mersilene suture. Supplemented by several figure-of-eight sutures. The lateral bands were brought dorsally to reinforce the repair. The wound was irrigated. The traumatic laceration was then closed with 4-0 Prolene suture. Adaptic dressing a  sterile  compressive bandage was then applied. 5 mL of quarter percent Marcaine infiltrated proximally, flexor sheath block. Sterile compressive bandage is applied. The patient was placed in a small finger splint and extubated taken recovery room in good condition.  POSTOPERATIVE PLAN: Patient is be discharged to home seen back now office in 5 days for wound check down to see her therapist for a finger splint immobilizing the DIP and PIP joint but allowing MP movement. Radiographs at each visit. Plan to see him back to five-day mark 15 and a mark and four-week mark. At the four-week mark we'll plan on removing the K wires across the middle phalangeal fracture. Radiographs of the finger at each visit. We'll take the K wire going across the distal phalanx fracture of the four-week mark. Pain medicines oral antibiotics

## 2016-11-04 NOTE — Discharge Instructions (Signed)
KEEP BANDAGE CLEAN AND DRY CALL OFFICE FOR F/U APPT [phone] in 5 days KEEP HAND ELEVATED ABOVE HEART OK TO APPLY ICE TO OPERATIVE AREA CONTACT OFFICE IF ANY WORSENING PAIN OR CONCERNS.  

## 2016-11-04 NOTE — ED Triage Notes (Signed)
Pt states a fan cut his right index finger today, bleeding controlled and wrapped with gauze. Tip of finger appears cyanotic, + sensation

## 2016-11-04 NOTE — Anesthesia Preprocedure Evaluation (Addendum)
Anesthesia Evaluation  Patient identified by MRN, date of birth, ID band Patient awake    Reviewed: Allergy & Precautions, NPO status , Patient's Chart, lab work & pertinent test results  Airway Mallampati: II  TM Distance: >3 FB Neck ROM: Full    Dental  (+) Teeth Intact, Dental Advisory Given   Pulmonary former smoker,    breath sounds clear to auscultation       Cardiovascular  Rhythm:Regular     Neuro/Psych    GI/Hepatic   Endo/Other    Renal/GU      Musculoskeletal   Abdominal   Peds  Hematology   Anesthesia Other Findings   Reproductive/Obstetrics                             Anesthesia Physical Anesthesia Plan  ASA: II  Anesthesia Plan: General   Post-op Pain Management:    Induction: Intravenous  PONV Risk Score and Plan: Ondansetron  Airway Management Planned: Oral ETT  Additional Equipment:   Intra-op Plan:   Post-operative Plan: Extubation in OR  Informed Consent: I have reviewed the patients History and Physical, chart, labs and discussed the procedure including the risks, benefits and alternatives for the proposed anesthesia with the patient or authorized representative who has indicated his/her understanding and acceptance.   Dental advisory given  Plan Discussed with: CRNA and Anesthesiologist  Anesthesia Plan Comments:         Anesthesia Quick Evaluation

## 2016-11-05 ENCOUNTER — Encounter (HOSPITAL_COMMUNITY): Payer: Self-pay | Admitting: Orthopedic Surgery

## 2016-12-18 DIAGNOSIS — Z125 Encounter for screening for malignant neoplasm of prostate: Secondary | ICD-10-CM | POA: Diagnosis not present

## 2016-12-18 DIAGNOSIS — E0590 Thyrotoxicosis, unspecified without thyrotoxic crisis or storm: Secondary | ICD-10-CM | POA: Diagnosis not present

## 2016-12-18 DIAGNOSIS — Z0000 Encounter for general adult medical examination without abnormal findings: Secondary | ICD-10-CM | POA: Diagnosis not present

## 2016-12-20 DIAGNOSIS — Z0000 Encounter for general adult medical examination without abnormal findings: Secondary | ICD-10-CM | POA: Diagnosis not present

## 2016-12-20 DIAGNOSIS — E0500 Thyrotoxicosis with diffuse goiter without thyrotoxic crisis or storm: Secondary | ICD-10-CM | POA: Diagnosis not present

## 2016-12-20 DIAGNOSIS — L0390 Cellulitis, unspecified: Secondary | ICD-10-CM | POA: Diagnosis not present

## 2016-12-20 DIAGNOSIS — R740 Nonspecific elevation of levels of transaminase and lactic acid dehydrogenase [LDH]: Secondary | ICD-10-CM | POA: Diagnosis not present

## 2016-12-20 DIAGNOSIS — Z1389 Encounter for screening for other disorder: Secondary | ICD-10-CM | POA: Diagnosis not present

## 2016-12-20 DIAGNOSIS — E784 Other hyperlipidemia: Secondary | ICD-10-CM | POA: Diagnosis not present

## 2016-12-22 DIAGNOSIS — Z1212 Encounter for screening for malignant neoplasm of rectum: Secondary | ICD-10-CM | POA: Diagnosis not present

## 2017-01-30 DIAGNOSIS — E890 Postprocedural hypothyroidism: Secondary | ICD-10-CM | POA: Diagnosis not present

## 2017-02-01 DIAGNOSIS — E890 Postprocedural hypothyroidism: Secondary | ICD-10-CM | POA: Diagnosis not present

## 2017-02-08 ENCOUNTER — Ambulatory Visit (INDEPENDENT_AMBULATORY_CARE_PROVIDER_SITE_OTHER): Payer: BLUE CROSS/BLUE SHIELD | Admitting: Neurology

## 2017-02-08 ENCOUNTER — Encounter: Payer: Self-pay | Admitting: Neurology

## 2017-02-08 VITALS — BP 126/87 | HR 69 | Ht 68.0 in | Wt 174.0 lb

## 2017-02-08 DIAGNOSIS — E0500 Thyrotoxicosis with diffuse goiter without thyrotoxic crisis or storm: Secondary | ICD-10-CM

## 2017-02-08 DIAGNOSIS — R51 Headache: Secondary | ICD-10-CM

## 2017-02-08 DIAGNOSIS — R42 Dizziness and giddiness: Secondary | ICD-10-CM | POA: Diagnosis not present

## 2017-02-08 DIAGNOSIS — H81399 Other peripheral vertigo, unspecified ear: Secondary | ICD-10-CM | POA: Diagnosis not present

## 2017-02-08 DIAGNOSIS — R519 Headache, unspecified: Secondary | ICD-10-CM

## 2017-02-08 NOTE — Progress Notes (Signed)
Subjective:    Patient ID: Lance [name] is a 51 y.o. male.  HPI     Lance Age, MD, PhD [facility] [address], [address] [place], [place] [address]  Dear Dr. Chalmers Cater,   I saw your patient, Lance [name], upon your kind request in my neurologic clinic today for initial consultation of his recurrent dizziness. The patient is unaccompanied today. As you know, Lance [name] is a 50 year old right-handed gentleman with an underlying medical history of hypothyroidism, kidney stones, history of Graves' disease, history of neutropenia, prior history of pneumonia, who reports intermittent spells of dizziness for the past 2 months or so. He feels a very brief sensation of lightheadedness, like he is going to pass out, which stops him in his tracks but he rides it out. Typically it is not with position change. [name] typically standing when this happens. He has not fallen but feels like he could pass out if he is not careful. When he first got diagnosed with Graves' disease in November 2017 he woke up with vertigo and double vision. Since then, he has a fleeting sense of double vision sometimes but not nearly as bad, he has been checked out for his eyes and has prescription eyeglasses but has been told that otherwise he does not have any signs of Graves' disease having affected his eyes. He had palpitations and  Does tachycardia when he first got diagnosed with Graves' disease and was placed on Inderal at the time. He no longer takes it. He does not have a history of recurrent migraines but does have the occasional aching in the posterior head area, at the base of the skull, more on the left side. It helps to massage that area. He does not sleep very well. He has been on Ambien for years. He does not have a family history of Graves' disease. I reviewed your office note from 02/01/2017, which you kindly included. He had a brain MRI without contrast on 05/20/2014 which I reviewed:  IMPRESSION: No acute abnormality. Scattered small white matter hyperintensities are typical for [name] and may be related to chronic microvascular ischemia or migraine headache.  His Past Medical History Is Significant For: Past Medical History:  Diagnosis Date  . Graves disease    dx'd 01/2016  . History of kidney stones   . Hyperthyroidism   . Hypothyroid   . Leukopenia   . Neutropenia ([place])   . 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 Pneumonia ~ 2015 X 1    His Past Surgical History Is Significant For: Past Surgical History:  Procedure Laterality Date  . CLAVICLE SURGERY Left 1980s   has plates and pins  . FRACTURE SURGERY    . I&D EXTREMITY Right 11/04/2016   Procedure: IRRIGATION AND DEBRIDEMENT INDEX FINGER AND PINNING;  Surgeon: Iran Planas, MD;  Location: [facility];  Service: Orthopedics;  Laterality: Right;  . LITHOTRIPSY  1990s    His Family History Is Significant For: Family History  Problem Relation [name] of Onset  . Bladder Cancer Father   . Hypertension Father   . Colon cancer Neg Hx   . Esophageal cancer Neg Hx   . Prostate cancer Neg Hx   . Rectal cancer Neg Hx   . Stomach cancer Neg Hx   . Thyroid disease Neg Hx     His Social History Is Significant For: Social History   Social History  . Marital status: Married    Spouse name: N/A  .  Number of children: N/A  . Years of education: N/A   Social History Main Topics  . Smoking status: Former Smoker    Packs/day: 0.50    Years: 20.00    Types: Cigarettes    Quit date: 09/20/2014  . Smokeless tobacco: Former Systems developer    Types: Chew     Comment: "stopped chewing the 1990s"  . Alcohol use No  . Drug use: No  . Sexual activity: Yes   Other Topics Concern  . None   Social History Narrative  . None    His Allergies Are:  Allergies  Allergen Reactions  . Methimazole     WBC drops  :   His Current Medications Are:  Outpatient Encounter Prescriptions as of 02/08/2017  Medication Sig  . levothyroxine  (SYNTHROID, LEVOTHROID) 75 MCG tablet Take 75 mcg by mouth daily before breakfast.  . zolpidem (AMBIEN) 10 MG tablet Take 10 mg by mouth at bedtime as needed for sleep.  . [DISCONTINUED] docusate sodium (COLACE) 100 MG capsule Take 1 capsule (100 mg total) by mouth 2 (two) times daily.  . [DISCONTINUED] propranolol ER (INDERAL LA) 60 MG 24 hr capsule Take 1 capsule (60 mg total) by mouth daily.   No facility-administered encounter medications on file as of 02/08/2017.   :   Review of Systems:  Out of a complete 14 point review of systems, all are reviewed and negative with the exception of these symptoms as listed below: Review of Systems  Neurological:       Pt presents today to discuss his dizzy spells which happen 3-4 times per week.    Objective:  Neurological Exam  Physical Exam Physical Examination:   Vitals:   02/08/17 1453  BP: 126/87  Pulse: 69   Orthostatic vital signs are benign, he does not have any vertigo type symptoms upon changing position today, no lightheadedness upon standing but feels slightly dizzy when closing his eyes today.  General Examination: The patient is a very pleasant 51 y.o. male in no acute distress. He appears well-developed and well-nourished and well groomed. Mildly anxious appearing.   HEENT: Normocephalic, atraumatic, pupils are equal, round and reactive to light and accommodation. Corrective eye glasses. Extraocular tracking is good without limitation to gaze excursion or nystagmus noted. Normal smooth pursuit is noted. Hearing is grossly intact. Face is symmetric with normal facial animation and normal facial sensation. Speech is clear with no dysarthria noted. There is no hypophonia. There is no lip, neck/head, jaw or voice tremor. Neck is supple with full range of passive and active motion. There are no carotid bruits on auscultation. Oropharynx exam reveals: non-focal exam.   Chest: Clear to auscultation without wheezing, rhonchi or crackles  noted.  Heart: S1+S2+0, regular and normal without murmurs, rubs or gallops noted.   Abdomen: Soft, non-tender and non-distended with normal bowel sounds appreciated on auscultation.  Extremities: There is no pitting edema in the distal lower extremities bilaterally. Pedal pulses are intact.  Skin: Warm and dry without trophic changes noted.  Musculoskeletal: exam reveals no obvious joint deformities, tenderness or joint swelling or erythema.   Neurologically:  Mental status: The patient is awake, alert and oriented in all 4 spheres. His immediate and remote memory, attention, language skills and fund of knowledge are appropriate. There is no evidence of aphasia, agnosia, apraxia or anomia. Speech is clear with normal prosody and enunciation. Thought process is linear. Mood is normal and affect is normal.  Cranial nerves II - XII are  as described above under HEENT exam. In addition: shoulder shrug is normal with equal shoulder height noted. Motor exam: Normal bulk, strength and tone is noted. There is no drift, tremor or rebound. Romberg is negative. Reflexes are 2+ throughout. Babinski: Toes are flexor bilaterally. Fine motor skills and coordination: intact with normal finger taps, normal hand movements, normal rapid alternating patting, normal foot taps and normal foot agility.  Cerebellar testing: No dysmetria or intention tremor on finger to nose testing. Heel to shin is unremarkable bilaterally. There is no truncal or gait ataxia.  Sensory exam: intact to light touch, pinprick, vibration, temperature sense in the upper and lower extremities.  Gait, station and balance: He stands easily. No veering to one side is noted. No leaning to one side is noted. Posture is [name]-appropriate and stance is narrow based. Gait shows normal stride length and normal pace. No problems turning are noted. Tandem walk is unremarkable.   Assessment and Plan:    In summary, Jasaun E Delaguila is a very pleasant 51  y.o.-year old male with an underlying medical history of hypothyroidism, kidney stones, history of Graves' disease, history of neutropenia, prior history of pneumonia, who Presents for neurologic consultation of intermittent dizzy spells. He reports lightheadedness, typically while standing. He may have blood pressure fluctuations but today on exam he has benign orthostatic values and a normal neurological exam. [name] reassured in this regard. [name] advised to change positions slowly and stay well hydrated, well rested. We will proceed with a brain MRI with and without contrast for his history of Graves' disease and rule out a structural cause from the neurological standpoint. If his MRI is normal, [name]-appropriate, I can see him back on an as-needed basis.  I answered all his questions today and the patient was in agreement with the above plan.   Thank you very much for allowing me to participate in the care of this nice patient. If I can be of any further assistance to you please do not hesitate to call me at [phone].  Sincerely,   Lance Age, MD, PhD

## 2017-02-08 NOTE — Patient Instructions (Addendum)
Your exam looks good, which is of course reassuring.  Unfortunately, dizziness is a very common complaint but is often not due to a primary neurological reason or single underlying medical problem. Often, there a combination of factors, that result in dizziness. This includes blood pressure fluctuations, medication side effects, blood sugar fluctuations, stress, vertigo, poor sleep with sleep deprivation, dehydration, and electrolyte disturbance or other metabolic and endocrinological reasons, meaning hormone related problems such as thyroid dysfunction. We will investigate things further with a brain MRI. We will call you with the test results. So long as the MRI is benign/reassuring, I will see you back as needed.

## 2017-04-09 DIAGNOSIS — E0500 Thyrotoxicosis with diffuse goiter without thyrotoxic crisis or storm: Secondary | ICD-10-CM | POA: Diagnosis not present

## 2017-04-09 DIAGNOSIS — H524 Presbyopia: Secondary | ICD-10-CM | POA: Diagnosis not present

## 2017-08-01 DIAGNOSIS — E890 Postprocedural hypothyroidism: Secondary | ICD-10-CM | POA: Diagnosis not present

## 2017-12-21 DIAGNOSIS — Z0000 Encounter for general adult medical examination without abnormal findings: Secondary | ICD-10-CM | POA: Diagnosis not present

## 2017-12-21 DIAGNOSIS — E7849 Other hyperlipidemia: Secondary | ICD-10-CM | POA: Diagnosis not present

## 2017-12-21 DIAGNOSIS — Z125 Encounter for screening for malignant neoplasm of prostate: Secondary | ICD-10-CM | POA: Diagnosis not present

## 2017-12-21 DIAGNOSIS — E0500 Thyrotoxicosis with diffuse goiter without thyrotoxic crisis or storm: Secondary | ICD-10-CM | POA: Diagnosis not present

## 2017-12-24 DIAGNOSIS — K644 Residual hemorrhoidal skin tags: Secondary | ICD-10-CM | POA: Diagnosis not present

## 2017-12-24 DIAGNOSIS — H02409 Unspecified ptosis of unspecified eyelid: Secondary | ICD-10-CM | POA: Diagnosis not present

## 2017-12-24 DIAGNOSIS — E032 Hypothyroidism due to medicaments and other exogenous substances: Secondary | ICD-10-CM | POA: Diagnosis not present

## 2017-12-24 DIAGNOSIS — Z1389 Encounter for screening for other disorder: Secondary | ICD-10-CM | POA: Diagnosis not present

## 2017-12-24 DIAGNOSIS — R42 Dizziness and giddiness: Secondary | ICD-10-CM | POA: Diagnosis not present

## 2017-12-24 DIAGNOSIS — Z0000 Encounter for general adult medical examination without abnormal findings: Secondary | ICD-10-CM | POA: Diagnosis not present

## 2017-12-26 DIAGNOSIS — Z1212 Encounter for screening for malignant neoplasm of rectum: Secondary | ICD-10-CM | POA: Diagnosis not present

## 2018-01-28 DIAGNOSIS — E890 Postprocedural hypothyroidism: Secondary | ICD-10-CM | POA: Diagnosis not present

## 2018-01-30 DIAGNOSIS — E890 Postprocedural hypothyroidism: Secondary | ICD-10-CM | POA: Diagnosis not present

## 2018-03-08 IMAGING — CR DG CHEST 2V
2 series · 2 of 2 positions shown · non-contrast
Comparison: 03/18/2012

CLINICAL DATA: Fever and headache for 5 days.

EXAM:
CHEST  2 VIEW

[chest pa]
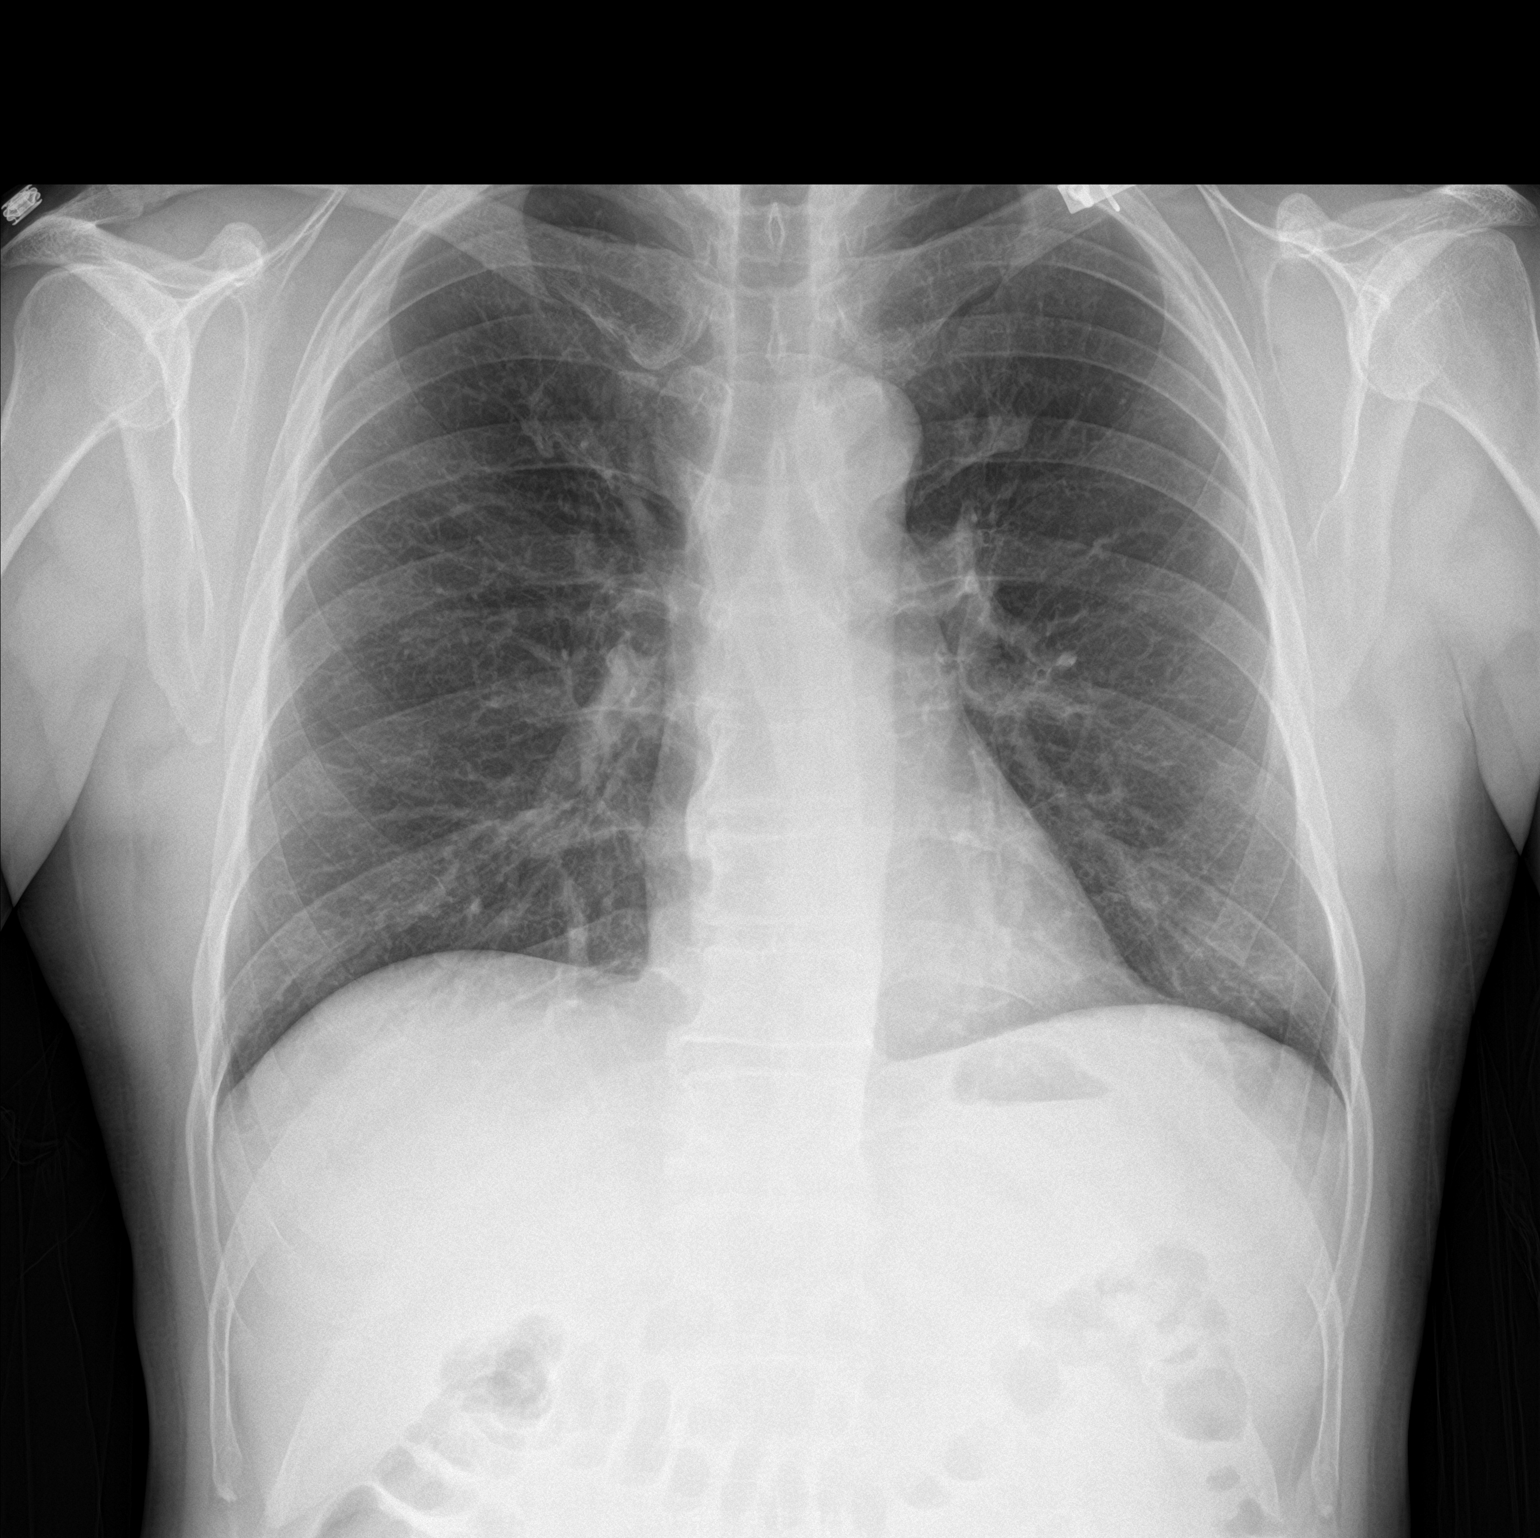

[chest lat]
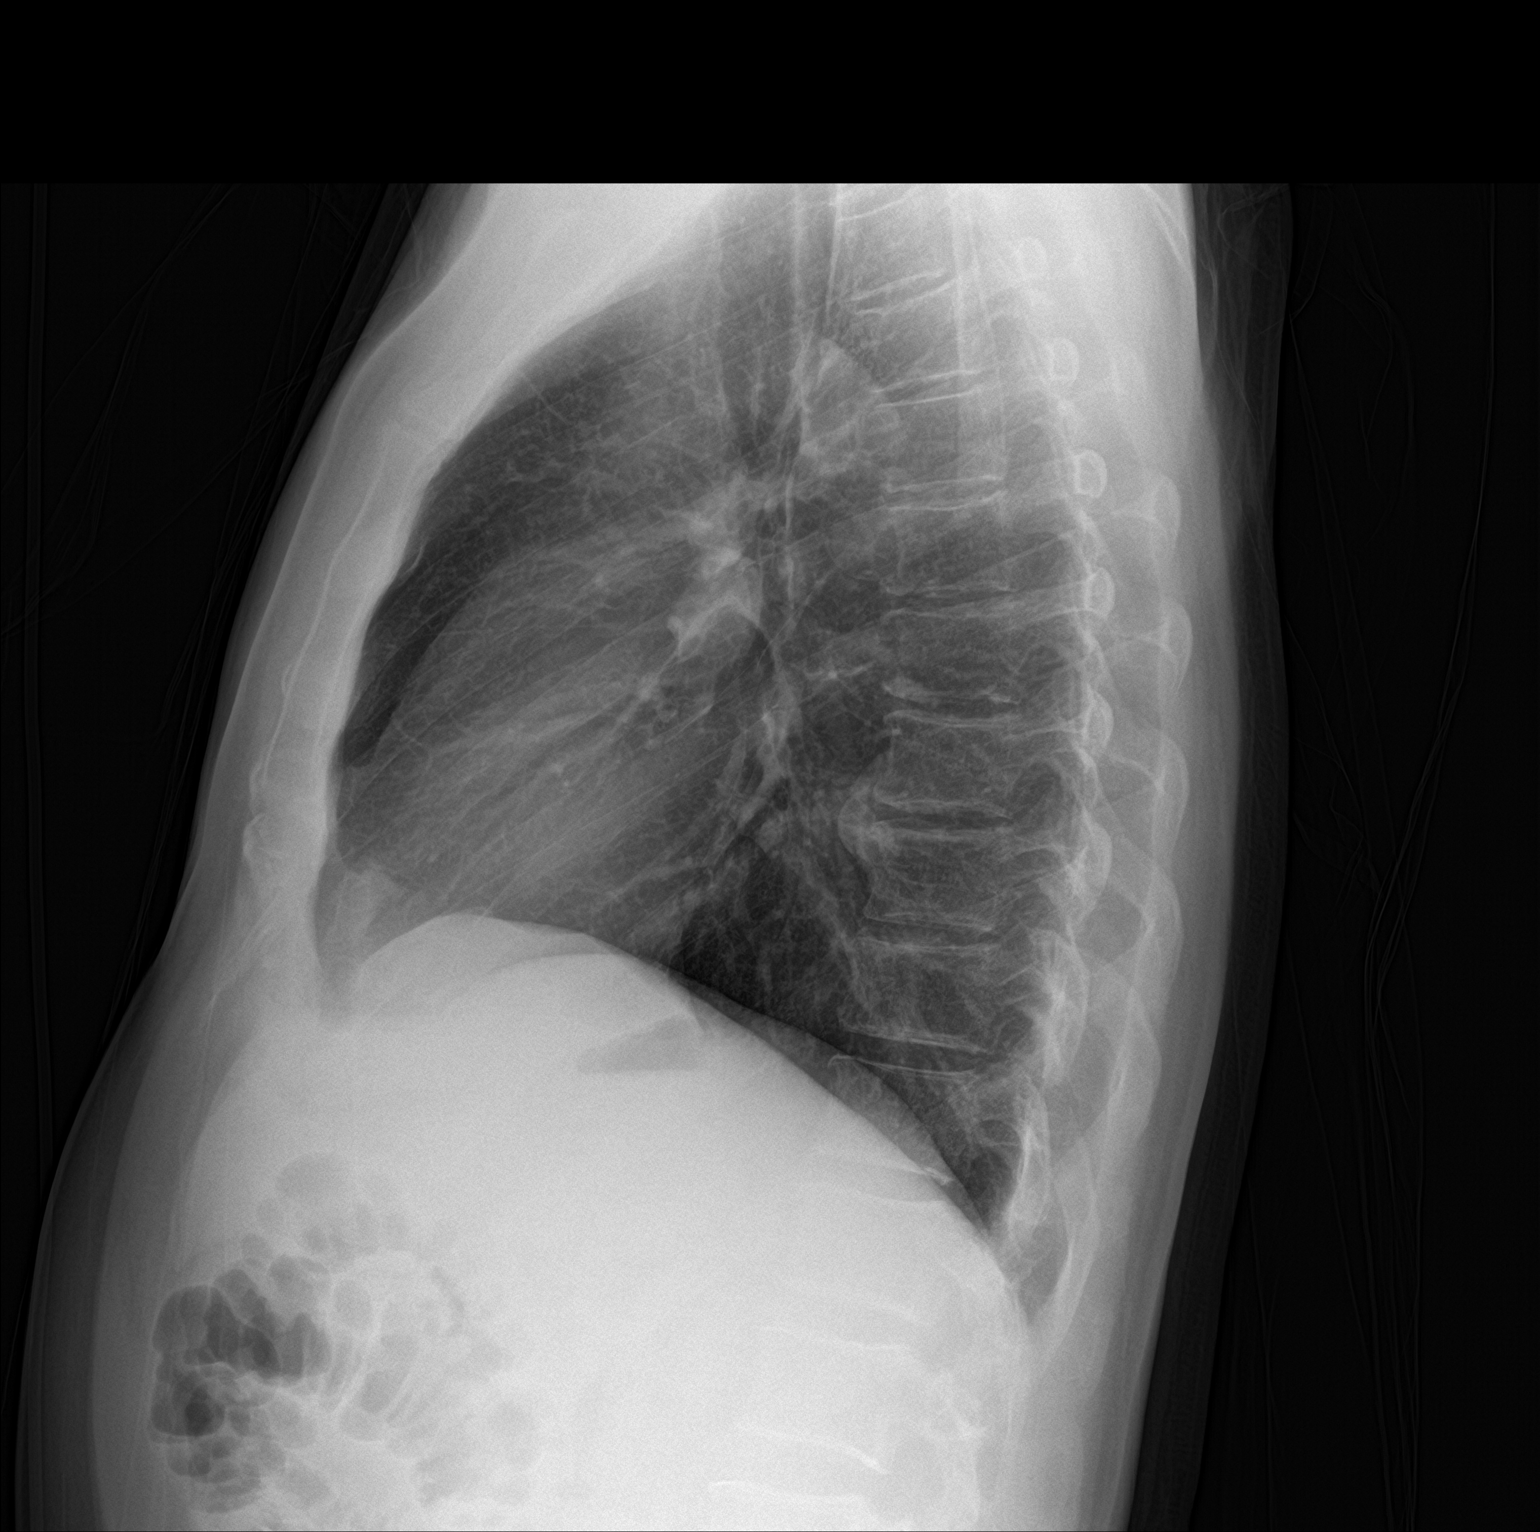

[2 of 2 positions shown; findings below may reference images not displayed]

FINDINGS: Again noted is a surgical plate in the left clavicle. Both lungs are
clear. Heart and mediastinum are within normal limits. The trachea
is midline. Mild degenerative changes in lower thoracic spine. No
pleural effusions.
IMPRESSION: No active cardiopulmonary disease.

## 2018-05-18 DIAGNOSIS — M5412 Radiculopathy, cervical region: Secondary | ICD-10-CM | POA: Diagnosis not present

## 2018-07-24 DIAGNOSIS — R6889 Other general symptoms and signs: Secondary | ICD-10-CM | POA: Diagnosis not present

## 2018-08-04 DIAGNOSIS — M545 Low back pain: Secondary | ICD-10-CM | POA: Diagnosis not present

## 2018-10-21 DIAGNOSIS — M25569 Pain in unspecified knee: Secondary | ICD-10-CM | POA: Diagnosis not present

## 2018-12-26 DIAGNOSIS — Z125 Encounter for screening for malignant neoplasm of prostate: Secondary | ICD-10-CM | POA: Diagnosis not present

## 2018-12-26 DIAGNOSIS — Z0000 Encounter for general adult medical examination without abnormal findings: Secondary | ICD-10-CM | POA: Diagnosis not present

## 2018-12-26 DIAGNOSIS — R7989 Other specified abnormal findings of blood chemistry: Secondary | ICD-10-CM | POA: Diagnosis not present

## 2018-12-26 DIAGNOSIS — E0500 Thyrotoxicosis with diffuse goiter without thyrotoxic crisis or storm: Secondary | ICD-10-CM | POA: Diagnosis not present

## 2019-01-02 DIAGNOSIS — R740 Nonspecific elevation of levels of transaminase and lactic acid dehydrogenase [LDH]: Secondary | ICD-10-CM | POA: Diagnosis not present

## 2019-01-02 DIAGNOSIS — Z0000 Encounter for general adult medical examination without abnormal findings: Secondary | ICD-10-CM | POA: Diagnosis not present

## 2019-01-02 DIAGNOSIS — Z1331 Encounter for screening for depression: Secondary | ICD-10-CM | POA: Diagnosis not present

## 2019-01-02 DIAGNOSIS — E785 Hyperlipidemia, unspecified: Secondary | ICD-10-CM | POA: Diagnosis not present

## 2019-01-02 DIAGNOSIS — E032 Hypothyroidism due to medicaments and other exogenous substances: Secondary | ICD-10-CM | POA: Diagnosis not present

## 2019-01-02 DIAGNOSIS — E0500 Thyrotoxicosis with diffuse goiter without thyrotoxic crisis or storm: Secondary | ICD-10-CM | POA: Diagnosis not present

## 2019-02-01 DIAGNOSIS — B029 Zoster without complications: Secondary | ICD-10-CM | POA: Diagnosis not present

## 2019-02-09 DIAGNOSIS — M25562 Pain in left knee: Secondary | ICD-10-CM | POA: Diagnosis not present

## 2019-05-25 DIAGNOSIS — J069 Acute upper respiratory infection, unspecified: Secondary | ICD-10-CM | POA: Diagnosis not present

## 2019-05-25 DIAGNOSIS — Z03818 Encounter for observation for suspected exposure to other biological agents ruled out: Secondary | ICD-10-CM | POA: Diagnosis not present

## 2019-07-18 DIAGNOSIS — R413 Other amnesia: Secondary | ICD-10-CM | POA: Diagnosis not present

## 2019-07-18 DIAGNOSIS — E032 Hypothyroidism due to medicaments and other exogenous substances: Secondary | ICD-10-CM | POA: Diagnosis not present

## 2019-07-18 DIAGNOSIS — R300 Dysuria: Secondary | ICD-10-CM | POA: Diagnosis not present

## 2019-07-18 DIAGNOSIS — E0500 Thyrotoxicosis with diffuse goiter without thyrotoxic crisis or storm: Secondary | ICD-10-CM | POA: Diagnosis not present

## 2019-07-22 DIAGNOSIS — E0500 Thyrotoxicosis with diffuse goiter without thyrotoxic crisis or storm: Secondary | ICD-10-CM | POA: Diagnosis not present

## 2019-07-22 DIAGNOSIS — R946 Abnormal results of thyroid function studies: Secondary | ICD-10-CM | POA: Diagnosis not present

## 2019-09-09 DIAGNOSIS — R946 Abnormal results of thyroid function studies: Secondary | ICD-10-CM | POA: Diagnosis not present

## 2020-01-20 DIAGNOSIS — Z125 Encounter for screening for malignant neoplasm of prostate: Secondary | ICD-10-CM | POA: Diagnosis not present

## 2020-01-20 DIAGNOSIS — E785 Hyperlipidemia, unspecified: Secondary | ICD-10-CM | POA: Diagnosis not present

## 2020-01-20 DIAGNOSIS — E032 Hypothyroidism due to medicaments and other exogenous substances: Secondary | ICD-10-CM | POA: Diagnosis not present

## 2020-01-20 DIAGNOSIS — Z0000 Encounter for general adult medical examination without abnormal findings: Secondary | ICD-10-CM | POA: Diagnosis not present

## 2020-01-27 DIAGNOSIS — Z1212 Encounter for screening for malignant neoplasm of rectum: Secondary | ICD-10-CM | POA: Diagnosis not present

## 2020-01-27 DIAGNOSIS — R82998 Other abnormal findings in urine: Secondary | ICD-10-CM | POA: Diagnosis not present

## 2020-01-27 DIAGNOSIS — Z0000 Encounter for general adult medical examination without abnormal findings: Secondary | ICD-10-CM | POA: Diagnosis not present

## 2020-01-27 DIAGNOSIS — G4700 Insomnia, unspecified: Secondary | ICD-10-CM | POA: Diagnosis not present

## 2020-04-13 DIAGNOSIS — E0500 Thyrotoxicosis with diffuse goiter without thyrotoxic crisis or storm: Secondary | ICD-10-CM | POA: Diagnosis not present

## 2020-04-13 DIAGNOSIS — D3131 Benign neoplasm of right choroid: Secondary | ICD-10-CM | POA: Diagnosis not present

## 2020-04-13 DIAGNOSIS — H5203 Hypermetropia, bilateral: Secondary | ICD-10-CM | POA: Diagnosis not present

## 2020-05-06 DIAGNOSIS — L814 Other melanin hyperpigmentation: Secondary | ICD-10-CM | POA: Diagnosis not present

## 2020-05-06 DIAGNOSIS — L723 Sebaceous cyst: Secondary | ICD-10-CM | POA: Diagnosis not present

## 2020-05-06 DIAGNOSIS — L573 Poikiloderma of Civatte: Secondary | ICD-10-CM | POA: Diagnosis not present

## 2020-06-10 DIAGNOSIS — U071 COVID-19: Secondary | ICD-10-CM | POA: Diagnosis not present

## 2020-06-10 DIAGNOSIS — R0981 Nasal congestion: Secondary | ICD-10-CM | POA: Diagnosis not present

## 2020-06-24 DIAGNOSIS — L720 Epidermal cyst: Secondary | ICD-10-CM | POA: Diagnosis not present

## 2020-08-02 DIAGNOSIS — M47814 Spondylosis without myelopathy or radiculopathy, thoracic region: Secondary | ICD-10-CM | POA: Diagnosis not present

## 2020-08-02 DIAGNOSIS — R350 Frequency of micturition: Secondary | ICD-10-CM | POA: Diagnosis not present

## 2020-08-02 DIAGNOSIS — M546 Pain in thoracic spine: Secondary | ICD-10-CM | POA: Diagnosis not present

## 2021-01-25 DIAGNOSIS — E785 Hyperlipidemia, unspecified: Secondary | ICD-10-CM | POA: Diagnosis not present

## 2021-01-25 DIAGNOSIS — E039 Hypothyroidism, unspecified: Secondary | ICD-10-CM | POA: Diagnosis not present

## 2021-01-31 DIAGNOSIS — Z1331 Encounter for screening for depression: Secondary | ICD-10-CM | POA: Diagnosis not present

## 2021-01-31 DIAGNOSIS — E785 Hyperlipidemia, unspecified: Secondary | ICD-10-CM | POA: Diagnosis not present

## 2021-01-31 DIAGNOSIS — Z0000 Encounter for general adult medical examination without abnormal findings: Secondary | ICD-10-CM | POA: Diagnosis not present

## 2021-01-31 DIAGNOSIS — Z1339 Encounter for screening examination for other mental health and behavioral disorders: Secondary | ICD-10-CM | POA: Diagnosis not present

## 2021-01-31 DIAGNOSIS — Z125 Encounter for screening for malignant neoplasm of prostate: Secondary | ICD-10-CM | POA: Diagnosis not present

## 2021-03-10 DIAGNOSIS — Z23 Encounter for immunization: Secondary | ICD-10-CM | POA: Diagnosis not present

## 2021-04-12 DIAGNOSIS — D3131 Benign neoplasm of right choroid: Secondary | ICD-10-CM | POA: Diagnosis not present

## 2021-04-12 DIAGNOSIS — H524 Presbyopia: Secondary | ICD-10-CM | POA: Diagnosis not present

## 2021-04-12 DIAGNOSIS — E0500 Thyrotoxicosis with diffuse goiter without thyrotoxic crisis or storm: Secondary | ICD-10-CM | POA: Diagnosis not present

## 2021-04-29 DIAGNOSIS — J019 Acute sinusitis, unspecified: Secondary | ICD-10-CM | POA: Diagnosis not present

## 2021-04-29 DIAGNOSIS — J209 Acute bronchitis, unspecified: Secondary | ICD-10-CM | POA: Diagnosis not present

## 2021-11-21 DIAGNOSIS — H9191 Unspecified hearing loss, right ear: Secondary | ICD-10-CM | POA: Diagnosis not present

## 2021-11-21 DIAGNOSIS — H6123 Impacted cerumen, bilateral: Secondary | ICD-10-CM | POA: Diagnosis not present

## 2022-01-05 ENCOUNTER — Ambulatory Visit (HOSPITAL_COMMUNITY)
Admission: RE | Admit: 2022-01-05 | Discharge: 2022-01-05 | Disposition: A | Discharge status: Home / Self Care | Payer: BC Managed Care – PPO | Source: Ambulatory Visit | Attending: Internal Medicine | Admitting: Internal Medicine

## 2022-01-05 ENCOUNTER — Other Ambulatory Visit (HOSPITAL_COMMUNITY): Payer: Self-pay | Admitting: Internal Medicine

## 2022-01-05 DIAGNOSIS — H532 Diplopia: Secondary | ICD-10-CM | POA: Insufficient documentation

## 2022-01-05 DIAGNOSIS — G4452 New daily persistent headache (NDPH): Secondary | ICD-10-CM | POA: Insufficient documentation

## 2022-01-05 DIAGNOSIS — R519 Headache, unspecified: Secondary | ICD-10-CM | POA: Diagnosis not present

## 2022-01-05 DIAGNOSIS — E039 Hypothyroidism, unspecified: Secondary | ICD-10-CM | POA: Diagnosis not present

## 2022-01-05 MED ORDER — GADOBUTROL 1 MMOL/ML IV SOLN
8.0000 mL | Freq: Once | INTRAVENOUS | Status: AC | PRN
  Administered 2022-01-05: 8 mL via INTRAVENOUS

## 2022-01-17 DIAGNOSIS — H532 Diplopia: Secondary | ICD-10-CM | POA: Diagnosis not present

## 2022-01-19 ENCOUNTER — Encounter: Payer: Self-pay | Admitting: *Deleted

## 2022-01-24 ENCOUNTER — Ambulatory Visit (INDEPENDENT_AMBULATORY_CARE_PROVIDER_SITE_OTHER): Payer: BC Managed Care – PPO | Admitting: Neurology

## 2022-01-24 ENCOUNTER — Encounter: Payer: Self-pay | Admitting: Neurology

## 2022-01-24 VITALS — BP 140/93 | HR 72 | Ht 68.0 in | Wt 172.0 lb

## 2022-01-24 DIAGNOSIS — G43109 Migraine with aura, not intractable, without status migrainosus: Secondary | ICD-10-CM | POA: Diagnosis not present

## 2022-01-24 DIAGNOSIS — R351 Nocturia: Secondary | ICD-10-CM

## 2022-01-24 DIAGNOSIS — H532 Diplopia: Secondary | ICD-10-CM | POA: Diagnosis not present

## 2022-01-24 DIAGNOSIS — R0683 Snoring: Secondary | ICD-10-CM | POA: Diagnosis not present

## 2022-01-24 DIAGNOSIS — R519 Headache, unspecified: Secondary | ICD-10-CM

## 2022-01-24 DIAGNOSIS — G4700 Insomnia, unspecified: Secondary | ICD-10-CM | POA: Diagnosis not present

## 2022-01-24 MED ORDER — RIZATRIPTAN BENZOATE 5 MG PO TBDP: 5.0000 mg | ORAL_TABLET | ORAL | 1 refills | Status: DC | PRN

## 2022-01-24 NOTE — Patient Instructions (Signed)
It was nice to see you today.  I do believe you have migraine headaches.    Please remember, common headache triggers are: sleep deprivation, dehydration, overheating, stress, hypoglycemia or skipping meals and blood sugar fluctuations, excessive pain medications or excessive alcohol use or caffeine withdrawal. Some people have food triggers such as aged cheese, orange juice or chocolate, especially dark chocolate, or MSG (monosodium glutamate). Try to avoid these headache triggers as much possible. It may be helpful to keep a headache diary to figure out what makes your headaches worse or brings them on and what alleviates them. Some people report headache onset after exercise but studies have shown that regular exercise may actually prevent headaches from coming. If you have exercise-induced headaches, please make sure that you drink plenty of fluid before and after exercising and that you do not over do it and do not overheat.  We can consider medications for migraine prevention in the future.  For now, for as needed use, I recommend Maxalt orally disintegrating tab, 5 mg: take 1 pill early on when you suspect a migraine attack come on. You may take another pill within 2 hours, no more than 2 pills in 24 hours. Most people who take triptans do not have any serious side-effects. However, they can cause drowsiness (remember to not drive or use heavy machinery when drowsy), nausea, dizziness, dry mouth. Less common side effects include strange sensations, such as tightness in your chest or throat, tingling, flushing, and feelings of heaviness or pressure in areas such as the face, limbs, and chest. These in the chest can mimic heart related pain (angina) and may cause alarm, but usually these sensations are not harmful or a sign of a heart attack. However, if you develop intense chest pain or sensations of discomfort, you should stop taking your medication and consult with me or your PCP or go to the nearest  urgent care facility or ER or call 911.   Based on your symptoms and your exam I believe you may be at risk for obstructive sleep apnea and would benefit from proceeding with a sleep study.   The risks and ramifications of moderate to severe obstructive sleep apnea or OSA are: Cardiovascular disease, including congestive heart failure, stroke, difficult to control hypertension, arrhythmias, and even type 2 diabetes has been linked to untreated OSA. Sleep apnea causes disruption of sleep and sleep deprivation in most cases, which, in turn, can cause recurrent headaches, problems with memory, mood, concentration, focus, and vigilance. Most people with untreated sleep apnea report excessive daytime sleepiness, which can affect their ability to drive. Please do not drive if you feel sleepy.  As discussed, if you have obstructive sleep apnea I will likely recommend a CPAP or AutoPap machine.    Our sleep lab administrative assistant will call you to schedule your sleep study. If you don't hear back from her by about 2 weeks from now, please feel free to call her at [phone]. You can leave a message with your phone number and concerns, if you get the voicemail box. She will call back as soon as possible.   We will do a blood test today to look for underlying myasthenia gravis.  Please follow-up with your primary care as scheduled, follow-up in this clinic in about 6 months.  We will schedule a sooner appointment depending on your test results.

## 2022-01-24 NOTE — Progress Notes (Signed)
Subjective:    Patient ID: Lance [name] is a 56 y.o. male.  HPI    Star Age, MD, PhD [facility] [address], [address] [place], [place] [address]  Dear Lance [name],   I saw your patient, Lance [name], upon your kind request in my neurologic clinic today for initial consultation of his recurrent headaches, concern for migraines.  The patient is unaccompanied today.  As you know, Lance [name] is a 56 year old right-handed gentleman with an underlying medical history of thyroid disease, including Graves' disease, history of kidney stones, neutropenia, history of pneumonia, and mildly overweight state, who reports approximately 2-week history of recurrent headaches.  Headaches were daily for about 6 days, they are typically in the occipital area and throbbing, typically no nausea or vomiting but did have 1 episode of nausea after his 6-day headache.  He has woken up with headaches.  He has pain behind the eyes.  He has a history of Graves' disease but no longer sees his endocrinologist.  I reviewed your office note from 01/05/2022.  He reported recurrent headaches and double vision.  He reported associated lightheadedness and nausea.  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  I had evaluated him in 2018 for dizziness.  I had ordered a brain MRI with and without contrast.  He did not have it done.    He did have a recent brain MRI with and without contrast, as ordered by Dr. Ardeth Perfect, on 01/05/2022 and I reviewed the results: IMPRESSION: 1. No acute intracranial abnormality. 2. Bilateral frontal predominant, multifocal hyperintense T2-weighted signal in the white matter, as may be seen in patients with migraines, but is also commonly present in asymptomatic patients.    He also had a brain MRI without contrast on 05/20/2014 and I reviewed the results: IMPRESSION: No acute  abnormality. Scattered small white matter hyperintensities are typical for age and may be related to chronic microvascular ischemia or migraine headache.   He had blood work through your office including BMP, TSH, free T4, and CBC.  He brought a copy of his test results, results were benign with the exception of slightly below normal TSH at 0.25.  His Epworth sleepiness score is 3 out of 24, fatigue severity score is 27 out of 63.  He has recently seen his ophthalmologist on 01/17/2022.  I received records from [facility] ophthalmology.  Eye exam was benign, corrected vision 20/20.  He did not have a dilated eye exam at the time but has had a recent dilated eye exam.  Double vision is intermittent, side to side, not frequent.  He has had scotomas, he has had infrequent migraine headaches for years, they are either rare or certainly not frequent enough to bother him but lately in the past few weeks he has taken Tylenol on almost a daily basis.  In the past few days he has taken it every other day or so.  He has had trouble falling asleep and staying asleep for years.  He has been on Ambien 10 mg at night for years.  He works in a hot environment, tries to hydrate well.  He does not drink caffeine daily, no alcohol currently, quit smoking in 2015.  Previously:   02/08/17: 56 year old right-handed gentleman with an underlying medical history of hypothyroidism, kidney stones, history of Graves' disease, history of neutropenia, prior history of pneumonia, who reports intermittent spells of dizziness for  the past 2 months or so. He feels a very brief sensation of lightheadedness, like he is going to pass out, which stops him in his tracks but he rides it out. Typically it is not with position change. [name] typically standing when this happens. He has not fallen but feels like he could pass out if he is not careful. When he first got diagnosed with Graves' disease in November 2017 he woke up with vertigo and  double vision. Since then, he has a fleeting sense of double vision sometimes but not nearly as bad, he has been checked out for his eyes and has prescription eyeglasses but has been told that otherwise he does not have any signs of Graves' disease having affected his eyes. He had palpitations and has tachycardia when he first got diagnosed with Graves' disease and was placed on Inderal at the time. He no longer takes it. He does not have a history of recurrent migraines but does have the occasional aching in the posterior head area, at the base of the skull, more on the left side. It helps to massage that area. He does not sleep very well. He has been on Ambien for years. He does not have a family history of Graves' disease. I reviewed your office note from 02/01/2017, which you kindly included. He had a brain MRI without contrast on 05/20/2014 which I reviewed: IMPRESSION: No acute abnormality. Scattered small white matter hyperintensities are typical for age and may be related to chronic microvascular ischemia or migraine headache.  His Past Medical History Is Significant For: Past Medical History:  Diagnosis Date   Graves disease    dx'd 01/2016   History of kidney stones    Hyperthyroidism    Hypothyroid    Leukopenia    Neutropenia (HCC)    Neutropenic fever ([place])    Pneumonia ~ 2015 X 1    His Past Surgical History Is Significant For: Past Surgical History:  Procedure Laterality Date   CLAVICLE SURGERY Left 1980s   has plates and pins   FRACTURE SURGERY     I & D EXTREMITY Right 11/04/2016   Procedure: IRRIGATION AND DEBRIDEMENT INDEX FINGER AND PINNING;  Surgeon: Iran Planas, MD;  Location: [place];  Service: Orthopedics;  Laterality: Right;   LITHOTRIPSY  1990s    His Family History Is Significant For: Family History  Problem Relation Age of Onset   Sleep apnea Mother    Bladder Cancer Father    Hypertension Father    Colon cancer Neg Hx    Esophageal cancer Neg Hx     Prostate cancer Neg Hx    Rectal cancer Neg Hx    Stomach cancer Neg Hx    Thyroid disease Neg Hx    Migraines Neg Hx    Headache Neg Hx     His Social History Is Significant For: Social History   Socioeconomic History   Marital status: Married    Spouse name: Not on file   Number of children: Not on file   Years of education: Not on file   Highest education level: Not on file  Occupational History   Not on file  Tobacco Use   Smoking status: Former    Packs/day: 0.50    Years: 20.00    Total pack years: 10.00    Types: Cigarettes    Quit date: 09/20/2014    Years since quitting: 7.3   Smokeless tobacco: Former    Types: Loss adjuster, chartered  Tobacco comments:    "stopped chewing the 1990s"  Substance and Sexual Activity   Alcohol use: No    Alcohol/week: 0.0 standard drinks of alcohol   Drug use: No   Sexual activity: Yes  Other Topics Concern   Not on file  Social History Narrative   Not on file   Social Determinants of Health   Financial Resource Strain: Not on file  Food Insecurity: Not on file  Transportation Needs: Not on file  Physical Activity: Not on file  Stress: Not on file  Social Connections: Not on file    His Allergies Are:  Allergies  Allergen Reactions   Methimazole     WBC drops   Methocarbamol     Other reaction(s): affected WBCs  :   His Current Medications Are:  Outpatient Encounter Medications as of 01/24/2022  Medication Sig   ALPRAZolam (XANAX) 0.25 MG tablet TAKE ONE TABLET BY MOUTH DAILY AS NEEDED FOR INSOMNIA (90 DAYS) Oral for 90 days   levothyroxine (SYNTHROID) 100 MCG tablet Take 100 mcg by mouth daily.   zolpidem (AMBIEN) 10 MG tablet Take 10 mg by mouth at bedtime as needed for sleep.   No facility-administered encounter medications on file as of 01/24/2022.  :   Review of Systems:  Out of a complete 14 point review of systems, all are reviewed and negative with the exception of these symptoms as listed below:   Review of Systems   Neurological:        Pt here for ocular migraines consult   Pt states last migraine was 4 days ago Pt states headaches daily , some fatigue , pt states some snoring  Pt states pain in both eyes and in back of head . Pt has seen eye doctor    Objective:  Neurological Exam  Physical Exam Physical Examination:   Vitals:   01/24/22 0812  BP: (!) 140/93  Pulse: 72    General Examination: The patient is a very pleasant 56 y.o. male in no acute distress. She appears well-developed and well-nourished and well groomed.   HEENT: Normocephalic, atraumatic, pupils are equal, [name] and reactive to light and accommodation. Corrective eye glasses in place.  No photophobia.  Funduscopic exam benign.  Slight proptosis, no ptosis.  Extraocular tracking is good without limitation to gaze excursion or nystagmus noted. Normal smooth pursuit is noted. Hearing is grossly intact to tuning fork. Face is symmetric with normal facial animation and normal facial sensation to light touch, temperature and vibration. Speech is clear with no dysarthria noted. There is no hypophonia. There is no lip, neck/head, jaw or voice tremor. Neck is supple with full range of passive and active motion. There are no carotid bruits on auscultation. Oropharynx exam reveals: Small airway entry, Mallampati class II, neck circumference 16-5/8 inches.  Mild overbite.  Tongue protrudes centrally and palate elevates symmetrically, tonsils on the small side, left side easier to see than right, slightly longer uvula.     Chest: Clear to auscultation without wheezing, rhonchi or crackles noted.   Heart: S1+S2+0, regular and normal without murmurs, rubs or gallops noted.    Abdomen: Soft, non-tender and non-distended.   Extremities: There is no pitting edema in the distal lower extremities bilaterally.    Skin: Warm and dry without trophic changes noted.   Musculoskeletal: exam reveals no obvious joint deformities.    Neurologically:   Mental status: The patient is awake, alert and oriented in all 4 spheres. His immediate and remote  memory, attention, language skills and fund of knowledge are appropriate. There is no evidence of aphasia, agnosia, apraxia or anomia. Speech is clear with normal prosody and enunciation. Thought process is linear. Mood is normal and affect is normal.  Cranial nerves II - XII are as described above under HEENT exam. In addition: shoulder shrug is normal with equal shoulder height noted. Motor exam: Normal bulk, strength and tone is noted. There is no drift, tremor or rebound. Romberg is negative. Reflexes are 2+ throughout. Babinski: Toes are flexor bilaterally. Fine motor skills and coordination: intact with normal finger taps, normal hand movements, normal rapid alternating patting, normal foot taps and normal foot agility.  Cerebellar testing: No dysmetria or intention tremor on finger to nose testing. Heel to shin is unremarkable bilaterally.  Normal finger-to-nose bilaterally.  There is no truncal or gait ataxia.  Sensory exam: intact to light touch, vibration, temperature sense in the upper and lower extremities.  Gait, station and balance: He stands easily. No veering to one side is noted. No leaning to one side is noted. Posture is age-appropriate and stance is narrow based. Gait shows normal stride length and normal pace. No problems turning are noted. Tandem walk is unremarkable.             Assessment and Plan:   In summary, Teodoro E Loiseau is a very pleasant 56 y.o.-year old male with an underlying medical history of thyroid disease, including Graves' disease, history of kidney stones, neutropenia, history of pneumonia, and mildly overweight state, who presents for evaluation of his recurrent headaches.  He has had visual, not always with headaches, in keeping with ocular migraines but he has had migraine headaches as well.  He has had infrequent migraines for years.  He had a recent longer  migraine lasting for several days, followed by nausea.  Triggers may include sleep deprivation, dehydration, overheating.  He does report working in a hot environment at work.  He has been on Ambien for years.  He has trouble falling asleep and staying asleep.  Neurological exam is nonfocal.  [name] encouraged to talk to you about seeing his endocrinologist again.  TSH was slightly abnormal recently.  He had a benign eye exam with his ophthalmologist recently.  [name] advised to proceed with blood work for myasthenia panel just to be sure and with a sleep study to investigate the possibility of underlying obstructive sleep apnea.  If he has obstructive sleep apnea I will likely recommend a CPAP or AutoPap machine.  He would be willing to consider this.  Insurance does not cover a laboratory attended sleep study, we will proceed with a home sleep test.  I explained this to him and he is in agreement.  For acute use for migraine headaches, I suggested Maxalt orally disintegrating tablet 5 mg strength as needed.  We may consider migraine preventative in the future if the need arises but for now, I do not think he needs to be on daily migraine prevention.  We will follow-up after testing, we will keep him posted as to his test results by phone call.  For now, we will plan to follow-up in about 6 months but we may move up the appointment according to the test results.  I answered all his questions today and he was in agreement with our plan.  This was an extended visit of over 1 hour.   Thank you very much for allowing me to participate in the care of this nice  patient. If I can be of any further assistance to you please do not hesitate to call me at [phone].  Sincerely,   Star Age, MD, PhD

## 2022-01-30 ENCOUNTER — Telehealth: Payer: Self-pay | Admitting: *Deleted

## 2022-01-30 NOTE — Telephone Encounter (Signed)
-----   Message from Star Age, MD sent at 01/30/2022  1:42 PM EDT ----- Blood test for myasthenia gravis was negative.  Please inform patient.

## 2022-01-30 NOTE — Telephone Encounter (Signed)
Spoke to patient gave lab results . Pt expressed understanding and thanked me for calling

## 2022-01-31 DIAGNOSIS — E039 Hypothyroidism, unspecified: Secondary | ICD-10-CM | POA: Diagnosis not present

## 2022-01-31 DIAGNOSIS — Z125 Encounter for screening for malignant neoplasm of prostate: Secondary | ICD-10-CM | POA: Diagnosis not present

## 2022-02-07 DIAGNOSIS — Z1339 Encounter for screening examination for other mental health and behavioral disorders: Secondary | ICD-10-CM | POA: Diagnosis not present

## 2022-02-07 DIAGNOSIS — Z0000 Encounter for general adult medical examination without abnormal findings: Secondary | ICD-10-CM | POA: Diagnosis not present

## 2022-02-07 DIAGNOSIS — Z1331 Encounter for screening for depression: Secondary | ICD-10-CM | POA: Diagnosis not present

## 2022-02-07 DIAGNOSIS — R7989 Other specified abnormal findings of blood chemistry: Secondary | ICD-10-CM | POA: Diagnosis not present

## 2022-02-07 DIAGNOSIS — E039 Hypothyroidism, unspecified: Secondary | ICD-10-CM | POA: Diagnosis not present

## 2022-02-10 LAB — MUSK ANTIBODIES: MuSK Antibodies: <1 U/mL

## 2022-02-10 LAB — MYASTHENIA GRAVIS PROFILE
AChR Binding Ab, Serum: <0.03 nmol/L (ref 0.00–0.24)
AChR-modulating Ab: 7 % (ref 0–45)
Acetylchol Block Ab: 19 % (ref 0–25)
Anti-striation Abs: NEGATIVE

## 2022-04-03 DIAGNOSIS — M545 Low back pain, unspecified: Secondary | ICD-10-CM | POA: Diagnosis not present

## 2022-05-17 DIAGNOSIS — R197 Diarrhea, unspecified: Secondary | ICD-10-CM | POA: Diagnosis not present

## 2022-05-17 DIAGNOSIS — U071 COVID-19: Secondary | ICD-10-CM | POA: Diagnosis not present

## 2022-05-17 DIAGNOSIS — R5081 Fever presenting with conditions classified elsewhere: Secondary | ICD-10-CM | POA: Diagnosis not present

## 2022-06-24 DIAGNOSIS — J019 Acute sinusitis, unspecified: Secondary | ICD-10-CM | POA: Diagnosis not present

## 2022-07-25 ENCOUNTER — Other Ambulatory Visit: Payer: Self-pay

## 2022-07-25 DIAGNOSIS — I83893 Varicose veins of bilateral lower extremities with other complications: Secondary | ICD-10-CM

## 2022-08-09 NOTE — Progress Notes (Unsigned)
[facility]           OF [place]  History and Physical   Lance [name] is a 57 y.o. male who presents with LLE varicose veins.  He states that recently, he felt like one of the veins burst and it caused bruising around the veins.  This has since resolved.  He denies any hx of DVT.  He has not had any vein procedures.  He does not have skin color changes.  He wears compression occasionally. There is no family hx of varicose veins.  He does not really have any swelling or pain or achiness.   His medical hx is significant for Graves Disease.   The pt is not on a statin for cholesterol management.  The pt is not on a daily aspirin.   Other AC:  none The pt is not on medication for hypertension.   The pt is not diabetic.   Tobacco hx:  former  Pt does not have family hx of AAA.  Past Medical History:  Diagnosis Date   Graves disease    dx'd 01/2016   History of kidney stones    Hyperthyroidism    Hypothyroid    Leukopenia    Neutropenia (HCC)    Neutropenic fever ([place])    Pneumonia ~ 2015 X 1    Past Surgical History:  Procedure Laterality Date   CLAVICLE SURGERY Left 1980s   has plates and pins   FRACTURE SURGERY     I & D EXTREMITY Right 11/04/2016   Procedure: IRRIGATION AND DEBRIDEMENT INDEX FINGER AND PINNING;  Surgeon: Iran Planas, MD;  Location: [facility];  Service: Orthopedics;  Laterality: Right;   LITHOTRIPSY  1990s    Social History   Socioeconomic History   Marital status: Married    Spouse name: Not on file   Number of children: Not on file   Years of education: Not on file   Highest education level: Not on file  Occupational History   Not on file  Tobacco Use   Smoking status: Former    Packs/day: 0.50    Years: 20.00    Additional pack years: 0.00    Total pack years: 10.00    Types: Cigarettes    Quit date: 09/20/2014    Years since quitting: 7.8   Smokeless tobacco: Former    Types: Chew   Tobacco comments:     "stopped chewing the 1990s"  Substance and Sexual Activity   Alcohol use: No    Alcohol/week: 0.0 standard drinks of alcohol   Drug use: No   Sexual activity: Yes  Other Topics Concern   Not on file  Social History Narrative   Not on file   Social Determinants of Health   Financial Resource Strain: Not on file  Food Insecurity: Not on file  Transportation Needs: Not on file  Physical Activity: Not on file  Stress: Not on file  Social Connections: Not on file  Intimate Partner Violence: Not on file     Family History  Problem Relation Age of Onset   Sleep apnea Mother    Bladder Cancer Father    Hypertension Father    Colon cancer Neg Hx    Esophageal cancer Neg Hx    Prostate cancer Neg Hx    Rectal cancer Neg Hx    Stomach cancer Neg Hx    Thyroid disease Neg Hx    Migraines Neg  Hx    Headache Neg Hx     Current Outpatient Medications  Medication Sig Dispense Refill   ALPRAZolam (XANAX) 0.25 MG tablet TAKE ONE TABLET BY MOUTH DAILY AS NEEDED FOR INSOMNIA (90 DAYS) Oral for 90 days     levothyroxine (SYNTHROID) 100 MCG tablet Take 100 mcg by mouth daily.     rizatriptan (MAXALT-MLT) 5 MG disintegrating tablet Take 1 tablet (5 mg total) by mouth as needed for migraine. May repeat in 2 h prn, no more than 2 pills/24 h, no more than 3 pills/week. 10 tablet 1   zolpidem (AMBIEN) 10 MG tablet Take 10 mg by mouth at bedtime as needed for sleep.     No current facility-administered medications for this visit.    Allergies  Allergen Reactions   Methimazole     WBC drops   Methocarbamol     Other reaction(s): affected WBCs    REVIEW OF SYSTEMS:   [X]  denotes positive finding, [ ]  denotes negative finding Cardiac  Comments:  Chest pain or chest pressure:    Shortness of breath upon exertion:    Short of breath when lying flat:    Irregular heart rhythm:        Vascular    Pain in calf, thigh, or hip brought on by ambulation:    Pain in feet at night that wakes  you up from your sleep:     Blood clot in your veins:    Leg swelling:  x       Pulmonary    Oxygen at home:    Productive cough:     Wheezing:         Neurologic    Sudden weakness in arms or legs:     Sudden numbness in arms or legs:     Sudden onset of difficulty speaking or slurred speech:    Temporary loss of vision in one eye:     Problems with dizziness:         Gastrointestinal    Blood in stool:     Vomited blood:         Genitourinary    Burning when urinating:     Blood in urine:        Psychiatric    Major depression:         Hematologic    Bleeding problems:    Problems with blood clotting too easily:        Skin    Rashes or ulcers:        Constitutional    Fever or chills:      PHYSICAL EXAMINATION:  Today's Vitals   08/10/22 1435  BP: 123/86  Pulse: 90  Resp: 20  Temp: 98 F (36.7 C)  TempSrc: Temporal  SpO2: 97%  Weight: 165 lb 4.8 oz (75 kg)  Height: 5\' 8"  (1.727 m)  PainSc: 5    Body mass index is 25.13 kg/m.   General:  WDWN in NAD; vital signs documented above Gait: Not observed HENT: WNL, normocephalic Pulmonary: normal non-labored breathing without wheezing Cardiac: regular HR; without carotid bruits Abdomen: soft, NT, aortic pulse is not palpable Skin: without rashes Vascular Exam/Pulses:  Right Left  Radial 2+ (normal) 2+ (normal)  DP 2+ (normal) 2+ (normal)  PT 2+ (normal) 2+ (normal)   Extremities: large varicosities left lower leg; minimal swelling Neurologic: A&O X 3;  moving all extremities equally Psychiatric:  The pt has Normal affect.   Non-Invasive Vascular Imaging:  Venous duplex on 08/10/2022: +--------------+---------+------+-----------+------------+--------+  LEFT         Reflux NoRefluxReflux TimeDiameter cmsComments                          Yes                                   +--------------+---------+------+-----------+------------+--------+  CFV          no                                               +--------------+---------+------+-----------+------------+--------+  FV prox       no                                              +--------------+---------+------+-----------+------------+--------+  FV mid        no                                              +--------------+---------+------+-----------+------------+--------+  FV dist       no                                              +--------------+---------+------+-----------+------------+--------+  Popliteal    no                                              +--------------+---------+------+-----------+------------+--------+  GSV at [facility]    no                            1.28              +--------------+---------+------+-----------+------------+--------+  GSV prox thigh          yes    >500 ms      1.02              +--------------+---------+------+-----------+------------+--------+  GSV mid thigh           yes    >500 ms     0.698              +--------------+---------+------+-----------+------------+--------+  GSV dist thigh          yes    >500 ms      .773              +--------------+---------+------+-----------+------------+--------+  GSV at knee             yes    >500 ms      1.90              +--------------+---------+------+-----------+------------+--------+  GSV prox calf           yes    >500 ms     0.664              +--------------+---------+------+-----------+------------+--------+  GSV mid calf            yes    >500 ms     0.669              +--------------+---------+------+-----------+------------+--------+  GSV dist calf           yes    >500 ms     0.515              +--------------+---------+------+-----------+------------+--------+  SSV Pop Fossa no                           0.361              +--------------+---------+------+-----------+------------+--------+  SSV mid calf  no                            0.306              +--------------+---------+------+-----------+------------+--------+   Summary:  Left:  - No evidence of deep vein thrombosis seen in the left lower extremity, from the common femoral through the popliteal veins.  - No evidence of superficial venous thrombosis in the left lower extremity.  - Venous reflux is noted in the left greater saphenous vein in the thigh.  - Venous reflux is noted in the left greater saphenous vein in the calf.     Amarie E Khawaja is a 57 y.o. male who presents with: varicose veins left leg    -pt has easily palpable pedal pulses bilaterally -pt does not have evidence of DVT.  Pt does have venous reflux in the GSV in the thigh and calf, however, he does not have reflux at the [facility] or in the deep venous system.  -discussed with pt about wearing knee high 15-20 mmHg compression stockings -discussed the importance of leg elevation and how to elevate properly - pt is advised to elevate their legs and a diagram is given to them to demonstrate for pt to lay flat on their back with knees elevated and slightly bent with their feet higher than their knees, which puts their feet higher than their heart for 15 minutes per day.  If pt cannot lay flat, advised to lay as flat as possible.  -pt is advised to continue as much walking as possible and avoid sitting or standing for long periods of time.  -discussed  that water aerobics would also be beneficial.  -handout with recommendations given -pt will f/u as needed   Leontine Locket, [facility] [phone]  Clinic MD:  Scot Dock

## 2022-08-10 ENCOUNTER — Ambulatory Visit (INDEPENDENT_AMBULATORY_CARE_PROVIDER_SITE_OTHER): Payer: BC Managed Care – PPO | Admitting: Physician Assistant

## 2022-08-10 ENCOUNTER — Ambulatory Visit (HOSPITAL_COMMUNITY)
Admission: RE | Admit: 2022-08-10 | Discharge: 2022-08-10 | Disposition: A | Discharge status: Home / Self Care | Payer: BC Managed Care – PPO | Source: Ambulatory Visit | Attending: Vascular Surgery | Admitting: Vascular Surgery

## 2022-08-10 VITALS — BP 123/86 | HR 90 | Temp 98.0°F | Resp 20 | Ht 68.0 in | Wt 165.3 lb

## 2022-08-10 DIAGNOSIS — I83893 Varicose veins of bilateral lower extremities with other complications: Secondary | ICD-10-CM

## 2023-02-09 DIAGNOSIS — Z125 Encounter for screening for malignant neoplasm of prostate: Secondary | ICD-10-CM | POA: Diagnosis not present

## 2023-02-09 DIAGNOSIS — M545 Low back pain, unspecified: Secondary | ICD-10-CM | POA: Diagnosis not present

## 2023-02-09 DIAGNOSIS — E039 Hypothyroidism, unspecified: Secondary | ICD-10-CM | POA: Diagnosis not present

## 2023-02-16 DIAGNOSIS — Z0000 Encounter for general adult medical examination without abnormal findings: Secondary | ICD-10-CM | POA: Diagnosis not present

## 2023-02-16 DIAGNOSIS — Z23 Encounter for immunization: Secondary | ICD-10-CM | POA: Diagnosis not present

## 2023-02-16 DIAGNOSIS — E039 Hypothyroidism, unspecified: Secondary | ICD-10-CM | POA: Diagnosis not present

## 2023-02-16 DIAGNOSIS — Z1339 Encounter for screening examination for other mental health and behavioral disorders: Secondary | ICD-10-CM | POA: Diagnosis not present

## 2023-02-16 DIAGNOSIS — Z1331 Encounter for screening for depression: Secondary | ICD-10-CM | POA: Diagnosis not present

## 2023-03-29 DIAGNOSIS — J018 Other acute sinusitis: Secondary | ICD-10-CM | POA: Diagnosis not present

## 2023-03-29 DIAGNOSIS — J029 Acute pharyngitis, unspecified: Secondary | ICD-10-CM | POA: Diagnosis not present

## 2023-04-04 DIAGNOSIS — H9313 Tinnitus, bilateral: Secondary | ICD-10-CM | POA: Diagnosis not present

## 2023-04-04 DIAGNOSIS — H903 Sensorineural hearing loss, bilateral: Secondary | ICD-10-CM | POA: Diagnosis not present

## 2023-06-20 DIAGNOSIS — M5416 Radiculopathy, lumbar region: Secondary | ICD-10-CM | POA: Diagnosis not present

## 2023-07-01 DIAGNOSIS — M5416 Radiculopathy, lumbar region: Secondary | ICD-10-CM | POA: Diagnosis not present

## 2023-07-01 DIAGNOSIS — M5459 Other low back pain: Secondary | ICD-10-CM | POA: Diagnosis not present

## 2023-07-11 DIAGNOSIS — J069 Acute upper respiratory infection, unspecified: Secondary | ICD-10-CM | POA: Diagnosis not present

## 2023-07-16 DIAGNOSIS — M5459 Other low back pain: Secondary | ICD-10-CM | POA: Diagnosis not present

## 2023-08-03 DIAGNOSIS — M5136 Other intervertebral disc degeneration, lumbar region with discogenic back pain only: Secondary | ICD-10-CM | POA: Diagnosis not present

## 2023-11-19 ENCOUNTER — Emergency Department (HOSPITAL_BASED_OUTPATIENT_CLINIC_OR_DEPARTMENT_OTHER)
Admission: EM | Admit: 2023-11-19 | Discharge: 2023-11-19 | Disposition: A | Discharge status: Home / Self Care | Attending: Emergency Medicine | Admitting: Emergency Medicine

## 2023-11-19 ENCOUNTER — Other Ambulatory Visit: Payer: Self-pay

## 2023-11-19 ENCOUNTER — Encounter (HOSPITAL_BASED_OUTPATIENT_CLINIC_OR_DEPARTMENT_OTHER): Payer: Self-pay | Admitting: Emergency Medicine

## 2023-11-19 DIAGNOSIS — M5441 Lumbago with sciatica, right side: Secondary | ICD-10-CM | POA: Diagnosis not present

## 2023-11-19 DIAGNOSIS — M545 Low back pain, unspecified: Secondary | ICD-10-CM | POA: Insufficient documentation

## 2023-11-19 MED ORDER — IBUPROFEN 800 MG PO TABS: 800.0000 mg | ORAL_TABLET | Freq: Three times a day (TID) | ORAL | 0 refills | Status: DC

## 2023-11-19 MED ORDER — OXYCODONE-ACETAMINOPHEN 5-325 MG PO TABS: 1.0000 | ORAL_TABLET | Freq: Four times a day (QID) | ORAL | 0 refills | Status: DC | PRN

## 2023-11-19 MED ORDER — CYCLOBENZAPRINE HCL 10 MG PO TABS: 10.0000 mg | ORAL_TABLET | Freq: Two times a day (BID) | ORAL | 0 refills | Status: DC | PRN

## 2023-11-19 NOTE — ED Provider Notes (Signed)
 [place] EMERGENCY DEPARTMENT AT [facility] Provider Note   CSN: [id number] Arrival date & time: 11/19/23  0745     Patient presents with: Back Pain   Lance [name] is a 58 y.o. male.   HPI 58 year old male presents today complaining of low back pain.  He reports this began in mid April.  There was no trauma, fever, history of infections or prior significant low back pain.  He describes it as sharp in the low back area with some radiation to the right buttock.  He uses over-the-counter pain medicine.  He was then referred for follow-up to an orthopedist.  He was seen in the office of an orthopedist in [place] in late May to June.  He states that they told him there could be a disc there but he was to have physical therapy first.  He went to physical therapy without relief.  He was placed on muscle relaxants without relief.  He reports that he had to go out of town for training and then had to do something for the past week.  The pain has continued to worsen.  He has not had loss of bowel or bladder control, weakness, numbness or tingling.  He does report that his back and right leg have given out a couple of times.  He denies any IV drug use, fever, chills    Prior to Admission medications   Medication Sig Start Date End Date Taking? Authorizing Provider  ALPRAZolam (XANAX) 0.25 MG tablet TAKE ONE TABLET BY MOUTH DAILY AS NEEDED FOR INSOMNIA (90 DAYS) Oral for 90 days 01/31/21   [provider]  levothyroxine (SYNTHROID) 100 MCG tablet Take 100 mcg by mouth daily. 07/28/21   [provider]  rizatriptan  (MAXALT -MLT) 5 MG disintegrating tablet Take 1 tablet (5 mg total) by mouth as needed for migraine. May repeat in 2 h prn, no more than 2 pills/24 h, no more than 3 pills/week. Patient not taking: Reported on 08/10/2022 01/24/22   Athar, Saima, MD  zolpidem  (AMBIEN ) 10 MG tablet Take 10 mg by mouth at bedtime as needed for sleep.    [provider]     Allergies: Methimazole  and Methocarbamol    Review of Systems  Updated Vital Signs BP (!) 151/92 (BP Location: Right Arm)   Pulse 70   Temp 97.6 F (36.4 C) (Oral)   Resp 16   SpO2 100%   Physical Exam Vitals and nursing note reviewed.  HENT:     Head: Normocephalic.     Right Ear: External ear normal.     Left Ear: External ear normal.     Nose: Nose normal.     Mouth/Throat:     Pharynx: Oropharynx is clear.   Eyes:     Extraocular Movements: Extraocular movements intact.     Pupils: Pupils are equal, round, and reactive to light.    Cardiovascular:     Rate and Rhythm: Normal rate.  Pulmonary:     Effort: Pulmonary effort is normal.   Musculoskeletal:        General: Normal range of motion.     Cervical back: Normal range of motion.     Comments: Back visually examined and no obvious external deformities, redness, or swelling There is some tenderness diffusely on the low back area.   Skin:    General: Skin is warm and dry.     Capillary Refill: Capillary refill takes less than 2 seconds.   Neurological:  General: No focal deficit present.     Mental Status: [name] alert.     Sensory: No sensory deficit.     Motor: No weakness.     Coordination: Coordination normal.     Gait: Gait normal.   Psychiatric:        Mood and Affect: Mood normal.     (all labs ordered are listed, but only abnormal results are displayed) Labs Reviewed - No data to display  EKG: None  Radiology: No results found.   Procedures   Medications Ordered in the ED - No data to display                                  Medical Decision Making  Atraumatic low back pain otherwise healthy 58 year old male.  He has been seen by orthopedics and had plain x-rays in the past month.  He reports no direct trauma, fever, or other red flags.  Patient with normal exam except for some tenderness of the low back area. He has been seen by orthopedics but is not sure who he  saw. Plan continue skeletal muscle relaxants, short dose of Percocet, and anti-inflammatory medicine Patient advised regarding return precautions for any numbness, tingling, weakness, loss of bowel or bladder control or other new symptoms No repeat imaging as patient has recently had plain films and no interim trauma. Patient advised regarding follow-up for potential MRI if symptoms continue despite treatment    Final diagnoses:  None    ED Discharge Orders     None          Levander Houston, MD 11/19/23 [id number]

## 2023-11-19 NOTE — Discharge Instructions (Signed)
 Return immediately to the emergency department if you are having new weakness, loss of bowel or bladder control or other new symptoms

## 2023-11-19 NOTE — ED Triage Notes (Signed)
 C/o lower back pain since April. Denies injury. Denies urinary symptoms.

## 2023-11-20 DIAGNOSIS — M545 Low back pain, unspecified: Secondary | ICD-10-CM | POA: Diagnosis not present

## 2023-11-20 DIAGNOSIS — S32050A Wedge compression fracture of fifth lumbar vertebra, initial encounter for closed fracture: Secondary | ICD-10-CM | POA: Diagnosis not present

## 2023-11-20 DIAGNOSIS — M5416 Radiculopathy, lumbar region: Secondary | ICD-10-CM | POA: Diagnosis not present

## 2023-11-21 ENCOUNTER — Encounter: Payer: Self-pay | Admitting: *Deleted

## 2023-11-21 ENCOUNTER — Inpatient Hospital Stay: Admitting: Hematology & Oncology

## 2023-11-21 ENCOUNTER — Encounter: Payer: Self-pay | Admitting: Hematology & Oncology

## 2023-11-21 ENCOUNTER — Inpatient Hospital Stay: Attending: Hematology & Oncology

## 2023-11-21 VITALS — BP 128/63 | HR 70 | Temp 97.7°F | Resp 18 | Ht 68.0 in | Wt 164.0 lb

## 2023-11-21 DIAGNOSIS — C7951 Secondary malignant neoplasm of bone: Secondary | ICD-10-CM | POA: Diagnosis not present

## 2023-11-21 DIAGNOSIS — Z87891 Personal history of nicotine dependence: Secondary | ICD-10-CM | POA: Insufficient documentation

## 2023-11-21 DIAGNOSIS — M899 Disorder of bone, unspecified: Secondary | ICD-10-CM

## 2023-11-21 DIAGNOSIS — C801 Malignant (primary) neoplasm, unspecified: Secondary | ICD-10-CM | POA: Insufficient documentation

## 2023-11-21 DIAGNOSIS — D704 Cyclic neutropenia: Secondary | ICD-10-CM

## 2023-11-21 DIAGNOSIS — D709 Neutropenia, unspecified: Secondary | ICD-10-CM

## 2023-11-21 LAB — CBC WITH DIFFERENTIAL (CANCER CENTER ONLY)
Abs Immature Granulocytes: 0.08 K/uL — ABNORMAL HIGH (ref 0.00–0.07)
Basophils Absolute: 0 K/uL (ref 0.0–0.1)
Basophils Relative: 0 %
Eosinophils Absolute: 0 K/uL (ref 0.0–0.5)
Eosinophils Relative: 0 %
HCT: 42.9 % (ref 39.0–52.0)
Hemoglobin: 14.4 g/dL (ref 13.0–17.0)
Immature Granulocytes: 1 %
Lymphocytes Relative: 16 %
Lymphs Abs: 1.7 K/uL (ref 0.7–4.0)
MCH: 30.7 pg (ref 26.0–34.0)
MCHC: 33.6 g/dL (ref 30.0–36.0)
MCV: 91.5 fL (ref 80.0–100.0)
Monocytes Absolute: 0.7 K/uL (ref 0.1–1.0)
Monocytes Relative: 6 %
Neutro Abs: 8.4 K/uL — ABNORMAL HIGH (ref 1.7–7.7)
Neutrophils Relative %: 77 %
Platelet Count: 329 K/uL (ref 150–400)
RBC: 4.69 MIL/uL (ref 4.22–5.81)
RDW: 13.7 % (ref 11.5–15.5)
WBC Count: 10.8 K/uL — ABNORMAL HIGH (ref 4.0–10.5)
nRBC: 0 % (ref 0.0–0.2)

## 2023-11-21 LAB — CMP (CANCER CENTER ONLY)
ALT: 64 U/L — ABNORMAL HIGH (ref 0–44)
AST: 25 U/L (ref 15–41)
Albumin: 4.5 g/dL (ref 3.5–5.0)
Alkaline Phosphatase: 84 U/L (ref 38–126)
Anion gap: 7 (ref 5–15)
BUN: 19 mg/dL (ref 6–20)
CO2: 28 mmol/L (ref 22–32)
Calcium: 9.8 mg/dL (ref 8.9–10.3)
Chloride: 106 mmol/L (ref 98–111)
Creatinine: 0.79 mg/dL (ref 0.61–1.24)
GFR, Estimated: >60 mL/min
Glucose, Bld: 102 mg/dL — ABNORMAL HIGH (ref 70–99)
Potassium: 4.4 mmol/L (ref 3.5–5.1)
Sodium: 141 mmol/L (ref 135–145)
Total Bilirubin: 0.8 mg/dL (ref 0.0–1.2)
Total Protein: 6.9 g/dL (ref 6.5–8.1)

## 2023-11-21 LAB — LACTATE DEHYDROGENASE: LDH: 143 U/L (ref 98–192)

## 2023-11-21 LAB — SAVE SMEAR(SSMR), FOR PROVIDER SLIDE REVIEW

## 2023-11-21 MED ORDER — TRAMADOL HCL 50 MG PO TABS
50.0000 mg | ORAL_TABLET | ORAL | 0 refills | Status: DC | PRN
Start: 2023-11-21 — End: 2023-12-12

## 2023-11-21 NOTE — Progress Notes (Signed)
 Reached out to [employer] to introduce myself as the office RN Navigator and explain our new patient process. Reviewed the reason for their referral and scheduled their new patient appointment along with labs. Provided address and directions to the office including call back phone number. Reviewed with patient any concerns they may have or any possible barriers to attending their appointment.   Informed patient about my role as a navigator and that I will meet with them prior to their New Patient appointment and more fully discuss what services I can provide. At this time patient has no further questions or needs.    Called [facility] to request MRI report. Message left requesting report prior to today's 2pm appointment.   Oncology Nurse Navigator Documentation     11/21/2023    9:15 AM  Oncology Nurse Navigator Flowsheets  Abnormal Finding Date 11/20/2023  Navigator Follow Up Date: 11/21/2023  Navigator Follow Up Reason: New Patient Appointment  Navigator Location [facility]  Referral Date to RadOnc/MedOnc 11/21/2023  Navigator Encounter Type Introductory Phone Call  Treatment Phase Abnormal Scans  Barriers/Navigation Needs Coordination of Care;Education  Education Other  Interventions Coordination of Care;Education  Acuity Level 2-Minimal Needs (1-2 Barriers Identified)  Coordination of Care Appts  Education Method Verbal;Teach-back  Time Spent with Patient 30

## 2023-11-21 NOTE — Progress Notes (Signed)
 Initial RN Navigator Patient Visit  Name: Lance [name] Date of Referral : 11/21/2023 Diagnosis: Spinal Lesions  Met with patient prior to their visit with MD. [name] patient Your Patient Navigator handout which explains my role, areas in which I am able to help, and all the contact information for myself and the office. Also gave patient MD and Navigator business card. Reviewed with patient the general overview of expected course after initial diagnosis and time frame for all steps to be completed.  Patient comes in with his wife. He works, but at this time is on leave for the month of July. She works in an opthalmologic office but has flexibility with her work. They ask that everything be scheduled ASAP. Patient and his wife are both overwhelmed with this recent news and as of right now haven't shared with many. [name] eager to get some more answers.   Patient and wife know that once Dr Timmy dictates office note and places orders I will begin to get things scheduled. I will call and speak to them tomorrow to update on any progress.   Patient completed visit with Dr. Timmy.   Patient understands all follow up procedures and expectations. They have my number to reach out for any further clarification or additional needs.   Oncology Nurse Navigator Documentation     11/21/2023    2:00 PM  Oncology Nurse Navigator Flowsheets  Navigator Follow Up Date: 11/22/2023  Navigator Follow Up Reason: Appointment Review  Navigator Location [facility]  Navigator Encounter Type Initial MedOnc  Treatment Phase Abnormal Scans  Barriers/Navigation Needs Coordination of Care;Education  Education Newly Diagnosed Cancer Education;Pain/ Symptom Management;Preparing for Upcoming Surgery/ Treatment  Interventions Education;Psycho-Social Support  Acuity Level 2-Minimal Needs (1-2 Barriers Identified)  Education Method Verbal;Written  Support Groups/Services Friends and Family  Time Spent with Patient 30

## 2023-11-21 NOTE — Progress Notes (Signed)
 Referral MD  Reason for Referral: Pathologic fracture T5  Chief Complaint  Patient presents with   New Patient (Initial Visit)  : My back has been getting worse.  HPI: Lance [name] is a very nice 58 year old white male.  He has been very good health.  He has a history of Graves' disease.  He apparently developed severe neutropenia when placed on methimazole .  This was back in 2017.  He has been working.  He works in a [employer].  He has been having worsening lower back discomfort.  At first, he thought this was just from work.  It started in April.  Is gotten progressively worse.  He was told that he likely had a herniated disc.  He was seen by different orthopedist.  He ultimately went to Dr. Irving at [facility].  Dr. Irving was astute enough to do an MRI.  Surprisingly, the MRI showed that there was replacement of marrow at L4 and L5.  He had pathologic fracture of 50% and L5.  He has small lesions at T12 and L1.  He has had no problems with bowels or bladder.  He has had no leg weakness.  He has had no pain in the legs.  He says there may be a little bit of pain in the right hip.  He has had no cough or shortness of breath.  He did smoke.  He stopped in 2015.  He probably smoked 1 pack a day.  He says there is a family history of pancreatic cancer with his mother and bladder cancer with his father.  He has had no bleeding.  He has had some weight loss.  He started to gain a little weight back.  He has had no headache.  He has had no rashes.  He has had no swollen lymph nodes.  He has had no sweats.  There is been no fever.  Overall, I would have said that his performance status is probably ECOG 1.    Past Medical History:  Diagnosis Date   Graves disease    dx'd 01/2016   History of kidney stones    Hyperthyroidism    Hypothyroid    Leukopenia    Neutropenia (HCC)    Neutropenic fever (HCC)    Pneumonia ~ 2015 X 1  :   Past Surgical History:   Procedure Laterality Date   CLAVICLE SURGERY Left 1980s   has plates and pins   FRACTURE SURGERY     I & D EXTREMITY Right 11/04/2016   Procedure: IRRIGATION AND DEBRIDEMENT INDEX FINGER AND PINNING;  Surgeon: Shari Easter, MD;  Location: [facility] OR;  Service: Orthopedics;  Laterality: Right;   LITHOTRIPSY  1990s  :   Current Outpatient Medications:    ALPRAZolam (XANAX) 0.25 MG tablet, TAKE ONE TABLET BY MOUTH DAILY AS NEEDED FOR INSOMNIA (90 DAYS) Oral for 90 days, Disp: , Rfl:    ALPRAZolam (XANAX) 0.5 MG tablet, Take 0.5 mg by mouth daily as needed for anxiety., Disp: , Rfl:    cyclobenzaprine (FLEXERIL) 10 MG tablet, Take 1 tablet (10 mg total) by mouth 2 (two) times daily as needed for muscle spasms., Disp: 20 tablet, Rfl: 0   ibuprofen (ADVIL) 800 MG tablet, Take 1 tablet (800 mg total) by mouth 3 (three) times daily., Disp: 21 tablet, Rfl: 0   levothyroxine (SYNTHROID) 100 MCG tablet, Take 100 mcg by mouth daily., Disp: , Rfl:    oxyCODONE -acetaminophen  (PERCOCET/ROXICET) 5-325 MG tablet, Take 1 tablet  by mouth every 6 (six) hours as needed for severe pain (pain score 7-10)., Disp: 6 tablet, Rfl: 0   traMADol (ULTRAM) 50 MG tablet, Take 50 mg by mouth every 4 (four) hours as needed., Disp: , Rfl:    zolpidem  (AMBIEN ) 10 MG tablet, Take 10 mg by mouth at bedtime as needed for sleep., Disp: , Rfl:    rizatriptan  (MAXALT -MLT) 5 MG disintegrating tablet, Take 1 tablet (5 mg total) by mouth as needed for migraine. May repeat in 2 h prn, no more than 2 pills/24 h, no more than 3 pills/week. (Patient not taking: Reported on 11/21/2023), Disp: 10 tablet, Rfl: 1:  :   Allergies  Allergen Reactions   Methimazole  Other (See Comments)    WBC drops   Methocarbamol Other (See Comments)    WBC drops  :   Family History  Problem Relation Age of Onset   Sleep apnea Mother    Bladder Cancer Father    Hypertension Father    Colon cancer Neg Hx    Esophageal cancer Neg Hx    Prostate cancer  Neg Hx    Rectal cancer Neg Hx    Stomach cancer Neg Hx    Thyroid  disease Neg Hx    Migraines Neg Hx    Headache Neg Hx   :   Social History   Socioeconomic History   Marital status: Married    Spouse name: Not on file   Number of children: Not on file   Years of education: Not on file   Highest education level: Not on file  Occupational History   Not on file  Tobacco Use   Smoking status: Former    Current packs/day: 0.00    Average packs/day: 0.5 packs/day for 20.0 years (10.0 ttl pk-yrs)    Types: Cigarettes    Start date: 09/20/1994    Quit date: 09/20/2014    Years since quitting: 9.1    Passive exposure: Never   Smokeless tobacco: Former    Types: Chew   Tobacco comments:    stopped chewing the 1990s. Quit smoking cigarettes in 2015.  Vaping Use   Vaping status: Never Used  Substance and Sexual Activity   Alcohol  use: No    Alcohol /week: 0.0 standard drinks of alcohol    Drug use: No   Sexual activity: Yes  Other Topics Concern   Not on file  Social History Narrative   Not on file   Social Drivers of Health   Financial Resource Strain: Not on file  Food Insecurity: No Food Insecurity (11/21/2023)   Hunger Vital Sign    Worried About Running Out of Food in the Last Year: Never true    Ran Out of Food in the Last Year: Never true  Transportation Needs: No Transportation Needs (11/21/2023)   PRAPARE - Administrator, Civil Service (Medical): No    Lack of Transportation (Non-Medical): No  Physical Activity: Not on file  Stress: Not on file  Social Connections: Not on file  Intimate Partner Violence: Not At Risk (11/21/2023)   Humiliation, Afraid, Rape, and Kick questionnaire    Fear of Current or Ex-Partner: No    Emotionally Abused: No    Physically Abused: No    Sexually Abused: No  :  Review of Systems  Constitutional:  Positive for weight loss.  HENT: Negative.    Eyes: Negative.   Respiratory: Negative.    Cardiovascular: Negative.    Gastrointestinal: Negative.   Genitourinary: Negative.  Musculoskeletal:  Positive for back pain.  Skin: Negative.   Neurological: Negative.   Endo/Heme/Allergies: Negative.   Psychiatric/Behavioral: Negative.       Exam:  Vital signs show temperature of 97.7.  Pulse 70.  Blood pressure 128/63.  Weight is 164 pounds.  @IPVITALS @ Physical Exam Vitals reviewed.  HENT:     Head: Normocephalic and atraumatic.  Eyes:     Pupils: Pupils are equal, round, and reactive to light.  Cardiovascular:     Rate and Rhythm: Normal rate and regular rhythm.     Heart sounds: Normal heart sounds.  Pulmonary:     Effort: Pulmonary effort is normal.     Breath sounds: Normal breath sounds.  Abdominal:     General: Bowel sounds are normal.     Palpations: Abdomen is soft.  Musculoskeletal:        General: No tenderness or deformity. Normal range of motion.     Cervical back: Normal range of motion.  Lymphadenopathy:     Cervical: No cervical adenopathy.  Skin:    General: Skin is warm and dry.     Findings: No erythema or rash.  Neurological:     Mental Status: [name] alert and oriented to person, place, and time.  Psychiatric:        Behavior: Behavior normal.        Thought Content: Thought content normal.        Judgment: Judgment normal.       Recent Labs    11/21/23 1331  WBC 10.8*  HGB 14.4  HCT 42.9  PLT 329    Recent Labs    11/21/23 1331  NA 141  K 4.4  CL 106  CO2 28  GLUCOSE 102*  BUN 19  CREATININE 0.79  CALCIUM 9.8    Blood smear review: Normochromic and normocytic population of red blood cells.  I see no rouleaux formation.  There is no immature myeloid or lymphoid cells.  I see no nucleated red blood cells.  His platelets are adequate in number and size.  Pathology: None    Assessment and Plan: Lance [name] is a very nice 58 year old white male.  He has a very unusual problem.  He has a pathologic fracture at L5.  Looks like he has disease in  other spinal bones.  Again it is hard to say what the primary is.  The best outcome clearly would be myeloma.  However, his labs certainly do not look like they are to be consistent with myeloma.  He definitely is going to need to have a biopsy.  I suspect that he is going need to have a biopsy along with kyphoplasty.  I will have to see if Dr. Beuford who is the spinal surgeon and [facility] could do this.  He will need to have a CT scan.  Maybe, we will see on the CT scan where a primary might be.  Ultimately, he will need to have a bone hardener.  However, we really cannot use his until we prove this is malignant.  I cannot imagine that this is not malignant.  I just feel bad for him..  I told him to take the tramadol that was given to him by Dr. Irving.  I would hold off on a PET scan for right now.  Ultimately, we are going to need to have a tissue diagnosis.  I spent a good hour or so with he and his wife.  They are very nice.  That said he went to the same high school as my wife did.  I am sure that she is somehow knows them.  We will try to work quickly on this.  Will see about the CT scan to be done tomorrow.  Will try to get hold of Dr. Beuford to see what he can do about this pathologic fracture.  If he cannot do anything, we will see if Interventional Radiology can help us  out.  I will see what the rest of his labs look like.  I will send off a PSA.  I will see what his myeloma studies look like.  We will plan to get him back once we have test results.

## 2023-11-22 ENCOUNTER — Ambulatory Visit (HOSPITAL_BASED_OUTPATIENT_CLINIC_OR_DEPARTMENT_OTHER)
Admission: RE | Admit: 2023-11-22 | Discharge: 2023-11-22 | Disposition: A | Discharge status: Home / Self Care | Source: Ambulatory Visit | Attending: Hematology & Oncology | Admitting: Hematology & Oncology

## 2023-11-22 ENCOUNTER — Encounter: Payer: Self-pay | Admitting: *Deleted

## 2023-11-22 ENCOUNTER — Encounter (HOSPITAL_BASED_OUTPATIENT_CLINIC_OR_DEPARTMENT_OTHER): Payer: Self-pay

## 2023-11-22 DIAGNOSIS — C801 Malignant (primary) neoplasm, unspecified: Secondary | ICD-10-CM | POA: Insufficient documentation

## 2023-11-22 DIAGNOSIS — R222 Localized swelling, mass and lump, trunk: Secondary | ICD-10-CM | POA: Diagnosis not present

## 2023-11-22 DIAGNOSIS — C7951 Secondary malignant neoplasm of bone: Secondary | ICD-10-CM | POA: Diagnosis not present

## 2023-11-22 DIAGNOSIS — K760 Fatty (change of) liver, not elsewhere classified: Secondary | ICD-10-CM | POA: Diagnosis not present

## 2023-11-22 LAB — IGG, IGA, IGM
IgA: 92 mg/dL (ref 90–386)
IgG (Immunoglobin G), Serum: 872 mg/dL (ref 603–1613)
IgM (Immunoglobulin M), Srm: 52 mg/dL (ref 20–172)

## 2023-11-22 LAB — PSA, TOTAL AND FREE
PSA, Free Pct: 22.1 %
PSA, Free: 0.31 ng/mL
Prostate Specific Ag, Serum: 1.4 ng/mL (ref 0.0–4.0)

## 2023-11-22 LAB — KAPPA/LAMBDA LIGHT CHAINS
Kappa free light chain: 10.5 mg/L (ref 3.3–19.4)
Kappa, lambda light chain ratio: 1.67 — ABNORMAL HIGH (ref 0.26–1.65)
Lambda free light chains: 6.3 mg/L (ref 5.7–26.3)

## 2023-11-22 LAB — BETA 2 MICROGLOBULIN, SERUM: Beta-2 Microglobulin: 1.5 mg/L (ref 0.6–2.4)

## 2023-11-22 LAB — TESTOSTERONE: Testosterone: 477 ng/dL (ref 264–916)

## 2023-11-22 MED ORDER — IOHEXOL 300 MG/ML  SOLN
100.0000 mL | Freq: Once | INTRAMUSCULAR | Status: AC | PRN
  Administered 2023-11-22: 100 mL via INTRAVENOUS

## 2023-11-22 NOTE — Progress Notes (Signed)
 Reviewed CT scans with Dr Timmy. He spoke to Dr Beuford and requested that they look into possible kyphoplasty with biopsy. He also wants a pulmonology consult for consideration of bronch for biopsy of one of the pathological lymph nodes. Order placed. He will call patient later today for review of CT and plan, but okayed me calling the patient with information at this time.   Called and spoke to patient. Reviewed general findings of CT. Explained that Dr Beuford would call to arrange consult for procedure and biopsy of her back. Also reviewed how tissue is a better biopsy site and that Dr Timmy would be referring him urgently to pulmonology for consideration of Bronch. He understood the reason for this.   [place] understands to call with any questions or concerns, but that Dr Timmy will reach out later today.   Oncology Nurse Navigator Documentation     11/22/2023   10:30 AM  Oncology Nurse Navigator Flowsheets  Navigator Follow Up Date: 11/27/2023  Navigator Follow Up Reason: Appointment Review  Navigator Location [facility]  Navigator Encounter Type Scan Review;Diagnostic Results;Telephone  Telephone Outgoing Call  Treatment Phase Abnormal Scans  Barriers/Navigation Needs Coordination of Care;Education  Education Other  Interventions Coordination of Care;Education;Psycho-Social Support;Referrals  Acuity Level 2-Minimal Needs (1-2 Barriers Identified)  Referrals Pulmonary  Coordination of Care Other  Education Method Verbal  Support Groups/Services Friends and Family  Time Spent with Patient 30

## 2023-11-22 NOTE — Progress Notes (Signed)
 Patient needs a STAT CT today. Authorization has been obtained. Spoke to patient who has remained NPO this morning and is okay to travel to any location for scan today.   Spoke with the downstairs schedulers and they will be able to get patient in today. They will call patient to schedule.

## 2023-11-22 NOTE — Addendum Note (Signed)
 Addended by: TIMMY COY R on: 11/22/2023 03:18 PM   Modules accepted: Orders

## 2023-11-23 LAB — PROTEIN ELECTROPHORESIS, SERUM, WITH REFLEX
A/G Ratio: 1.6 (ref 0.7–1.7)
Albumin ELP: 4.1 g/dL (ref 2.9–4.4)
Alpha-1-Globulin: 0.2 g/dL (ref 0.0–0.4)
Alpha-2-Globulin: 0.6 g/dL (ref 0.4–1.0)
Beta Globulin: 1 g/dL (ref 0.7–1.3)
Gamma Globulin: 0.7 g/dL (ref 0.4–1.8)
Globulin, Total: 2.5 g/dL (ref 2.2–3.9)
Total Protein ELP: 6.6 g/dL (ref 6.0–8.5)

## 2023-11-27 DIAGNOSIS — M545 Low back pain, unspecified: Secondary | ICD-10-CM | POA: Diagnosis not present

## 2023-11-28 ENCOUNTER — Encounter: Payer: Self-pay | Admitting: *Deleted

## 2023-11-28 NOTE — Progress Notes (Signed)
 Patient's pulmonary consult scheduled for 12/05/2023. His PET is scheduled for 12/10/2023.  Oncology Nurse Navigator Documentation     11/28/2023    2:30 PM  Oncology Nurse Navigator Flowsheets  Navigator Follow Up Date: 12/05/2023  Navigator Follow Up Reason: Scan Review  Navigator Location [facility]  Navigator Encounter Type Appt/Treatment Plan Review  Treatment Phase Abnormal Scans  Barriers/Navigation Needs Coordination of Care;Education  Interventions None Required  Acuity Level 2-Minimal Needs (1-2 Barriers Identified)  Support Groups/Services Friends and Family  Time Spent with Patient 15

## 2023-11-29 ENCOUNTER — Telehealth: Payer: Self-pay

## 2023-11-29 NOTE — Telephone Encounter (Signed)
 Notified the pt regarding his FMLA forms.Pt stated that his surgeon was in the process of filling out an FMLA form. I let the patient know that he can resubmit the form after recovery from surgery. Pt verbalized understanding. No questions or concerns to be noted at this time.

## 2023-12-01 ENCOUNTER — Other Ambulatory Visit: Payer: Self-pay | Admitting: Orthopedic Surgery

## 2023-12-05 ENCOUNTER — Ambulatory Visit (INDEPENDENT_AMBULATORY_CARE_PROVIDER_SITE_OTHER): Admitting: Student in an Organized Health Care Education/Training Program

## 2023-12-05 ENCOUNTER — Telehealth: Payer: Self-pay

## 2023-12-05 ENCOUNTER — Encounter: Payer: Self-pay | Admitting: Student in an Organized Health Care Education/Training Program

## 2023-12-05 VITALS — BP 112/70 | HR 76 | Temp 99.0°F | Ht 68.0 in | Wt 161.2 lb

## 2023-12-05 DIAGNOSIS — R590 Localized enlarged lymph nodes: Secondary | ICD-10-CM

## 2023-12-05 DIAGNOSIS — Z87891 Personal history of nicotine dependence: Secondary | ICD-10-CM | POA: Diagnosis not present

## 2023-12-05 NOTE — Progress Notes (Unsigned)
 Synopsis: Referred in for mediastinal lymphadenopathy by Timmy Maude SAUNDERS, MD  Assessment & Plan:   1. Mediastinal lymphadenopathy (Primary)  The patient is here to discuss their imaging abnormalities which include hilar and mediastinal lymphadenopathy, with a differential that includes hematologic malignancy, multiple myeloma, and sarcoidosis. [name] planned to undergo kyphoplasty next week, and tissue will be sent for pathology. I will schedule him for bronchoscopy with EBUS/TBNA for the week following (7/29), and cancel if tissue diagnosis from kyphoplasty establishes the diagnosis. Otherwise, we will proceed with EBUS/TBNA and send tissue (specifically from conglomerate of lymph nodes at station 4R) for cytology, flow cytometry, and culture.  We discussed the importance of diagnosis and staging in lung malignancies, and the approach to obtaining a tissue diagnosis which would include endobronchial ultrasound guided sampling.  We also discussed the risks associated with the procedure which include less than 1% risk of pneumothorax, infection, bleeding, and nondiagnostic procedure in detail.  I explained that patients typically are able to return home the same day of the procedure, but in rare cases admission to the hospital for observation and treatment is required.  After our discussion, the patient elected to proceed with the procedure  Recommendations:  - Procedural/ Surgical Case Request: ENDOBRONCHIAL ULTRASOUND (EBUS); Future  I spent 60 minutes caring for this patient today, including preparing to see the patient, obtaining a medical history , reviewing a separately obtained history, performing a medically appropriate examination and/or evaluation, counseling and educating the patient/family/caregiver, ordering medications, tests, or procedures, documenting clinical information in the electronic health record, and independently interpreting results (not separately reported/billed) and  communicating results to the patient/family/caregiver  [name], MD [facility]   End of visit medications:  No orders of the defined types were placed in this encounter.    Current Outpatient Medications:    ALPRAZolam (XANAX) 0.5 MG tablet, Take 0.5 mg by mouth 2 (two) times daily as needed for anxiety., Disp: , Rfl:    ibuprofen  (ADVIL ) 800 MG tablet, Take 1 tablet (800 mg total) by mouth 3 (three) times daily., Disp: 21 tablet, Rfl: 0   levothyroxine (SYNTHROID) 100 MCG tablet, Take 100 mcg by mouth daily., Disp: , Rfl:    traMADol  (ULTRAM ) 50 MG tablet, Take 1 tablet (50 mg total) by mouth every 4 (four) hours as needed. You may be able to take 1-2, if needed, for pain. (Patient taking differently: Take 100 mg by mouth 3 (three) times daily as needed (back pain).), Disp: 90 tablet, Rfl: 0   zolpidem  (AMBIEN ) 10 MG tablet, Take 10 mg by mouth at bedtime., Disp: , Rfl:    Subjective:   PATIENT ID: Lance [name] GENDER: male DOB: 07-Feb-1966, MRN: [id number]  Chief Complaint  Patient presents with   New Patient (Initial Visit)    Stopped smoking in 2015, CT: 11-22-23  Breathing has been fine so far, and wanting to go over the CT from recently.    HPI  Patient is a pleasant 58 year old male presenting for the evaluation of mediastinal lymph adenopathy. He has a history of Graves' disease.  Patient had developed back pain around April of 2025 that he initially thought was work related. He was seen by orthopedics and underwent an MRI of the lumbar spine that showed marrow replacement at L4 and L5 with a pathological fracture. He also had small lesions at T12 and L1. He then underwent a CT scan of the chest that showed mediastinal lymphadenopathy. He was  seen by Dr. Timmy from oncology and was referred to pulmonary for consideration of EBUS.  [name] asymptomatic from a respiratory perspective. He has no shortness of breath or chest pain. He does report  occasional night sweats. His main symptom is that of back pain. [name] scheduled for kyphoplasty next week.  He works at a [employer]. He has a history of smoking for 20 years, and quit in 2015. He has around 20 pack years of smoking history.  Ancillary information including prior medications, full medical/surgical/family/social histories, and PFTs (when available) are listed below and have been reviewed.   Review of Systems  Constitutional:  Negative for chills, fever and weight loss.  Respiratory:  Negative for cough, hemoptysis, sputum production, shortness of breath and wheezing.   Cardiovascular:  Negative for chest pain.  Musculoskeletal:  Positive for back pain.     Objective:   Vitals:   12/05/23 1459  BP: 112/70  Pulse: 76  Temp: 99 F (37.2 C)  TempSrc: Oral  SpO2: 97%  Weight: 161 lb 3.2 oz (73.1 kg)  Height: 5' 8 (1.727 m)   97% on RA BMI Readings from Last 3 Encounters:  12/05/23 24.51 kg/m  11/21/23 24.94 kg/m  08/10/22 25.13 kg/m   Wt Readings from Last 3 Encounters:  12/05/23 161 lb 3.2 oz (73.1 kg)  11/21/23 164 lb (74.4 kg)  08/10/22 165 lb 4.8 oz (75 kg)    Physical Exam    Ancillary Information    Past Medical History:  Diagnosis Date   Graves disease    dx'd 01/2016   History of kidney stones    Hyperthyroidism    Hypothyroid    Leukopenia    Neutropenia (HCC)    Neutropenic fever (HCC)    Pneumonia ~ 2015 X 1     Family History  Problem Relation Age of Onset   Sleep apnea Mother    Bladder Cancer Father    Hypertension Father    Colon cancer Neg Hx    Esophageal cancer Neg Hx    Prostate cancer Neg Hx    Rectal cancer Neg Hx    Stomach cancer Neg Hx    Thyroid  disease Neg Hx    Migraines Neg Hx    Headache Neg Hx      Past Surgical History:  Procedure Laterality Date   CLAVICLE SURGERY Left 1980s   has plates and pins   FRACTURE SURGERY     I & D EXTREMITY Right 11/04/2016   Procedure: IRRIGATION AND  DEBRIDEMENT INDEX FINGER AND PINNING;  Surgeon: Shari Easter, MD;  Location: [facility] OR;  Service: Orthopedics;  Laterality: Right;   LITHOTRIPSY  1990s    Social History   Socioeconomic History   Marital status: Married    Spouse name: Not on file   Number of children: Not on file   Years of education: Not on file   Highest education level: Not on file  Occupational History   Not on file  Tobacco Use   Smoking status: Former    Current packs/day: 0.00    Average packs/day: 0.5 packs/day for 20.0 years (10.0 ttl pk-yrs)    Types: Cigarettes    Start date: 09/20/1994    Quit date: 09/20/2014    Years since quitting: 9.2    Passive exposure: Never   Smokeless tobacco: Former    Types: Chew   Tobacco comments:    stopped chewing the 1990s. Quit smoking cigarettes in 2015.  Vaping  Use   Vaping status: Never Used  Substance and Sexual Activity   Alcohol  use: No    Alcohol /week: 0.0 standard drinks of alcohol    Drug use: No   Sexual activity: Yes  Other Topics Concern   Not on file  Social History Narrative   Not on file   Social Drivers of Health   Financial Resource Strain: Not on file  Food Insecurity: No Food Insecurity (11/21/2023)   Hunger Vital Sign    Worried About Running Out of Food in the Last Year: Never true    Ran Out of Food in the Last Year: Never true  Transportation Needs: No Transportation Needs (11/21/2023)   PRAPARE - Administrator, Civil Service (Medical): No    Lack of Transportation (Non-Medical): No  Physical Activity: Not on file  Stress: Not on file  Social Connections: Not on file  Intimate Partner Violence: Not At Risk (11/21/2023)   Humiliation, Afraid, Rape, and Kick questionnaire    Fear of Current or Ex-Partner: No    Emotionally Abused: No    Physically Abused: No    Sexually Abused: No     Allergies  Allergen Reactions   Methimazole  Other (See Comments)    WBC drops   Methocarbamol Other (See Comments)    WBC drops      CBC    Component Value Date/Time   WBC 10.8 (H) 11/21/2023 1331   WBC 13.2 (H) 04/01/2016 0515   RBC 4.69 11/21/2023 1331   HGB 14.4 11/21/2023 1331   HCT 42.9 11/21/2023 1331   PLT 329 11/21/2023 1331   MCV 91.5 11/21/2023 1331   MCH 30.7 11/21/2023 1331   MCHC 33.6 11/21/2023 1331   RDW 13.7 11/21/2023 1331   LYMPHSABS 1.7 11/21/2023 1331   MONOABS 0.7 11/21/2023 1331   EOSABS 0.0 11/21/2023 1331   BASOSABS 0.0 11/21/2023 1331    Pulmonary Functions Testing Results:     No data to display          Outpatient Medications Prior to Visit  Medication Sig Dispense Refill   ALPRAZolam (XANAX) 0.5 MG tablet Take 0.5 mg by mouth 2 (two) times daily as needed for anxiety.     ibuprofen  (ADVIL ) 800 MG tablet Take 1 tablet (800 mg total) by mouth 3 (three) times daily. 21 tablet 0   levothyroxine (SYNTHROID) 100 MCG tablet Take 100 mcg by mouth daily.     traMADol  (ULTRAM ) 50 MG tablet Take 1 tablet (50 mg total) by mouth every 4 (four) hours as needed. You may be able to take 1-2, if needed, for pain. (Patient taking differently: Take 100 mg by mouth 3 (three) times daily as needed (back pain).) 90 tablet 0   zolpidem  (AMBIEN ) 10 MG tablet Take 10 mg by mouth at bedtime.     No facility-administered medications prior to visit.

## 2023-12-05 NOTE — H&P (View-Only) (Signed)
 Synopsis: Referred in for mediastinal lymphadenopathy by Timmy Maude SAUNDERS, MD  Assessment & Plan:   1. Mediastinal lymphadenopathy (Primary)  The patient is here to discuss their imaging abnormalities which include hilar and mediastinal lymphadenopathy, with a differential that includes hematologic malignancy, multiple myeloma, and sarcoidosis. [name] planned to undergo kyphoplasty next week, and tissue will be sent for pathology. I will schedule him for bronchoscopy with EBUS/TBNA for the week following (7/29), and cancel if tissue diagnosis from kyphoplasty establishes the diagnosis. Otherwise, we will proceed with EBUS/TBNA and send tissue (specifically from conglomerate of lymph nodes at station 4R) for cytology, flow cytometry, and culture.  We discussed the importance of diagnosis and staging in lung malignancies, and the approach to obtaining a tissue diagnosis which would include endobronchial ultrasound guided sampling.  We also discussed the risks associated with the procedure which include less than 1% risk of pneumothorax, infection, bleeding, and nondiagnostic procedure in detail.  I explained that patients typically are able to return home the same day of the procedure, but in rare cases admission to the hospital for observation and treatment is required.  After our discussion, the patient elected to proceed with the procedure  Recommendations:  - Procedural/ Surgical Case Request: ENDOBRONCHIAL ULTRASOUND (EBUS); Future  I spent 60 minutes caring for this patient today, including preparing to see the patient, obtaining a medical history , reviewing a separately obtained history, performing a medically appropriate examination and/or evaluation, counseling and educating the patient/family/caregiver, ordering medications, tests, or procedures, documenting clinical information in the electronic health record, and independently interpreting results (not separately reported/billed) and  communicating results to the patient/family/caregiver  [name], MD [facility]   End of visit medications:  No orders of the defined types were placed in this encounter.    Current Outpatient Medications:    ALPRAZolam (XANAX) 0.5 MG tablet, Take 0.5 mg by mouth 2 (two) times daily as needed for anxiety., Disp: , Rfl:    ibuprofen  (ADVIL ) 800 MG tablet, Take 1 tablet (800 mg total) by mouth 3 (three) times daily., Disp: 21 tablet, Rfl: 0   levothyroxine (SYNTHROID) 100 MCG tablet, Take 100 mcg by mouth daily., Disp: , Rfl:    traMADol  (ULTRAM ) 50 MG tablet, Take 1 tablet (50 mg total) by mouth every 4 (four) hours as needed. You may be able to take 1-2, if needed, for pain. (Patient taking differently: Take 100 mg by mouth 3 (three) times daily as needed (back pain).), Disp: 90 tablet, Rfl: 0   zolpidem  (AMBIEN ) 10 MG tablet, Take 10 mg by mouth at bedtime., Disp: , Rfl:    Subjective:   PATIENT ID: Lance [name] GENDER: male DOB: 1965-08-02, MRN: [id number]  Chief Complaint  Patient presents with   New Patient (Initial Visit)    Stopped smoking in 2015, CT: 11-22-23  Breathing has been fine so far, and wanting to go over the CT from recently.    HPI  Patient is a pleasant 58 year old male presenting for the evaluation of mediastinal lymph adenopathy. He has a history of Graves' disease.  Patient had developed back pain around April of 2025 that he initially thought was work related. He was seen by orthopedics and underwent an MRI of the lumbar spine that showed marrow replacement at L4 and L5 with a pathological fracture. He also had small lesions at T12 and L1. He then underwent a CT scan of the chest that showed mediastinal lymphadenopathy. He was  seen by Dr. Timmy from oncology and was referred to pulmonary for consideration of EBUS.  [name] asymptomatic from a respiratory perspective. He has no shortness of breath or chest pain. He does report  occasional night sweats. His main symptom is that of back pain. [name] scheduled for kyphoplasty next week.  He works at a [employer]. He has a history of smoking for 20 years, and quit in 2015. He has around 20 pack years of smoking history.  Ancillary information including prior medications, full medical/surgical/family/social histories, and PFTs (when available) are listed below and have been reviewed.   Review of Systems  Constitutional:  Negative for chills, fever and weight loss.  Respiratory:  Negative for cough, hemoptysis, sputum production, shortness of breath and wheezing.   Cardiovascular:  Negative for chest pain.  Musculoskeletal:  Positive for back pain.     Objective:   Vitals:   12/05/23 1459  BP: 112/70  Pulse: 76  Temp: 99 F (37.2 C)  TempSrc: Oral  SpO2: 97%  Weight: 161 lb 3.2 oz (73.1 kg)  Height: 5' 8 (1.727 m)   97% on RA BMI Readings from Last 3 Encounters:  12/05/23 24.51 kg/m  11/21/23 24.94 kg/m  08/10/22 25.13 kg/m   Wt Readings from Last 3 Encounters:  12/05/23 161 lb 3.2 oz (73.1 kg)  11/21/23 164 lb (74.4 kg)  08/10/22 165 lb 4.8 oz (75 kg)    Physical Exam    Ancillary Information    Past Medical History:  Diagnosis Date   Graves disease    dx'd 01/2016   History of kidney stones    Hyperthyroidism    Hypothyroid    Leukopenia    Neutropenia (HCC)    Neutropenic fever (HCC)    Pneumonia ~ 2015 X 1     Family History  Problem Relation Age of Onset   Sleep apnea Mother    Bladder Cancer Father    Hypertension Father    Colon cancer Neg Hx    Esophageal cancer Neg Hx    Prostate cancer Neg Hx    Rectal cancer Neg Hx    Stomach cancer Neg Hx    Thyroid  disease Neg Hx    Migraines Neg Hx    Headache Neg Hx      Past Surgical History:  Procedure Laterality Date   CLAVICLE SURGERY Left 1980s   has plates and pins   FRACTURE SURGERY     I & D EXTREMITY Right 11/04/2016   Procedure: IRRIGATION AND  DEBRIDEMENT INDEX FINGER AND PINNING;  Surgeon: Shari Easter, MD;  Location: [facility] OR;  Service: Orthopedics;  Laterality: Right;   LITHOTRIPSY  1990s    Social History   Socioeconomic History   Marital status: Married    Spouse name: Not on file   Number of children: Not on file   Years of education: Not on file   Highest education level: Not on file  Occupational History   Not on file  Tobacco Use   Smoking status: Former    Current packs/day: 0.00    Average packs/day: 0.5 packs/day for 20.0 years (10.0 ttl pk-yrs)    Types: Cigarettes    Start date: 09/20/1994    Quit date: 09/20/2014    Years since quitting: 9.2    Passive exposure: Never   Smokeless tobacco: Former    Types: Chew   Tobacco comments:    stopped chewing the 1990s. Quit smoking cigarettes in 2015.  Vaping  Use   Vaping status: Never Used  Substance and Sexual Activity   Alcohol  use: No    Alcohol /week: 0.0 standard drinks of alcohol    Drug use: No   Sexual activity: Yes  Other Topics Concern   Not on file  Social History Narrative   Not on file   Social Drivers of Health   Financial Resource Strain: Not on file  Food Insecurity: No Food Insecurity (11/21/2023)   Hunger Vital Sign    Worried About Running Out of Food in the Last Year: Never true    Ran Out of Food in the Last Year: Never true  Transportation Needs: No Transportation Needs (11/21/2023)   PRAPARE - Administrator, Civil Service (Medical): No    Lack of Transportation (Non-Medical): No  Physical Activity: Not on file  Stress: Not on file  Social Connections: Not on file  Intimate Partner Violence: Not At Risk (11/21/2023)   Humiliation, Afraid, Rape, and Kick questionnaire    Fear of Current or Ex-Partner: No    Emotionally Abused: No    Physically Abused: No    Sexually Abused: No     Allergies  Allergen Reactions   Methimazole  Other (See Comments)    WBC drops   Methocarbamol Other (See Comments)    WBC drops      CBC    Component Value Date/Time   WBC 10.8 (H) 11/21/2023 1331   WBC 13.2 (H) 04/01/2016 0515   RBC 4.69 11/21/2023 1331   HGB 14.4 11/21/2023 1331   HCT 42.9 11/21/2023 1331   PLT 329 11/21/2023 1331   MCV 91.5 11/21/2023 1331   MCH 30.7 11/21/2023 1331   MCHC 33.6 11/21/2023 1331   RDW 13.7 11/21/2023 1331   LYMPHSABS 1.7 11/21/2023 1331   MONOABS 0.7 11/21/2023 1331   EOSABS 0.0 11/21/2023 1331   BASOSABS 0.0 11/21/2023 1331    Pulmonary Functions Testing Results:     No data to display          Outpatient Medications Prior to Visit  Medication Sig Dispense Refill   ALPRAZolam (XANAX) 0.5 MG tablet Take 0.5 mg by mouth 2 (two) times daily as needed for anxiety.     ibuprofen  (ADVIL ) 800 MG tablet Take 1 tablet (800 mg total) by mouth 3 (three) times daily. 21 tablet 0   levothyroxine (SYNTHROID) 100 MCG tablet Take 100 mcg by mouth daily.     traMADol  (ULTRAM ) 50 MG tablet Take 1 tablet (50 mg total) by mouth every 4 (four) hours as needed. You may be able to take 1-2, if needed, for pain. (Patient taking differently: Take 100 mg by mouth 3 (three) times daily as needed (back pain).) 90 tablet 0   zolpidem  (AMBIEN ) 10 MG tablet Take 10 mg by mouth at bedtime.     No facility-administered medications prior to visit.

## 2023-12-05 NOTE — Telephone Encounter (Signed)
 Was this suppose to have a code 68372

## 2023-12-05 NOTE — Telephone Encounter (Signed)
 Bronchoscopy: Robotic Date/Time: 12/18/23 at 8:00am Diagnosis: R91.1 CPT code: 68347 and [address] [facility] Please See Bronch Info   Case Number: [id number]

## 2023-12-05 NOTE — Pre-Procedure Instructions (Signed)
 Surgical Instructions   Your procedure is scheduled on Wednesday, July 23rd. Report to [facility] Main Entrance A at 09:15 A.M., then check in with the Admitting office. Any questions or running late day of surgery: call [phone]  Questions prior to your surgery date: call [phone], Monday-Friday, 8am-4pm. If you experience any cold or flu symptoms such as cough, fever, chills, shortness of breath, etc. between now and your scheduled surgery, please notify us  at the above number.     Remember:  Do not eat after midnight the night before your surgery  You may drink clear liquids until 09:15 AM the morning of your surgery.   Clear liquids allowed are: Water, Non-Citrus Juices (without pulp), Carbonated Beverages, Clear Tea (no milk, honey, etc.), Black Coffee Only (NO MILK, CREAM OR POWDERED CREAMER of any kind), and Gatorade.  Patient Instructions  The night before surgery:  No food after midnight. ONLY clear liquids after midnight  The day of surgery (if you do NOT have diabetes):  Drink ONE (1) Pre-Surgery Clear Ensure by 09:15 AM the morning of surgery. Drink in one sitting. Do not sip.  This drink was given to you during your hospital  pre-op appointment visit.  Nothing else to drink after completing the  Pre-Surgery Clear Ensure.         If you have questions, please contact your surgeon's office.    Take these medicines the morning of surgery with A SIP OF WATER  levothyroxine (SYNTHROID)    May take these medicines IF NEEDED: ALPRAZolam (XANAX)  traMADol  (ULTRAM )    One week prior to surgery, STOP taking any Aspirin (unless otherwise instructed by your surgeon) Aleve, Naproxen, Ibuprofen , Motrin , Advil , Goody's, BC's, all herbal medications, fish oil, and non-prescription vitamins.                     Do NOT Smoke (Tobacco/Vaping) for 24 hours prior to your procedure.  If you use a CPAP at night, you may bring your mask/headgear for your overnight stay.    You will be asked to remove any contacts, glasses, piercing's, hearing aid's, dentures/partials prior to surgery. Please bring cases for these items if needed.    Patients discharged the day of surgery will not be allowed to drive home, and someone needs to stay with them for 24 hours.  SURGICAL WAITING ROOM VISITATION Patients may have no more than 2 support people in the waiting area - these visitors may rotate.   Pre-op nurse will coordinate an appropriate time for 1 ADULT support person, who may not rotate, to accompany patient in pre-op.  Children under the age of 46 must have an adult with them who is not the patient and must remain in the main waiting area with an adult.  If the patient needs to stay at the hospital during part of their recovery, the visitor guidelines for inpatient rooms apply.  Please refer to the [facility] website for the visitor guidelines for any additional information.   If you received a COVID test during your pre-op visit  it is requested that you wear a mask when out in public, stay away from anyone that may not be feeling well and notify your surgeon if you develop symptoms. If you have been in contact with anyone that has tested positive in the last 10 days please notify you surgeon.      Pre-operative 5 CHG Bathing Instructions   You can play a key role in reducing the  risk of infection after surgery. Your skin needs to be as free of germs as possible. You can reduce the number of germs on your skin by washing with CHG (chlorhexidine  gluconate) soap before surgery. CHG is an antiseptic soap that kills germs and continues to kill germs even after washing.   DO NOT use if you have an allergy to chlorhexidine /CHG or antibacterial soaps. If your skin becomes reddened or irritated, stop using the CHG and notify one of our RNs at [phone].   Please shower with the CHG soap starting 4 days before surgery using the following schedule:     Please keep  in mind the following:  DO NOT shave, including legs and underarms, starting the day of your first shower.   You may shave your face at any point before/day of surgery.  Place clean sheets on your bed the day you start using CHG soap. Use a clean washcloth (not used since being washed) for each shower. DO NOT sleep with pets once you start using the CHG.   CHG Shower Instructions:  Wash your face and private area with normal soap. If you choose to wash your hair, wash first with your normal shampoo.  After you use shampoo/soap, rinse your hair and body thoroughly to remove shampoo/soap residue.  Turn the water OFF and apply about 3 tablespoons (45 ml) of CHG soap to a CLEAN washcloth.  Apply CHG soap ONLY FROM YOUR NECK DOWN TO YOUR TOES (washing for 3-5 minutes)  DO NOT use CHG soap on face, private areas, open wounds, or sores.  Pay special attention to the area where your surgery is being performed.  If you are having back surgery, having someone wash your back for you may be helpful. Wait 2 minutes after CHG soap is applied, then you may rinse off the CHG soap.  Pat dry with a clean towel  Put on clean clothes/pajamas   If you choose to wear lotion, please use ONLY the CHG-compatible lotions that are listed below.  Additional instructions for the day of surgery: DO NOT APPLY any lotions, deodorants, cologne, or perfumes.   Do not bring valuables to the hospital. [facility] is not responsible for any belongings/valuables. Do not wear nail polish, gel polish, artificial nails, or any other type of covering on natural nails (fingers and toes) Do not wear jewelry or makeup Put on clean/comfortable clothes.  Please brush your teeth.  Ask your nurse before applying any prescription medications to the skin.     CHG Compatible Lotions   Aveeno Moisturizing lotion  Cetaphil Moisturizing Cream  Cetaphil Moisturizing Lotion  Clairol Herbal Essence Moisturizing Lotion, Dry Skin   Clairol Herbal Essence Moisturizing Lotion, Extra Dry Skin  Clairol Herbal Essence Moisturizing Lotion, Normal Skin  Curel Age Defying Therapeutic Moisturizing Lotion with Alpha Hydroxy  Curel Extreme Care Body Lotion  Curel Soothing Hands Moisturizing Hand Lotion  Curel Therapeutic Moisturizing Cream, Fragrance-Free  Curel Therapeutic Moisturizing Lotion, Fragrance-Free  Curel Therapeutic Moisturizing Lotion, Original Formula  Eucerin Daily Replenishing Lotion  Eucerin Dry Skin Therapy Plus Alpha Hydroxy Crme  Eucerin Dry Skin Therapy Plus Alpha Hydroxy Lotion  Eucerin Original Crme  Eucerin Original Lotion  Eucerin Plus Crme Eucerin Plus Lotion  Eucerin TriLipid Replenishing Lotion  Keri Anti-Bacterial Hand Lotion  Keri Deep Conditioning Original Lotion Dry Skin Formula Softly Scented  Keri Deep Conditioning Original Lotion, Fragrance Free Sensitive Skin Formula  Keri Lotion Fast Absorbing Fragrance Free Sensitive Skin Formula  Keri Lotion Fast  Absorbing Softly Scented Dry Skin Formula  Keri Original Lotion  Keri Skin Renewal Lotion Keri Silky Smooth Lotion  Keri Silky Smooth Sensitive Skin Lotion  Nivea Body Creamy Conditioning Oil  Nivea Body Extra Enriched Lotion  Nivea Body Original Lotion  Nivea Body Sheer Moisturizing Lotion Nivea Crme  Nivea Skin Firming Lotion  NutraDerm 30 Skin Lotion  NutraDerm Skin Lotion  NutraDerm Therapeutic Skin Cream  NutraDerm Therapeutic Skin Lotion  ProShield Protective Hand Cream  Provon moisturizing lotion  Please read over the following fact sheets that you were given.

## 2023-12-06 ENCOUNTER — Encounter (HOSPITAL_COMMUNITY)
Admission: RE | Admit: 2023-12-06 | Discharge: 2023-12-06 | Disposition: A | Discharge status: Home / Self Care | Source: Ambulatory Visit | Attending: Orthopedic Surgery | Admitting: Orthopedic Surgery

## 2023-12-06 ENCOUNTER — Encounter (HOSPITAL_COMMUNITY): Payer: Self-pay

## 2023-12-06 ENCOUNTER — Other Ambulatory Visit: Payer: Self-pay

## 2023-12-06 ENCOUNTER — Encounter: Payer: Self-pay | Admitting: *Deleted

## 2023-12-06 VITALS — BP 135/82 | HR 78 | Temp 98.4°F | Resp 17 | Ht 68.0 in | Wt 161.4 lb

## 2023-12-06 DIAGNOSIS — Z01812 Encounter for preprocedural laboratory examination: Secondary | ICD-10-CM | POA: Insufficient documentation

## 2023-12-06 DIAGNOSIS — Z01818 Encounter for other preprocedural examination: Secondary | ICD-10-CM

## 2023-12-06 LAB — SURGICAL PCR SCREEN
MRSA, PCR: NEGATIVE
Staphylococcus aureus: NEGATIVE

## 2023-12-06 NOTE — Progress Notes (Signed)
 PCP - Lance [name] COME Cardiologist - denies  PPM/ICD - denies Device Orders -  Rep Notified -   Chest x-ray - na EKG - na Stress Test - denies ECHO - denies Cardiac Cath - denies  Sleep Study - denies CPAP -   Fasting Blood Sugar - na Checks Blood Sugar _____ times a day  Last dose of GLP1 agonist-  na GLP1 instructions:   Blood Thinner Instructions:na Aspirin Instructions:na  ERAS Protcol - clears until 0915 PRE-SURGERY Ensure or G2- Ensure  COVID TEST- na   Anesthesia review: no  Patient denies shortness of breath, fever, cough and chest pain at PAT appointment   All instructions explained to the patient, with a verbal understanding of the material. Patient agrees to go over the instructions while at home for a better understanding.  The opportunity to ask questions was provided.

## 2023-12-06 NOTE — Progress Notes (Signed)
 Patient seen by pulmonary and plan made for [facility]. Scheduled for 12/18/2023. Patient also has a kyphoplasty scheduled for 12/12/2023.  Oncology Nurse Navigator Documentation     12/06/2023    8:00 AM  Oncology Nurse Navigator Flowsheets  Navigator Follow Up Date: 12/18/2023  Navigator Follow Up Reason: Other:  Navigator Location [facility]  Navigator Encounter Type Appt/Treatment Plan Review  Treatment Phase Abnormal Scans  Barriers/Navigation Needs Coordination of Care;Education  Interventions None Required  Acuity Level 2-Minimal Needs (1-2 Barriers Identified)  Support Groups/Services Friends and Family  Time Spent with Patient 15

## 2023-12-06 NOTE — Telephone Encounter (Signed)
 For the codes 68372, [id number], [phone] Prior Auth Not Required Refer # [id number] and Refer # [id number]

## 2023-12-10 ENCOUNTER — Encounter (HOSPITAL_COMMUNITY)
Admission: RE | Admit: 2023-12-10 | Discharge: 2023-12-10 | Disposition: A | Discharge status: Home / Self Care | Source: Ambulatory Visit | Attending: Hematology & Oncology | Admitting: Hematology & Oncology

## 2023-12-10 DIAGNOSIS — C799 Secondary malignant neoplasm of unspecified site: Secondary | ICD-10-CM | POA: Diagnosis not present

## 2023-12-10 DIAGNOSIS — C801 Malignant (primary) neoplasm, unspecified: Secondary | ICD-10-CM | POA: Diagnosis not present

## 2023-12-10 DIAGNOSIS — C7951 Secondary malignant neoplasm of bone: Secondary | ICD-10-CM | POA: Insufficient documentation

## 2023-12-10 LAB — GLUCOSE, CAPILLARY: Glucose-Capillary: 88 mg/dL (ref 70–99)

## 2023-12-10 MED ORDER — FLUDEOXYGLUCOSE F - 18 (FDG) INJECTION
7.8000 | Freq: Once | INTRAVENOUS | Status: AC | PRN
  Administered 2023-12-10: 7.8 via INTRAVENOUS

## 2023-12-12 ENCOUNTER — Encounter: Payer: Self-pay | Admitting: *Deleted

## 2023-12-12 ENCOUNTER — Encounter (HOSPITAL_COMMUNITY)
Admission: RE | Disposition: A | Discharge status: Home / Self Care | Payer: Self-pay | Source: Home / Self Care | Attending: Orthopedic Surgery

## 2023-12-12 ENCOUNTER — Ambulatory Visit (HOSPITAL_COMMUNITY): Payer: Self-pay | Admitting: Anesthesiology

## 2023-12-12 ENCOUNTER — Other Ambulatory Visit: Payer: Self-pay

## 2023-12-12 ENCOUNTER — Encounter (HOSPITAL_COMMUNITY): Payer: Self-pay | Admitting: Orthopedic Surgery

## 2023-12-12 ENCOUNTER — Ambulatory Visit (HOSPITAL_COMMUNITY)

## 2023-12-12 ENCOUNTER — Ambulatory Visit (HOSPITAL_COMMUNITY)
Admission: RE | Admit: 2023-12-12 | Discharge: 2023-12-12 | Disposition: A | Discharge status: Home / Self Care | Attending: Orthopedic Surgery | Admitting: Orthopedic Surgery

## 2023-12-12 DIAGNOSIS — M899 Disorder of bone, unspecified: Secondary | ICD-10-CM | POA: Insufficient documentation

## 2023-12-12 DIAGNOSIS — Z981 Arthrodesis status: Secondary | ICD-10-CM | POA: Diagnosis not present

## 2023-12-12 DIAGNOSIS — Z87891 Personal history of nicotine dependence: Secondary | ICD-10-CM | POA: Insufficient documentation

## 2023-12-12 DIAGNOSIS — M8008XA Age-related osteoporosis with current pathological fracture, vertebra(e), initial encounter for fracture: Secondary | ICD-10-CM | POA: Insufficient documentation

## 2023-12-12 DIAGNOSIS — M898X8 Other specified disorders of bone, other site: Secondary | ICD-10-CM | POA: Diagnosis not present

## 2023-12-12 DIAGNOSIS — D492 Neoplasm of unspecified behavior of bone, soft tissue, and skin: Secondary | ICD-10-CM | POA: Diagnosis not present

## 2023-12-12 HISTORY — PX: KYPHOPLASTY: SHX5884

## 2023-12-12 SURGERY — KYPHOPLASTY
Anesthesia: General | Site: Spine Lumbar

## 2023-12-12 MED ORDER — LACTATED RINGERS IV SOLN: INTRAVENOUS | Status: DC

## 2023-12-12 MED ORDER — CHLORHEXIDINE GLUCONATE 0.12 % MT SOLN
15.0000 mL | Freq: Once | OROMUCOSAL | Status: AC
  Administered 2023-12-12: 15 mL via OROMUCOSAL
  Filled 2023-12-12: qty 15

## 2023-12-12 MED ORDER — ORAL CARE MOUTH RINSE: 15.0000 mL | Freq: Once | OROMUCOSAL | Status: AC

## 2023-12-12 MED ORDER — ONDANSETRON HCL 4 MG/2ML IJ SOLN
INTRAMUSCULAR | Status: AC
  Filled 2023-12-12: qty 2

## 2023-12-12 MED ORDER — LIDOCAINE 2% (20 MG/ML) 5 ML SYRINGE
INTRAMUSCULAR | Status: AC
  Filled 2023-12-12: qty 5

## 2023-12-12 MED ORDER — CEFAZOLIN SODIUM-DEXTROSE 2-4 GM/100ML-% IV SOLN
2.0000 g | INTRAVENOUS | Status: AC
  Administered 2023-12-12: 2 g via INTRAVENOUS
  Filled 2023-12-12: qty 100

## 2023-12-12 MED ORDER — EPHEDRINE SULFATE-NACL 50-0.9 MG/10ML-% IV SOSY
PREFILLED_SYRINGE | INTRAVENOUS | Status: DC | PRN
  Administered 2023-12-12: 10 mg via INTRAVENOUS
  Administered 2023-12-12: 5 mg via INTRAVENOUS
  Administered 2023-12-12: 10 mg via INTRAVENOUS

## 2023-12-12 MED ORDER — PHENYLEPHRINE 80 MCG/ML (10ML) SYRINGE FOR IV PUSH (FOR BLOOD PRESSURE SUPPORT)
PREFILLED_SYRINGE | INTRAVENOUS | Status: DC | PRN
  Administered 2023-12-12 (×2): 80 ug via INTRAVENOUS
  Administered 2023-12-12: 160 ug via INTRAVENOUS
  Administered 2023-12-12: 80 ug via INTRAVENOUS

## 2023-12-12 MED ORDER — SUGAMMADEX SODIUM 200 MG/2ML IV SOLN
INTRAVENOUS | Status: DC | PRN
  Administered 2023-12-12: 200 mg via INTRAVENOUS

## 2023-12-12 MED ORDER — LIDOCAINE 2% (20 MG/ML) 5 ML SYRINGE
INTRAMUSCULAR | Status: DC | PRN
  Administered 2023-12-12: 100 mg via INTRAVENOUS

## 2023-12-12 MED ORDER — DEXAMETHASONE SODIUM PHOSPHATE 10 MG/ML IJ SOLN
INTRAMUSCULAR | Status: AC
Start: 2023-12-12 — End: 2023-12-12
  Filled 2023-12-12: qty 1

## 2023-12-12 MED ORDER — MIDAZOLAM HCL 2 MG/2ML IJ SOLN
INTRAMUSCULAR | Status: AC
  Filled 2023-12-12: qty 2

## 2023-12-12 MED ORDER — FENTANYL CITRATE (PF) 100 MCG/2ML IJ SOLN
INTRAMUSCULAR | Status: AC
  Filled 2023-12-12: qty 2

## 2023-12-12 MED ORDER — BACITRACIN 500 UNIT/GM EX OINT
TOPICAL_OINTMENT | CUTANEOUS | Status: DC | PRN
Start: 2023-12-12 — End: 2023-12-12
  Administered 2023-12-12: 1 via TOPICAL

## 2023-12-12 MED ORDER — ACETAMINOPHEN 10 MG/ML IV SOLN
1000.0000 mg | Freq: Once | INTRAVENOUS | Status: DC | PRN
Start: 2023-12-12 — End: 2023-12-12

## 2023-12-12 MED ORDER — POVIDONE-IODINE 7.5 % EX SOLN: Freq: Once | CUTANEOUS | Status: AC

## 2023-12-12 MED ORDER — 0.9 % SODIUM CHLORIDE (POUR BTL) OPTIME
TOPICAL | Status: DC | PRN
  Administered 2023-12-12: 1000 mL

## 2023-12-12 MED ORDER — SUGAMMADEX SODIUM 200 MG/2ML IV SOLN
INTRAVENOUS | Status: AC
Start: 2023-12-12 — End: 2023-12-12
  Filled 2023-12-12: qty 2

## 2023-12-12 MED ORDER — BUPIVACAINE-EPINEPHRINE (PF) 0.25% -1:200000 IJ SOLN
INTRAMUSCULAR | Status: DC | PRN
  Administered 2023-12-12: 10 mL

## 2023-12-12 MED ORDER — ROCURONIUM BROMIDE 10 MG/ML (PF) SYRINGE
PREFILLED_SYRINGE | INTRAVENOUS | Status: DC | PRN
  Administered 2023-12-12: 60 mg via INTRAVENOUS

## 2023-12-12 MED ORDER — EPHEDRINE 5 MG/ML INJ
INTRAVENOUS | Status: AC
  Filled 2023-12-12: qty 5

## 2023-12-12 MED ORDER — DEXAMETHASONE SODIUM PHOSPHATE 10 MG/ML IJ SOLN
INTRAMUSCULAR | Status: DC | PRN
  Administered 2023-12-12: 10 mg via INTRAVENOUS

## 2023-12-12 MED ORDER — ROCURONIUM BROMIDE 10 MG/ML (PF) SYRINGE
PREFILLED_SYRINGE | INTRAVENOUS | Status: AC
  Filled 2023-12-12: qty 10

## 2023-12-12 MED ORDER — CYCLOBENZAPRINE HCL 5 MG PO TABS: 5.0000 mg | ORAL_TABLET | Freq: Three times a day (TID) | ORAL | 0 refills | Status: DC | PRN

## 2023-12-12 MED ORDER — OXYCODONE HCL 5 MG/5ML PO SOLN: 5.0000 mg | Freq: Once | ORAL | Status: AC | PRN

## 2023-12-12 MED ORDER — IOPAMIDOL (ISOVUE-300) INJECTION 61%
INTRAVENOUS | Status: DC | PRN
  Administered 2023-12-12: 100 mL

## 2023-12-12 MED ORDER — PROPOFOL 10 MG/ML IV BOLUS
INTRAVENOUS | Status: AC
  Filled 2023-12-12: qty 20

## 2023-12-12 MED ORDER — PROPOFOL 10 MG/ML IV BOLUS
INTRAVENOUS | Status: DC | PRN
  Administered 2023-12-12: 140 mg via INTRAVENOUS

## 2023-12-12 MED ORDER — MIDAZOLAM HCL 2 MG/2ML IJ SOLN
INTRAMUSCULAR | Status: DC | PRN
  Administered 2023-12-12: 2 mg via INTRAVENOUS

## 2023-12-12 MED ORDER — FENTANYL CITRATE (PF) 250 MCG/5ML IJ SOLN
INTRAMUSCULAR | Status: AC
  Filled 2023-12-12: qty 5

## 2023-12-12 MED ORDER — OXYCODONE HCL 5 MG PO TABS
ORAL_TABLET | ORAL | Status: AC
  Filled 2023-12-12: qty 1

## 2023-12-12 MED ORDER — OXYCODONE HCL 5 MG PO TABS
5.0000 mg | ORAL_TABLET | Freq: Once | ORAL | Status: AC | PRN
  Administered 2023-12-12: 5 mg via ORAL

## 2023-12-12 MED ORDER — FENTANYL CITRATE (PF) 250 MCG/5ML IJ SOLN
INTRAMUSCULAR | Status: DC | PRN
  Administered 2023-12-12 (×2): 25 ug via INTRAVENOUS
  Administered 2023-12-12: 50 ug via INTRAVENOUS
  Administered 2023-12-12: 100 ug via INTRAVENOUS
  Administered 2023-12-12: 50 ug via INTRAVENOUS

## 2023-12-12 MED ORDER — ONDANSETRON HCL 4 MG/2ML IJ SOLN
INTRAMUSCULAR | Status: DC | PRN
  Administered 2023-12-12: 4 mg via INTRAVENOUS

## 2023-12-12 MED ORDER — DROPERIDOL 2.5 MG/ML IJ SOLN: 0.6250 mg | Freq: Once | INTRAMUSCULAR | Status: DC | PRN

## 2023-12-12 MED ORDER — BUPIVACAINE-EPINEPHRINE (PF) 0.25% -1:200000 IJ SOLN
INTRAMUSCULAR | Status: AC
  Filled 2023-12-12: qty 30

## 2023-12-12 MED ORDER — TRAMADOL HCL 50 MG PO TABS: 50.0000 mg | ORAL_TABLET | ORAL | 0 refills | Status: DC | PRN

## 2023-12-12 MED ORDER — FENTANYL CITRATE (PF) 100 MCG/2ML IJ SOLN
25.0000 ug | INTRAMUSCULAR | Status: DC | PRN
  Administered 2023-12-12: 50 ug via INTRAVENOUS

## 2023-12-12 SURGICAL SUPPLY — 46 items
BAG COUNTER SPONGE SURGICOUNT (BAG) ×1 IMPLANT
BLADE SURG 15 STRL LF DISP TIS (BLADE) ×1 IMPLANT
CEMENT KYPHON C01A KIT/MIXER (Cement) IMPLANT
COVER MAYO STAND STRL (DRAPES) ×1 IMPLANT
COVER SURGICAL LIGHT HANDLE (MISCELLANEOUS) ×1 IMPLANT
CURETTE EXPRESS SZ2 7MM (INSTRUMENTS) IMPLANT
DEVICE BIOPSY BONE KYPHX (INSTRUMENTS) IMPLANT
DRAPE C-ARM 42X72 X-RAY (DRAPES) ×1 IMPLANT
DRAPE HALF SHEET 40X57 (DRAPES) IMPLANT
DRAPE INCISE IOBAN 66X45 STRL (DRAPES) ×1 IMPLANT
DRAPE LAPAROTOMY T 102X78X121 (DRAPES) ×1 IMPLANT
DRAPE SURG 17X23 STRL (DRAPES) ×4 IMPLANT
DRAPE WARM FLUID 44X44 (DRAPES) ×1 IMPLANT
DURAPREP 26ML APPLICATOR (WOUND CARE) ×1 IMPLANT
GAUZE 4X4 16PLY ~~LOC~~+RFID DBL (SPONGE) ×1 IMPLANT
GAUZE SPONGE 2X2 8PLY STRL LF (GAUZE/BANDAGES/DRESSINGS) ×1 IMPLANT
GAUZE SPONGE 2X2 STRL 8-PLY (GAUZE/BANDAGES/DRESSINGS) IMPLANT
GLOVE BIO SURGEON STRL SZ 6.5 (GLOVE) ×1 IMPLANT
GLOVE BIO SURGEON STRL SZ8 (GLOVE) ×1 IMPLANT
GLOVE BIOGEL PI IND STRL 7.0 (GLOVE) ×1 IMPLANT
GLOVE BIOGEL PI IND STRL 8 (GLOVE) ×1 IMPLANT
GLOVE SURG ENC MOIS LTX SZ6.5 (GLOVE) ×1 IMPLANT
GOWN STRL REUS W/ TWL LRG LVL3 (GOWN DISPOSABLE) ×2 IMPLANT
GOWN STRL REUS W/ TWL XL LVL3 (GOWN DISPOSABLE) ×1 IMPLANT
KIT BASIN OR (CUSTOM PROCEDURE TRAY) ×1 IMPLANT
KIT OSTEOCOOL BONE ACCESS 8G (MISCELLANEOUS) IMPLANT
KIT OSTEOCOOL DUAL PROBE 15 (MISCELLANEOUS) IMPLANT
KIT TURNOVER KIT B (KITS) ×1 IMPLANT
NDL 22X1.5 STRL (OR ONLY) (MISCELLANEOUS) IMPLANT
NDL HYPO 25X1 1.5 SAFETY (NEEDLE) ×1 IMPLANT
NDL SPNL 18GX3.5 QUINCKE PK (NEEDLE) ×2 IMPLANT
NEEDLE 22X1.5 STRL (OR ONLY) (MISCELLANEOUS) IMPLANT
NEEDLE HYPO 25X1 1.5 SAFETY (NEEDLE) ×1 IMPLANT
NEEDLE SPNL 18GX3.5 QUINCKE PK (NEEDLE) ×2 IMPLANT
NS IRRIG 1000ML POUR BTL (IV SOLUTION) ×1 IMPLANT
PACK UNIVERSAL I (CUSTOM PROCEDURE TRAY) ×1 IMPLANT
PAD ARMBOARD POSITIONER FOAM (MISCELLANEOUS) ×2 IMPLANT
POSITIONER HEAD PRONE TRACH (MISCELLANEOUS) ×1 IMPLANT
SUT MNCRL AB 4-0 PS2 18 (SUTURE) ×1 IMPLANT
SYR BULB IRRIG 60ML STRL (SYRINGE) ×1 IMPLANT
SYR CONTROL 10ML LL (SYRINGE) ×1 IMPLANT
TAPE CLOTH 4X10 WHT NS (GAUZE/BANDAGES/DRESSINGS) IMPLANT
TOWEL GREEN STERILE (TOWEL DISPOSABLE) ×1 IMPLANT
TOWEL GREEN STERILE FF (TOWEL DISPOSABLE) ×1 IMPLANT
TRAY KYPHOPAK 15/3 ONESTEP 1ST (MISCELLANEOUS) IMPLANT
TRAY KYPHOPAK 20/3 ONESTEP 1ST (MISCELLANEOUS) IMPLANT

## 2023-12-12 NOTE — H&P (Signed)
 PREOPERATIVE H&P  Chief Complaint: Low back pain  HPI: Lance [name] is a 57 y.o. male who did present to me with severe pain in his low back.  He was noted to have lesions in various vertebral bodies in both his thoracic and lumbar spine.  A fracture was identified at L5, with a significant lesion throughout the majority of the L5 vertebral body.  He did present to me due to this.  We did discuss proceeding with a biopsy of his L5 vertebral body, with kyphoplasty, and ablation of his L5 lesion.  He did wish to proceed.   Patient has failed multiple forms of conservative care and continues to have pain (see office notes for additional details regarding the patient's full course of treatment)  Past Medical History:  Diagnosis Date   Graves disease    dx'd 01/2016   History of kidney stones    Hyperthyroidism    Hypothyroid    Leukopenia    Neutropenia (HCC)    Neutropenic fever (HCC)    Pneumonia ~ 2015 X 1   Past Surgical History:  Procedure Laterality Date   CLAVICLE SURGERY Left 1980s   has plates and pins   FRACTURE SURGERY     I & D EXTREMITY Right 11/04/2016   Procedure: IRRIGATION AND DEBRIDEMENT INDEX FINGER AND PINNING;  Surgeon: Shari Easter, MD;  Location: [facility] OR;  Service: Orthopedics;  Laterality: Right;   LITHOTRIPSY  1990s   Social History   Socioeconomic History   Marital status: Married    Spouse name: Not on file   Number of children: Not on file   Years of education: Not on file   Highest education level: Not on file  Occupational History   Not on file  Tobacco Use   Smoking status: Former    Current packs/day: 0.00    Average packs/day: 0.5 packs/day for 20.0 years (10.0 ttl pk-yrs)    Types: Cigarettes    Start date: 09/20/1994    Quit date: 09/20/2014    Years since quitting: 9.2    Passive exposure: Never   Smokeless tobacco: Former    Types: Chew   Tobacco comments:    stopped chewing the 1990s. Quit smoking cigarettes in 2015.  Vaping  Use   Vaping status: Never Used  Substance and Sexual Activity   Alcohol  use: No    Alcohol /week: 0.0 standard drinks of alcohol    Drug use: No   Sexual activity: Yes  Other Topics Concern   Not on file  Social History Narrative   Not on file   Social Drivers of Health   Financial Resource Strain: Not on file  Food Insecurity: No Food Insecurity (11/21/2023)   Hunger Vital Sign    Worried About Running Out of Food in the Last Year: Never true    Ran Out of Food in the Last Year: Never true  Transportation Needs: No Transportation Needs (11/21/2023)   PRAPARE - Administrator, Civil Service (Medical): No    Lack of Transportation (Non-Medical): No  Physical Activity: Not on file  Stress: Not on file  Social Connections: Not on file   Family History  Problem Relation Age of Onset   Sleep apnea Mother    Bladder Cancer Father    Hypertension Father    Colon cancer Neg Hx    Esophageal cancer Neg Hx    Prostate cancer Neg Hx    Rectal cancer Neg Hx  Stomach cancer Neg Hx    Thyroid  disease Neg Hx    Migraines Neg Hx    Headache Neg Hx    Allergies  Allergen Reactions   Methimazole  Other (See Comments)    WBC drops   Methocarbamol Other (See Comments)    WBC drops   Prior to Admission medications   Medication Sig Start Date End Date Taking? Authorizing Provider  ALPRAZolam (XANAX) 0.5 MG tablet Take 0.5 mg by mouth 2 (two) times daily as needed for anxiety. 11/20/23  Yes [provider]  levothyroxine (SYNTHROID) 100 MCG tablet Take 100 mcg by mouth daily. 07/28/21  Yes [provider]  traMADol  (ULTRAM ) 50 MG tablet Take 1 tablet (50 mg total) by mouth every 4 (four) hours as needed. You may be able to take 1-2, if needed, for pain. Patient taking differently: Take 100 mg by mouth 3 (three) times daily as needed (back pain). 11/21/23  Yes Timmy Maude SAUNDERS, MD  zolpidem  (AMBIEN ) 10 MG tablet Take 10 mg by mouth at bedtime.   Yes [provider]  ibuprofen  (ADVIL ) 800 MG tablet Take 1 tablet (800 mg total) by mouth 3 (three) times daily. 11/19/23   Levander Houston, MD     All other systems have been reviewed and were otherwise negative with the exception of those mentioned in the HPI and as above.  Physical Exam: Vitals:   12/12/23 0759  BP: (!) 126/94  Pulse: 89  Resp: 18  Temp: (!) 97.5 F (36.4 C)  SpO2: 97%    Body mass index is 23.72 kg/m.  General: Alert, no acute distress Cardiovascular: No pedal edema Respiratory: No cyanosis, no use of accessory musculature Skin: No lesions in the area of chief complaint Neurologic: Sensation intact distally Psychiatric: Patient is competent for consent with normal mood and affect Lymphatic: No axillary or cervical lymphadenopathy   Assessment/Plan:  Plan for Procedure(s): KYPHOPLASTY at L5, with biopsy of L5 vertebral body lesion, with ablation of L5 vertebral body lesion   Oneil LITTIE Priestly, MD 12/12/2023 9:38 AM

## 2023-12-12 NOTE — Transfer of Care (Signed)
 Immediate Anesthesia Transfer of Care Note  Patient: Lance [name]  Procedure(s) Performed: LUMBAR FIVE KYPHOPLASTY WITH LUMBAR FIVE LESION ABLASION (Spine Lumbar)  Patient Location: PACU  Anesthesia Type:General  Level of Consciousness: awake and alert   Airway & Oxygen Therapy: Patient Spontanous Breathing  Post-op Assessment: Report given to RN and Post -op Vital signs reviewed and stable  Post vital signs: Reviewed and stable  Last Vitals:  Vitals Value Taken Time  BP 142/87 12/12/23 13:31  Temp 97.7   Pulse 101 12/12/23 13:32  Resp 15   SpO2 98 % 12/12/23 13:32  Vitals shown include unfiled device data.  Last Pain:  Vitals:   12/12/23 0818  TempSrc:   PainSc: 0-No pain         Complications: No notable events documented.

## 2023-12-12 NOTE — Anesthesia Preprocedure Evaluation (Addendum)
 Anesthesia Evaluation  Patient identified by MRN, date of birth, ID band Patient awake    Reviewed: Allergy & Precautions, H&P , NPO status , Patient's Chart, lab work & pertinent test results  Airway Mallampati: II  TM Distance: >3 FB Neck ROM: Full    Dental no notable dental hx.    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 Complete resolution of WBC counts following discontinuation.    Anesthesia Other Findings   Reproductive/Obstetrics negative OB ROS                              Anesthesia Physical Anesthesia Plan  ASA: 3  Anesthesia Plan: General   Post-op Pain Management: Tylenol  PO (pre-op)*   Induction: Intravenous  PONV Risk Score and Plan: 2 and Ondansetron , Dexamethasone , Treatment may vary due to age or medical condition and Midazolam   Airway Management Planned: Oral ETT  Additional Equipment: None  Intra-op Plan:   Post-operative Plan: Extubation in OR  Informed Consent: I have reviewed the patients History and Physical, chart, labs and discussed the procedure including the risks, benefits and alternatives for the proposed anesthesia with the patient or authorized representative who has indicated his/her understanding and acceptance.     Dental advisory given  Plan Discussed with:  CRNA  Anesthesia Plan Comments:          Anesthesia Quick Evaluation

## 2023-12-12 NOTE — Progress Notes (Addendum)
 Patient completed kyphoplasty today. Will follow for path, however patient will also have a bronch next week for tissue sampling.   PET also reviewed. No new areas of hypermetabolism seen.   Oncology Nurse Navigator Documentation     12/12/2023    1:00 PM  Oncology Nurse Navigator Flowsheets  Navigator Follow Up Date: 12/18/2023  Navigator Follow Up Reason: Other:  Navigator Location [facility]  Navigator Encounter Type Appt/Treatment Plan Review  Treatment Phase Abnormal Scans  Barriers/Navigation Needs Coordination of Care;Education  Interventions None Required  Acuity Level 2-Minimal Needs (1-2 Barriers Identified)  Support Groups/Services Friends and Family  Time Spent with Patient 15

## 2023-12-12 NOTE — Anesthesia Postprocedure Evaluation (Signed)
 Anesthesia Post Note  Patient: Lance [name]  Procedure(s) Performed: LUMBAR FIVE KYPHOPLASTY WITH LUMBAR FIVE LESION ABLASION (Spine Lumbar)     Patient location during evaluation: PACU Anesthesia Type: General Level of consciousness: awake and alert Pain management: pain level controlled Vital Signs Assessment: post-procedure vital signs reviewed and stable Respiratory status: spontaneous breathing, nonlabored ventilation, respiratory function stable and patient connected to nasal cannula oxygen Cardiovascular status: blood pressure returned to baseline and stable Postop Assessment: no apparent nausea or vomiting Anesthetic complications: no   No notable events documented.  Last Vitals:  Vitals:   12/12/23 1415 12/12/23 1420  BP: (!) 129/90 133/81  Pulse: 86 87  Resp: 12 (!) 21  Temp:  36.5 C  SpO2: 94% 92%    Last Pain:  Vitals:   12/12/23 1420  TempSrc:   PainSc: 3                  Thom JONELLE Peoples

## 2023-12-12 NOTE — Op Note (Addendum)
 PATIENT NAME: Lance [name]   MEDICAL RECORD NO.:   [id number]    DATE OF BIRTH: 21-Feb-1966   DATE OF PROCEDURE: 12/12/2023                              OPERATIVE REPORT   PREOPERATIVE DIAGNOSIS:    Osteoporosis with pathologic L5 compression fracture L5 vertebral body lesion of  unknown origin   POSTOPERATIVE DIAGNOSIS:   1. Osteoporosis with pathologic L5 compression fracture 2. L5 vertebral body lesion of  unknown origin   PROCEDURE:    L5 kyphoplasty L5 vertebral body biopsy Ablation procedure to L5 vertebral body lesion   SURGEON:  Oneil Priestly, MD.  ASSISTANTBETHA Ileana Clara, PA-C  ANESTHESIA:  General endotracheal anesthesia.  COMPLICATIONS:  None.  DISPOSITION:  Stable.  ESTIMATED BLOOD LOSS:  Minimal.  INDICATIONS FOR SURGERY:  Briefly, Lance [name] is a very pleasant 58 y.o.- year-old male, who did present to me with severe pain in his low back.  His workup did reveal lesions of unknown origin in the L5 vertebral body, and another vertebral bodies, as reflected in his medical records.  Given his severe pain, we did discuss proceeding with a kyphoplasty procedure to L5, with an ablation procedure of the lesion involving the L5 vertebral body, in addition to a biopsy of the lesion.  He did understand that the procedure was intended to address his quite severe low back pain, but he did understand there was no guarantee of improvement in his back pain.  After full understanding of the risks and limitations of surgery, he did elect to proceed.  OPERATIVE DETAILS:  On 12/12/2023, the patient was brought to surgery and general endotracheal anesthesia was administered.  The patient was placed prone on a well-padded flat Jackson bed with gel rolls placed under the patient's chest and hips.  Antibiotics were given.  AP and lateral fluoroscopy was brought into the field.  The L5 pedicles were marked out in the usual fashion.  After a time-out procedure was performed, I did  advance Jamshidis across the L5 pedicles on the right and left sides.  I then drilled through the Jamshidis. I then perfromed a biopsy procedure, and was able to withdraw small amounts of soft tissue from the right cannula, which was the side that was most affected by the soft tissue lesion. This tissue was sent to pathology, labeled L5 vertebral body.  At this point, I inserted the ablation probes through the right and left cannulas.  The ablation procedure was then performed, which lasted 8 minutes in total.  The probes were then removed. I then inserted kyphoplasty balloons and I was able to inflate the balloons with approximately 4cc of contrast on each side.  The balloons were deflated then removed.  At this point, after the cement was mixed, a total of approximately 10 cc of cement was injected through the right and left cannulas.  More cement volume was inserted through the right cannula, as this was incised with the larger lesion.  The cement did extend into the upper and lower endplates, and also extended both anteriorly and posteriorly.  There is no extravasation of cement anterior to the vertebral body, or posterior to the spinal canal.  There was some minimal cement extravasation through the lower endplate, which was not unexpected based on the fracture fragments noted on the CT and MRI. The cement was then allowed to harden, after which point  the Jamshidis were removed.  The wound was then irrigated and closed using 4-0 Monocryl.  Bacitracin  and a sterile dressing were applied.  The patient was then awoken from general endotracheal anesthesia and transferred to recovery in stable condition.  Oneil Priestly, MD

## 2023-12-12 NOTE — Anesthesia Procedure Notes (Signed)
 Procedure Name: Intubation Date/Time: 12/12/2023 12:04 PM  Performed by: Atanacio Arland HERO, CRNAPre-anesthesia Checklist: Patient identified, Emergency Drugs available, Suction available, Patient being monitored and Timeout performed Patient Re-evaluated:Patient Re-evaluated prior to induction Oxygen Delivery Method: Circle system utilized Preoxygenation: Pre-oxygenation with 100% oxygen Induction Type: IV induction Ventilation: Oral airway inserted - appropriate to patient size and Two handed mask ventilation required Laryngoscope Size: Miller and 3 Grade View: Grade II Tube type: Oral Tube size: 7.5 mm Number of attempts: 1 Placement Confirmation: positive ETCO2, breath sounds checked- equal and bilateral and ETT inserted through vocal cords under direct vision Secured at: 23 cm Tube secured with: Tape Dental Injury: Teeth and Oropharynx as per pre-operative assessment  Comments: BURP maneuver

## 2023-12-13 ENCOUNTER — Encounter (HOSPITAL_COMMUNITY): Payer: Self-pay | Admitting: Orthopedic Surgery

## 2023-12-17 ENCOUNTER — Encounter
Admission: RE | Admit: 2023-12-17 | Discharge: 2023-12-17 | Disposition: A | Discharge status: Home / Self Care | Source: Ambulatory Visit | Attending: Student in an Organized Health Care Education/Training Program | Admitting: Student in an Organized Health Care Education/Training Program

## 2023-12-17 ENCOUNTER — Other Ambulatory Visit: Payer: Self-pay

## 2023-12-17 HISTORY — DX: Anxiety disorder, unspecified: F41.9

## 2023-12-17 HISTORY — DX: Localized enlarged lymph nodes: R59.0

## 2023-12-17 NOTE — Patient Instructions (Addendum)
 Your procedure is scheduled on: Tuesday 12/18/23  Report to the Registration Desk on the 1st floor of the [facility]. To find out your arrival time, please call [phone] between 1PM - 3PM on: Monday 12/17/23  If your arrival time is 6:00 am, do not arrive before that time as the [facility] entrance doors do not open until 6:00 am.  REMEMBER: Instructions that are not followed completely may result in serious medical risk, up to and including death; or upon the discretion of your surgeon and anesthesiologist your surgery may need to be rescheduled.  Do not eat food or drink fluids after midnight the night before surgery.  No gum chewing or hard candies.   One week prior to surgery: Stop Anti-inflammatories (NSAIDS) such as Advil , Aleve, Ibuprofen , Motrin , Naproxen, Naprosyn and Aspirin based products such as Excedrin, Goody's Powder, BC Powder. Stop ANY OVER THE COUNTER supplements until after surgery.  You may however, continue to take Tylenol  if needed for pain up until the day of surgery.   Continue taking all of your other prescription medications up until the day of surgery.  ON THE DAY OF SURGERY ONLY TAKE THESE MEDICATIONS WITH SIPS OF WATER:  levothyroxine (SYNTHROID) 100 MCG  ALPRAZolam (XANAX) 0.5 MG (if needed)  No Alcohol  for 24 hours before or after surgery.  No Smoking including e-cigarettes for 24 hours before surgery.  No chewable tobacco products for at least 6 hours before surgery.  No nicotine patches on the day of surgery.  Do not use any recreational drugs for at least a week (preferably 2 weeks) before your surgery.  Please be advised that the combination of cocaine and anesthesia may have negative outcomes, up to and including death. If you test positive for cocaine, your surgery will be cancelled.  On the morning of surgery brush your teeth with toothpaste and water, you may rinse your mouth with mouthwash if you wish. Do not swallow any  toothpaste or mouthwash.  Take a fresh shower the morning the morning of your procedure.   Do not wear jewelry, make-up, hairpins, clips or nail polish.  For welded (permanent) jewelry: bracelets, anklets, waist bands, etc.  Please have this removed prior to surgery.  If it is not removed, there is a chance that hospital personnel will need to cut it off on the day of surgery.  Do not wear lotions, powders, or perfumes.   Do not shave body hair from the neck down 48 hours before surgery.  Contact lenses, hearing aids and dentures may not be worn into surgery.  Do not bring valuables to the hospital. [facility] is not responsible for any missing/lost belongings or valuables.   Total Shoulder Arthroplasty:  use Benzoyl Peroxide 5% Gel as directed on instruction sheet.  Bring your C-PAP to the hospital in case you may have to spend the night.   Notify your doctor if there is any change in your medical condition (cold, fever, infection).  Wear comfortable clothing (specific to your surgery type) to the hospital.  After surgery, you can help prevent lung complications by doing breathing exercises.  Take deep breaths and cough every 1-2 hours. Your doctor may order a device called an Incentive Spirometer to help you take deep breaths. When coughing or sneezing, hold a pillow firmly against your incision with both hands. This is called "splinting." Doing this helps protect your incision. It also decreases belly discomfort.  If you are being admitted to the hospital overnight, leave your  suitcase in the car. After surgery it may be brought to your room.  In case of increased patient census, it may be necessary for you, the patient, to continue your postoperative care in the Same Day Surgery department.  If you are being discharged the day of surgery, you will not be allowed to drive home. You will need a responsible individual to drive you home and stay with you for 24 hours after surgery.    If you are taking public transportation, you will need to have a responsible individual with you.  Please call the Pre-admissions Testing Dept. at [phone] if you have any questions about these instructions.  Surgery Visitation Policy:  Patients having surgery or a procedure may have two visitors.  Children under the age of 50 must have an adult with them who is not the patient.  Inpatient Visitation:    Visiting hours are 7 a.m. to 8 p.m. Up to four visitors are allowed at one time in a patient room. The visitors may rotate out with other people during the day.  One visitor age 17 or older may stay with the patient overnight and must be in the room by 8 p.m.   Merchandiser, retail to address health-related social needs:  [email or web address]

## 2023-12-18 ENCOUNTER — Ambulatory Visit: Admitting: Anesthesiology

## 2023-12-18 ENCOUNTER — Encounter: Payer: Self-pay | Admitting: Student in an Organized Health Care Education/Training Program

## 2023-12-18 ENCOUNTER — Ambulatory Visit

## 2023-12-18 ENCOUNTER — Ambulatory Visit
Admission: RE | Admit: 2023-12-18 | Discharge: 2023-12-18 | Disposition: A | Discharge status: Home / Self Care | Attending: Student in an Organized Health Care Education/Training Program | Admitting: Student in an Organized Health Care Education/Training Program

## 2023-12-18 ENCOUNTER — Encounter
Admission: RE | Disposition: A | Discharge status: Home / Self Care | Payer: Self-pay | Source: Home / Self Care | Attending: Student in an Organized Health Care Education/Training Program

## 2023-12-18 ENCOUNTER — Other Ambulatory Visit: Payer: Self-pay

## 2023-12-18 DIAGNOSIS — E039 Hypothyroidism, unspecified: Secondary | ICD-10-CM | POA: Insufficient documentation

## 2023-12-18 DIAGNOSIS — R590 Localized enlarged lymph nodes: Secondary | ICD-10-CM | POA: Diagnosis not present

## 2023-12-18 DIAGNOSIS — Z1211 Encounter for screening for malignant neoplasm of colon: Secondary | ICD-10-CM | POA: Diagnosis not present

## 2023-12-18 DIAGNOSIS — F419 Anxiety disorder, unspecified: Secondary | ICD-10-CM | POA: Diagnosis not present

## 2023-12-18 DIAGNOSIS — Z79890 Hormone replacement therapy (postmenopausal): Secondary | ICD-10-CM | POA: Insufficient documentation

## 2023-12-18 DIAGNOSIS — Z87891 Personal history of nicotine dependence: Secondary | ICD-10-CM | POA: Diagnosis not present

## 2023-12-18 DIAGNOSIS — Z48813 Encounter for surgical aftercare following surgery on the respiratory system: Secondary | ICD-10-CM | POA: Diagnosis not present

## 2023-12-18 DIAGNOSIS — M549 Dorsalgia, unspecified: Secondary | ICD-10-CM | POA: Diagnosis not present

## 2023-12-18 DIAGNOSIS — Z79899 Other long term (current) drug therapy: Secondary | ICD-10-CM | POA: Diagnosis not present

## 2023-12-18 DIAGNOSIS — R0989 Other specified symptoms and signs involving the circulatory and respiratory systems: Secondary | ICD-10-CM | POA: Diagnosis not present

## 2023-12-18 HISTORY — PX: ENDOBRONCHIAL ULTRASOUND: SHX5096

## 2023-12-18 LAB — BODY FLUID CELL COUNT WITH DIFFERENTIAL
Eos, Fluid: 0 %
Lymphs, Fluid: 30 %
Monocyte-Macrophage-Serous Fluid: 60 % (ref 50–90)
Neutrophil Count, Fluid: 10 % (ref 0–25)
Total Nucleated Cell Count, Fluid: 23 uL (ref 0–1000)

## 2023-12-18 LAB — SURGICAL PATHOLOGY

## 2023-12-18 SURGERY — ENDOBRONCHIAL ULTRASOUND (EBUS)
Anesthesia: General | Laterality: Bilateral

## 2023-12-18 MED ORDER — ONDANSETRON HCL 4 MG/2ML IJ SOLN
INTRAMUSCULAR | Status: DC | PRN
  Administered 2023-12-18: 4 mg via INTRAVENOUS

## 2023-12-18 MED ORDER — OXYCODONE HCL 5 MG PO TABS: 5.0000 mg | ORAL_TABLET | Freq: Once | ORAL | Status: DC | PRN

## 2023-12-18 MED ORDER — DEXAMETHASONE SODIUM PHOSPHATE 10 MG/ML IJ SOLN
INTRAMUSCULAR | Status: DC | PRN
  Administered 2023-12-18: 8 mg via INTRAVENOUS

## 2023-12-18 MED ORDER — CHLORHEXIDINE GLUCONATE 0.12 % MT SOLN
OROMUCOSAL | Status: AC
  Filled 2023-12-18: qty 15

## 2023-12-18 MED ORDER — SUGAMMADEX SODIUM 200 MG/2ML IV SOLN
INTRAVENOUS | Status: DC | PRN
  Administered 2023-12-18: 200 mg via INTRAVENOUS

## 2023-12-18 MED ORDER — PROPOFOL 10 MG/ML IV BOLUS
INTRAVENOUS | Status: AC
  Filled 2023-12-18: qty 80

## 2023-12-18 MED ORDER — MIDAZOLAM HCL 2 MG/2ML IJ SOLN
INTRAMUSCULAR | Status: AC
Start: 2023-12-18 — End: 2023-12-18
  Filled 2023-12-18: qty 2

## 2023-12-18 MED ORDER — FENTANYL CITRATE (PF) 100 MCG/2ML IJ SOLN: 25.0000 ug | INTRAMUSCULAR | Status: DC | PRN

## 2023-12-18 MED ORDER — ROCURONIUM BROMIDE 100 MG/10ML IV SOLN
INTRAVENOUS | Status: DC | PRN
  Administered 2023-12-18: 50 mg via INTRAVENOUS

## 2023-12-18 MED ORDER — ACETAMINOPHEN 10 MG/ML IV SOLN: 1000.0000 mg | Freq: Once | INTRAVENOUS | Status: DC | PRN

## 2023-12-18 MED ORDER — CHLORHEXIDINE GLUCONATE 0.12 % MT SOLN
15.0000 mL | Freq: Once | OROMUCOSAL | Status: AC
  Administered 2023-12-18: 15 mL via OROMUCOSAL

## 2023-12-18 MED ORDER — LIDOCAINE HCL (CARDIAC) PF 100 MG/5ML IV SOSY
PREFILLED_SYRINGE | INTRAVENOUS | Status: DC | PRN
  Administered 2023-12-18: 80 mg via INTRAVENOUS

## 2023-12-18 MED ORDER — PROPOFOL 10 MG/ML IV BOLUS
INTRAVENOUS | Status: DC | PRN
  Administered 2023-12-18: 150 ug/kg/min via INTRAVENOUS
  Administered 2023-12-18: 150 mg via INTRAVENOUS

## 2023-12-18 MED ORDER — ONDANSETRON HCL 4 MG/2ML IJ SOLN: 4.0000 mg | Freq: Once | INTRAMUSCULAR | Status: DC | PRN

## 2023-12-18 MED ORDER — FENTANYL CITRATE (PF) 100 MCG/2ML IJ SOLN
INTRAMUSCULAR | Status: AC
Start: 2023-12-18 — End: 2023-12-18
  Filled 2023-12-18: qty 2

## 2023-12-18 MED ORDER — PHENYLEPHRINE HCL-NACL 20-0.9 MG/250ML-% IV SOLN
INTRAVENOUS | Status: AC
  Filled 2023-12-18: qty 250

## 2023-12-18 MED ORDER — PROPOFOL 1000 MG/100ML IV EMUL
INTRAVENOUS | Status: AC
  Filled 2023-12-18: qty 100

## 2023-12-18 MED ORDER — ORAL CARE MOUTH RINSE: 15.0000 mL | Freq: Once | OROMUCOSAL | Status: AC

## 2023-12-18 MED ORDER — MIDAZOLAM HCL 2 MG/2ML IJ SOLN
INTRAMUSCULAR | Status: DC | PRN
  Administered 2023-12-18: 2 mg via INTRAVENOUS

## 2023-12-18 MED ORDER — FENTANYL CITRATE (PF) 100 MCG/2ML IJ SOLN
INTRAMUSCULAR | Status: DC | PRN
  Administered 2023-12-18 (×2): 50 ug via INTRAVENOUS

## 2023-12-18 MED ORDER — LACTATED RINGERS IV SOLN: INTRAVENOUS | Status: DC

## 2023-12-18 MED ORDER — OXYCODONE HCL 5 MG/5ML PO SOLN: 5.0000 mg | Freq: Once | ORAL | Status: DC | PRN

## 2023-12-18 NOTE — Transfer of Care (Signed)
 Immediate Anesthesia Transfer of Care Note  Patient: Lance [name]  Procedure(s) Performed: ENDOBRONCHIAL ULTRASOUND (EBUS) (Bilateral)  Patient Location: PACU  Anesthesia Type:General  Level of Consciousness: awake, drowsy, and patient cooperative  Airway & Oxygen Therapy: Patient Spontanous Breathing  Post-op Assessment: Report given to RN, Post -op Vital signs reviewed and stable, and Patient moving all extremities  Post vital signs: Reviewed and stable  Last Vitals:  Vitals Value Taken Time  BP 111/81 12/18/23 09:18  Temp 36.2 C 12/18/23 09:18  Pulse 104 12/18/23 09:24  Resp 15 12/18/23 09:24  SpO2 94 % 12/18/23 09:24  Vitals shown include unfiled device data.  Last Pain:  Vitals:   12/18/23 0639  TempSrc: Temporal  PainSc: 0-No pain         Complications: No notable events documented.

## 2023-12-18 NOTE — Op Note (Signed)
 Flexible and EBUS Bronchoscopy Procedure Note  Lance [name]  [id number]  07/20/1965  Date:12/18/23  Time:9:08 AM   Provider Performing:Christophor Eick   Procedure: Flexible bronchoscopy and EBUS Bronchoscopy  Indication(s) Mediastinal lymphadenopathy  Consent Risks of the procedure as well as the alternatives and risks of each were explained to the patient and/or caregiver.  Consent for the procedure was obtained.  Anesthesia General Anesthesia   Time Out Verified patient identification, verified procedure, site/side was marked, verified correct patient position, special equipment/implants available, medications/allergies/relevant history reviewed, required imaging and test results available.   Sterile Technique Usual hand hygiene, masks, gowns, and gloves were used   Procedure Description Diagnostic bronchoscope advanced through endotracheal tube and into airway.  Airways were examined down to subsegmental level with findings noted below. The diagnostic bronchoscope was then removed and the EBUS bronchoscope was advanced into airway with stations 4R biopsied with a 21G Olympus ViziShot 2 needle and sent for slide, cell block, flow cytometry and culture.  The EBUS bronchoscope was removed after assuring no active bleeding from biopsy site. We then reintroduced the flexible bronchoscope and a BAL was performed in the RML (RB5). 90 mL of saline pushed in, 50 mL taken back and sent for culture and flow cytometry.  Findings:   Airway Survey within normal, no endobronchial lesions. EBUS to station 4R. BAL in RML (RB5)  Main Carina    RUL    RML    RLL    LUL    Lingula    LLL    EBUS to station 4R    Complications/Tolerance None; patient tolerated the procedure well. Chest X-ray is needed post procedure.   EBL Minimal   Specimen(s) 4R for cytology, flow cytometry, and culture BAL for flow cytometry and culture  [name], MD [place]  [facility] 12/18/2023 9:16 AM

## 2023-12-18 NOTE — Anesthesia Postprocedure Evaluation (Signed)
 Anesthesia Post Note  Patient: Lance [name]  Procedure(s) Performed: ENDOBRONCHIAL ULTRASOUND (EBUS) (Bilateral)  Patient location during evaluation: PACU Anesthesia Type: General Level of consciousness: awake and alert, oriented and patient cooperative Pain management: pain level controlled Vital Signs Assessment: post-procedure vital signs reviewed and stable Respiratory status: spontaneous breathing, nonlabored ventilation and respiratory function stable Cardiovascular status: blood pressure returned to baseline and stable Postop Assessment: adequate PO intake Anesthetic complications: no   No notable events documented.   Last Vitals:  Vitals:   12/18/23 0941 12/18/23 0958  BP: 114/81 121/88  Pulse: 93 94  Resp: 19 17  Temp:  (!) 36.3 C  SpO2: 92% 98%    Last Pain:  Vitals:   12/18/23 0958  TempSrc: Temporal  PainSc: 0-No pain                 Svara Twyman

## 2023-12-18 NOTE — Interval H&P Note (Signed)
 S: feels well, no shortness of breath or chest pain. Tolerated kyphoplasty well last week. No path results are back yet.  O: Vitals:   12/18/23 0639  BP: 116/85  Pulse: (!) 105  Resp: 20  Temp: 97.9 F (36.6 C)  SpO2: 97%   General: Well-appearing and in no distress. Well-nourished HEENT: Mucous membranes moist Respiratory: Trachea is midline, no respiratory distress, good bilateral air entry, no wheezes, rales, or rhonchi Cardiovascular: Heart with regular rate and rhythm, normal S1 and S2, no murmurs, rubs, or gallops Gastrointestinal: Normoactive bowel sounds, soft and nontender Neuro: Alert and oriented, no gross focal deficits  A/P: Lance [name] is a pleasant 58 year old male with history of lymphadenopathy who presents for biopsy and tissue acquisition. Differential includes lymphoproliferative disorders vs other malignancy. Plan for EBUS with TBNA. Patient is appropriate for the procedure. Risks and benefits explained and he agrees to proceed.  [name], MD [place] [facility] 12/18/2023 8:18 AM

## 2023-12-18 NOTE — Anesthesia Procedure Notes (Signed)
 Procedure Name: Intubation Date/Time: 12/18/2023 8:29 AM  Performed by: Claudene Cornet, CRNAPre-anesthesia Checklist: Patient identified, Emergency Drugs available, Suction available and Patient being monitored Patient Re-evaluated:Patient Re-evaluated prior to induction Oxygen Delivery Method: Circle system utilized Preoxygenation: Pre-oxygenation with 100% oxygen Induction Type: IV induction Ventilation: Mask ventilation without difficulty Laryngoscope Size: 4 and McGrath Grade View: Grade I Tube type: Oral Tube size: 8.5 mm Number of attempts: 1 Airway Equipment and Method: Stylet and Video-laryngoscopy Placement Confirmation: ETT inserted through vocal cords under direct vision, positive ETCO2 and breath sounds checked- equal and bilateral Secured at: 22 cm Tube secured with: Tape Dental Injury: Teeth and Oropharynx as per pre-operative assessment

## 2023-12-18 NOTE — Anesthesia Preprocedure Evaluation (Addendum)
 Anesthesia Evaluation  Patient identified by MRN, date of birth, ID band Patient awake    Reviewed: Allergy & Precautions, NPO status , Patient's Chart, lab work & pertinent test results  History of Anesthesia Complications Negative for: history of anesthetic complications  Airway Mallampati: II   Neck ROM: Full    Dental  (+) Missing   Pulmonary former smoker (quit 2016)   Pulmonary exam normal breath sounds clear to auscultation       Cardiovascular Exercise Tolerance: Good negative cardio ROS Normal cardiovascular exam Rhythm:Regular Rate:Normal     Neuro/Psych  PSYCHIATRIC DISORDERS Anxiety     negative neurological ROS     GI/Hepatic negative GI ROS,,,  Endo/Other  Hypothyroidism    Renal/GU Renal disease (nephrolithiasis)     Musculoskeletal   Abdominal   Peds  Hematology negative hematology ROS (+)   Anesthesia Other Findings   Reproductive/Obstetrics                              Anesthesia Physical Anesthesia Plan  ASA: 2  Anesthesia Plan: General   Post-op Pain Management:    Induction: Intravenous  PONV Risk Score and Plan: 2 and Ondansetron , Dexamethasone  and Treatment may vary due to age or medical condition  Airway Management Planned: Oral ETT  Additional Equipment:   Intra-op Plan:   Post-operative Plan: Extubation in OR  Informed Consent: I have reviewed the patients History and Physical, chart, labs and discussed the procedure including the risks, benefits and alternatives for the proposed anesthesia with the patient or authorized representative who has indicated his/her understanding and acceptance.     Dental advisory given  Plan Discussed with: CRNA  Anesthesia Plan Comments: (Patient consented for risks of anesthesia including but not limited to:  - adverse reactions to medications - damage to eyes, teeth, lips or other oral mucosa - nerve  damage due to positioning  - sore throat or hoarseness - damage to heart, brain, nerves, lungs, other parts of body or loss of life  Informed patient about role of CRNA in peri- and intra-operative care.  Patient voiced understanding.)         Anesthesia Quick Evaluation

## 2023-12-19 ENCOUNTER — Encounter: Payer: Self-pay | Admitting: Student in an Organized Health Care Education/Training Program

## 2023-12-20 LAB — CULTURE, RESPIRATORY W GRAM STAIN
Culture: NO GROWTH
Culture: NO GROWTH
Gram Stain: NONE SEEN
Gram Stain: NONE SEEN

## 2023-12-20 LAB — ACID FAST SMEAR (AFB, MYCOBACTERIA)
Acid Fast Smear: NEGATIVE
Acid Fast Smear: NEGATIVE

## 2023-12-21 LAB — SURGICAL PATHOLOGY

## 2023-12-21 LAB — CYTOLOGY - NON PAP

## 2023-12-24 ENCOUNTER — Ambulatory Visit: Payer: Self-pay | Admitting: Student in an Organized Health Care Education/Training Program

## 2023-12-25 ENCOUNTER — Encounter: Payer: Self-pay | Admitting: *Deleted

## 2023-12-25 DIAGNOSIS — C801 Malignant (primary) neoplasm, unspecified: Secondary | ICD-10-CM

## 2023-12-25 NOTE — Progress Notes (Unsigned)
 Reviewed pathology from kyphoplasty and bronch. Neither are diagnostic for malignancy.  Spoke with Dr Timmy and received this response: we need to get him to Thoracic surgery for mediastinoscopy. I will call him   Dr Timmy spoke to patient. Referral placed.   Oncology Nurse Navigator Documentation     12/25/2023    3:00 PM  Oncology Nurse Navigator Flowsheets  Navigator Follow Up Date: 12/28/2023  Navigator Follow Up Reason: Other:  Navigator Location [facility]  Navigator Encounter Type Pathology Review  Treatment Phase Abnormal Scans  Barriers/Navigation Needs Coordination of Care;Education  Interventions Coordination of Care  Acuity Level 2-Minimal Needs (1-2 Barriers Identified)  Referrals Other  Coordination of Care Pathology;Other  Support Groups/Services Friends and Family  Time Spent with Patient 15

## 2023-12-26 ENCOUNTER — Encounter: Payer: Self-pay | Admitting: *Deleted

## 2023-12-26 NOTE — Progress Notes (Signed)
 Patient's wife calling with questions related to Dr Jessy phone call yesterday. Reviewed the mediastinoscopy procedure and the reason for it. Explained that a referral was placed and that Dr Timmy will speak to Dr Kerrin today. She knows that their office should reach out soon to schedule a consultation prior to procedure. She was appreciative of education.   Oncology Nurse Navigator Documentation     12/26/2023   11:30 AM  Oncology Nurse Navigator Flowsheets  Navigator Follow Up Date: 12/28/2023  Navigator Follow Up Reason: Other:  Navigator Location [facility]  Navigator Encounter Type Telephone  Telephone Incoming Call  Treatment Phase Abnormal Scans  Barriers/Navigation Needs Coordination of Film/video editor;Other  Interventions Education;Psycho-Social Support  Acuity Level 2-Minimal Needs (1-2 Barriers Identified)  Education Method Verbal  Support Groups/Services Friends and Family  Time Spent with Patient 15

## 2023-12-27 ENCOUNTER — Other Ambulatory Visit: Payer: Self-pay

## 2023-12-27 DIAGNOSIS — C801 Malignant (primary) neoplasm, unspecified: Secondary | ICD-10-CM

## 2023-12-28 ENCOUNTER — Encounter: Payer: Self-pay | Admitting: *Deleted

## 2023-12-28 NOTE — Progress Notes (Signed)
 Patient has an appointment with [facility] on 01/01/2024.  Oncology Nurse Navigator Documentation     12/28/2023    8:30 AM  Oncology Nurse Navigator Flowsheets  Navigator Follow Up Date: 01/01/2024  Navigator Follow Up Reason: Review Note  Navigator Location [facility]  Navigator Encounter Type Appt/Treatment Plan Review  Treatment Phase Abnormal Scans  Barriers/Navigation Needs Coordination of Care;Education  Interventions None Required  Acuity Level 2-Minimal Needs (1-2 Barriers Identified)  Support Groups/Services Friends and Family  Time Spent with Patient 15

## 2024-01-01 ENCOUNTER — Ambulatory Visit
Attending: Thoracic Surgery (Cardiothoracic Vascular Surgery) | Admitting: Thoracic Surgery (Cardiothoracic Vascular Surgery)

## 2024-01-01 ENCOUNTER — Encounter: Payer: Self-pay | Admitting: Thoracic Surgery (Cardiothoracic Vascular Surgery)

## 2024-01-01 ENCOUNTER — Other Ambulatory Visit: Payer: Self-pay | Admitting: *Deleted

## 2024-01-01 VITALS — BP 123/85 | HR 92 | Resp 20 | Ht 68.0 in | Wt 160.3 lb

## 2024-01-01 DIAGNOSIS — R590 Localized enlarged lymph nodes: Secondary | ICD-10-CM | POA: Diagnosis not present

## 2024-01-01 NOTE — Progress Notes (Signed)
 PCP is Larnell Hamilton, MD Referring Provider is Timmy Maude SAUNDERS, MD  Chief Complaint  Patient presents with   Mediastinal Lymphadenopathy    New patient consult, EBUS 7/29, PET 7/21, chest CT 7/3    HPI: Mr. Hirsch is seen for consultation regarding mediastinal adenopathy.  Duke Weisensel is a 58 year old man with a history of Graves' disease and remote tobacco abuse.  He presented with back pain.  He had an MRI which showed replacement of the marrow at L4 and L5.  There was a pathologic fracture of L5.  He underwent biopsy and kyphoplasty of L5 by Dr. Beuford, but pathology did not show any malignancy.  PET scan showed multiple spinal lesions.  There also was mediastinal adenopathy.  He had an EBUS done by Dr. Isadora that was also nondiagnostic.  He send loss of appetite.  He has lost about 10 to 15 pounds over the past 3 months.  No fevers, chills, sweats.  Denies chest pain or shortness of breath.  Does continue to have back pain.  Has about a 30-pack-year history of smoking prior to quitting 10 years ago  Zubrod Score: At the time of surgery this patient's most appropriate activity status/level should be described as: [x]     0    Normal activity, no symptoms []     1    Restricted in physical strenuous activity but ambulatory, able to do out light work []     2    Ambulatory and capable of self care, unable to do work activities, up and about >50 % of waking hours                              []     3    Only limited self care, in bed greater than 50% of waking hours []     4    Completely disabled, no self care, confined to bed or chair []     5    Moribund  Past Medical History:  Diagnosis Date   Anxiety    Graves disease    dx'd 01/2016   History of kidney stones    Hyperthyroidism    Hypothyroid    Leukopenia    Mediastinal lymphadenopathy    Neutropenia (HCC)    Neutropenic fever (HCC)    Pneumonia ~ 2015 X 1    Past Surgical History:  Procedure Laterality Date   CLAVICLE  SURGERY Left 1980s   has plates and pins   ENDOBRONCHIAL ULTRASOUND Bilateral 12/18/2023   Procedure: ENDOBRONCHIAL ULTRASOUND (EBUS);  Surgeon: Isadora Hose, MD;  Location: [facility];  Service: Pulmonary;  Laterality: Bilateral;   FRACTURE SURGERY     I & D EXTREMITY Right 11/04/2016   Procedure: IRRIGATION AND DEBRIDEMENT INDEX FINGER AND PINNING;  Surgeon: Shari Easter, MD;  Location: [facility] OR;  Service: Orthopedics;  Laterality: Right;   KYPHOPLASTY N/A 12/12/2023   Procedure: LUMBAR FIVE KYPHOPLASTY WITH LUMBAR FIVE LESION ABLASION;  Surgeon: Beuford Anes, MD;  Location: [facility] OR;  Service: Orthopedics;  Laterality: N/A;   LITHOTRIPSY  1990s    Family History  Problem Relation Age of Onset   Sleep apnea Mother    Bladder Cancer Father    Hypertension Father    Colon cancer Neg Hx    Esophageal cancer Neg Hx    Prostate cancer Neg Hx    Rectal cancer Neg Hx    Stomach cancer Neg Hx    Thyroid   disease Neg Hx    Migraines Neg Hx    Headache Neg Hx     Social History Social History   Tobacco Use   Smoking status: Former    Current packs/day: 0.00    Average packs/day: 0.5 packs/day for 20.0 years (10.0 ttl pk-yrs)    Types: Cigarettes    Start date: 09/20/1994    Quit date: 09/20/2014    Years since quitting: 9.2    Passive exposure: Never   Smokeless tobacco: Former    Types: Chew   Tobacco comments:    stopped chewing the 1990s. Quit smoking cigarettes in 2015.  Vaping Use   Vaping status: Never Used  Substance Use Topics   Alcohol  use: No    Alcohol /week: 0.0 standard drinks of alcohol    Drug use: No    Current Outpatient Medications  Medication Sig Dispense Refill   ALPRAZolam (XANAX) 0.5 MG tablet Take 0.5 mg by mouth 2 (two) times daily as needed for anxiety.     cyclobenzaprine  (FLEXERIL ) 5 MG tablet Take 1 tablet (5 mg total) by mouth 3 (three) times daily as needed for muscle spasms. 30 tablet 0   ibuprofen  (ADVIL ) 800 MG tablet Take 800 mg by mouth every 8  (eight) hours as needed.     levothyroxine (SYNTHROID) 100 MCG tablet Take 100 mcg by mouth daily.     oxyCODONE -acetaminophen  (PERCOCET/ROXICET) 5-325 MG tablet Take 1 tablet by mouth every 4 (four) hours as needed for severe pain (pain score 7-10).     rizatriptan  (MAXALT -MLT) 5 MG disintegrating tablet Take 5 mg by mouth as needed for migraine. May repeat in 2 hours if needed     traMADol  (ULTRAM ) 50 MG tablet Take 1-2 tablets (50-100 mg total) by mouth every 4 (four) hours as needed. You may be able to take 1-2, if needed, for pain. 60 tablet 0   zolpidem  (AMBIEN ) 10 MG tablet Take 10 mg by mouth at bedtime.     No current facility-administered medications for this visit.    Allergies  Allergen Reactions   Methimazole  Other (See Comments)    WBC drops   Methocarbamol Other (See Comments)    WBC drops    Review of Systems  Constitutional:  Positive for activity change, appetite change, fatigue and unexpected weight change (Has lost 10 to 15 pounds over the past 3 months). Negative for chills, diaphoresis and fever.  HENT:  Positive for hearing loss. Negative for trouble swallowing.   Eyes:  Positive for visual disturbance (Blurry).  Respiratory:  Negative for shortness of breath and wheezing.   Cardiovascular:  Negative for chest pain, palpitations and leg swelling.  Genitourinary:  Negative for difficulty urinating.  Musculoskeletal:  Positive for myalgias.  Neurological:  Negative for seizures and weakness.       Memory problems  Hematological:  Negative for adenopathy. Does not bruise/bleed easily.  All other systems reviewed and are negative.   BP 123/85 (BP Location: Left Arm, Patient Position: Sitting, Cuff Size: Normal)   Pulse 92   Resp 20   Ht 5' 8 (1.727 m)   Wt 160 lb 4.8 oz (72.7 kg)   SpO2 98% Comment: RA  BMI 24.37 kg/m  Physical Exam Vitals reviewed.  Constitutional:      General: [name] not in acute distress.    Appearance: Normal appearance.  HENT:      Head: Normocephalic and atraumatic.  Eyes:     General: No scleral icterus.    Extraocular  Movements: Extraocular movements intact.  Cardiovascular:     Pulses: Normal pulses.     Heart sounds: Normal heart sounds. No murmur heard.    No friction rub. No gallop.  Pulmonary:     Effort: No respiratory distress.     Breath sounds: Normal breath sounds. No wheezing or rales.  Abdominal:     Palpations: Abdomen is soft.  Musculoskeletal:     Cervical back: Neck supple.  Lymphadenopathy:     Cervical: No cervical adenopathy.  Skin:    General: Skin is warm and dry.  Neurological:     General: No focal deficit present.     Mental Status: [name] alert and oriented to person, place, and time.     Cranial Nerves: No cranial nerve deficit.     Motor: No weakness.      Diagnostic Tests: NUCLEAR MEDICINE PET SKULL BASE TO THIGH   TECHNIQUE: 7.8 mCi F-18 FDG was injected intravenously. Full-ring PET imaging was performed from the skull base to thigh after the radiotracer. CT data was obtained and used for attenuation correction and anatomic localization.   Fasting blood glucose: 103 mg/dl   COMPARISON:  CT chest abdomen pelvis 11/22/2023   FINDINGS: Mediastinal blood pool activity: SUV max 1.6   Liver activity: SUV max 2.4   NECK: There are 2 small hypermetabolic low right-sided neck lymph nodes at the thoracic inlet. More superior focus has maximum SUV value of 4.1 in the more caudal 4.9. More superior focus on series 4, image 50 measures 5 mm and the more caudal focus just right of the trachea on image 53 also 5 mm. No additional areas of abnormal uptake along lymph nodes in the neck elsewhere. Near symmetric uptake the visualized portions of the intracranial compartment.   Incidental CT findings: The parotid glands, submandibular glands are preserved. Very small thyroid  gland. Please correlate with prior treatment history. Paranasal sinuses and mastoid air cells  are grossly clear.   CHEST: There are abnormal, hypermetabolic lymph nodes identified in the mediastinum including right paratracheal, prevascular. At least 5 nodes are identified. The right paratracheal node which previously had short axis of 13 mm, today has maximum SUV of 15.1 on image 67. Left pre-vascular node the same level has maximum SUV value of 16.1. No abnormal uptake above blood pool in the axillary regions or hila. No areas of abnormal lung uptake.   Incidental CT findings: Heart nonenlarged. Coronary artery calcifications are seen. Please correlate for other coronary risk factors. Normal caliber thoracic esophagus. Breathing motion. No consolidation, pneumothorax or effusion. Mild bandlike opacity at the inferior aspect of the lingula again likely scar or atelectasis. Please correlate with prior diagnostic contrast CT.   ABDOMEN/PELVIS: As seen on prior CT scan two splenic lesions are seen and are hypermetabolic. The larger more superior focus has maximum SUV of 7.8 and the more caudal focus on image 111 has maximum SUV of 5.6. More superior focus measured 2.9 x 2.5 cm previously. Otherwise there is physiologic distribution radiotracer along the parenchymal organs, bowel and renal collecting systems. No specific areas of abnormal nodal uptake identified this time.   Incidental CT findings: Please correlate with the recent diagnostic CT scan. Grossly the liver, adrenal glands and pancreas are preserved on this limited noncontrast exam. Gallbladder is present. Multiple bilateral renal cysts are again seen. On the prior examination there is a hyperdense lesion posterior along the left mid kidney which head postcontrast Hounsfield unit of 25. On today's noncontrast attenuation  correction CT this has Hounsfield units of 25 again.   Nonobstructing lower pole left-sided renal stone measuring 3 mm. No ureteral stones. Preserved contour to the urinary bladder. Large bowel  has a normal course and caliber with scattered stool. Normal appendix. Stomach and small bowel are nondilated. Proximal duodenal diverticula. Normal caliber aorta and IVC with some scattered vascular calcifications.   SKELETON: Multifocal areas of abnormal uptake along the skeleton consistent with metastatic disease. Several lesions are sclerotic although there some mixed lesions as well. Distribution includes vertebral bodies at L5, L4, T11, T9, T5, T2 and T3. There is also a focus involving posterior elements at T12 and L1. There also areas uptake along ribs including anteriorly along the right first rib, posterior right third, and posterolateral aspect of the right ninth rib. The most extensive areas are along the lower lumbar spine at L4 and L5. Uptake at L5 along the mixed lucent and sclerotic lesion has maximum SUV of 15.1.   Incidental CT findings: Associated compression deformity of L5 vertebral body which could be pathologic. Scattered degenerative changes. Streak artifact related to the hardware involving the left clavicle.   IMPRESSION: Multifocal hypermetabolic areas of disease.   Two hypermetabolic splenic lesions are identified.   Multifocal hypermetabolic bone lesions scattered predominately in the spine but also rib lesions. These are mixed density. The largest and most intense area is involving the L5 vertebral body with lucency and sclerosis and partial compression. This could be pathologic.   Several hypermetabolic lymph nodes identified in the thorax including paratracheal and prevascular as well as 2 nodes at the right thoracic inlet.   The hyperdense left-sided renal lesion posteriorly is also hyperdense on this noncontrast CT scan associated and likely a proteinaceous or hemorrhagic lesion. Simple attention on follow-up for the patient's neoplasm.     Electronically Signed   By: Ranell Bring M.D.   On: 12/12/2023 11:30 I personally reviewed the  PET/CT images.  Hypermetabolic mediastinal lymph nodes.  Multiple bone lesions.  Impression: [place] Du[place] is a 58 year old man with a history of Graves' disease and remote tobacco abuse.   Presented with back pain due to a presumed pathologic fracture.  Biopsy of that site did not reveal any definite cancer.  Endobronchial ultrasound was also unsuccessful.  PET/CT shows multiple enlarged hypermetabolic mediastinal lymph nodes as well as numerous spinal lesions.  Differential diagnosis includes lymphoma, carcinoma, sarcoidosis.  He needs a definitive diagnosis.  I recommended that we do a mediastinoscopy.  Only the 4R node will be accessible but should be the same process occurring at other sites.  More likely to get a definitive diagnosis with a tissue biopsy than needle aspiration.  Anterior mediastinal and AP window nodes would require a VATS approach, which I do not think would be necessary in his case.  I described the proposed operative procedure to Mr. and Mrs. [place]adoo.  They understand the general nature of the procedure including the need for general anesthesia, the incision to be used, and the plan to do this on an outpatient basis.  I informed them of the indications, risks, benefits, and alternatives.  They understand the risks include death, MI, DVT, PE, bleeding, possible need for conversion to open procedure for control of bleeding, infection, pneumothorax, recurrent nerve injury with hoarseness, as well as possibility of other unforeseeable complications.  He accepts the risks and agrees to proceed.  Plan: Mediastinoscopy on Thursday, 01/03/2024  Elspeth JAYSON Millers, MD [facility] [phone]

## 2024-01-01 NOTE — H&P (View-Only) (Signed)
 PCP is Larnell Hamilton, MD Referring Provider is Timmy Maude SAUNDERS, MD  Chief Complaint  Patient presents with   Mediastinal Lymphadenopathy    New patient consult, EBUS 7/29, PET 7/21, chest CT 7/3    HPI: Lance [name] is seen for consultation regarding mediastinal adenopathy.  Lance [name] is a 58 year old man with a history of Graves' disease and remote tobacco abuse.  He presented with back pain.  He had an MRI which showed replacement of the marrow at L4 and L5.  There was a pathologic fracture of L5.  He underwent biopsy and kyphoplasty of L5 by Dr. Beuford, but pathology did not show any malignancy.  PET scan showed multiple spinal lesions.  There also was mediastinal adenopathy.  He had an EBUS done by Dr. Isadora that was also nondiagnostic.  He send loss of appetite.  He has lost about 10 to 15 pounds over the past 3 months.  No fevers, chills, sweats.  Denies chest pain or shortness of breath.  Does continue to have back pain.  Has about a 30-pack-year history of smoking prior to quitting 10 years ago  Zubrod Score: At the time of surgery this patient's most appropriate activity status/level should be described as: [x]     0    Normal activity, no symptoms []     1    Restricted in physical strenuous activity but ambulatory, able to do out light work []     2    Ambulatory and capable of self care, unable to do work activities, up and about >50 % of waking hours                              []     3    Only limited self care, in bed greater than 50% of waking hours []     4    Completely disabled, no self care, confined to bed or chair []     5    Moribund  Past Medical History:  Diagnosis Date   Anxiety    Graves disease    dx'd 01/2016   History of kidney stones    Hyperthyroidism    Hypothyroid    Leukopenia    Mediastinal lymphadenopathy    Neutropenia (HCC)    Neutropenic fever (HCC)    Pneumonia ~ 2015 X 1    Past Surgical History:  Procedure Laterality Date   CLAVICLE  SURGERY Left 1980s   has plates and pins   ENDOBRONCHIAL ULTRASOUND Bilateral 12/18/2023   Procedure: ENDOBRONCHIAL ULTRASOUND (EBUS);  Surgeon: Isadora Hose, MD;  Location: [facility];  Service: Pulmonary;  Laterality: Bilateral;   FRACTURE SURGERY     I & D EXTREMITY Right 11/04/2016   Procedure: IRRIGATION AND DEBRIDEMENT INDEX FINGER AND PINNING;  Surgeon: Shari Easter, MD;  Location: [facility] OR;  Service: Orthopedics;  Laterality: Right;   KYPHOPLASTY N/A 12/12/2023   Procedure: LUMBAR FIVE KYPHOPLASTY WITH LUMBAR FIVE LESION ABLASION;  Surgeon: Beuford Anes, MD;  Location: [facility] OR;  Service: Orthopedics;  Laterality: N/A;   LITHOTRIPSY  1990s    Family History  Problem Relation Age of Onset   Sleep apnea Mother    Bladder Cancer Father    Hypertension Father    Colon cancer Neg Hx    Esophageal cancer Neg Hx    Prostate cancer Neg Hx    Rectal cancer Neg Hx    Stomach cancer Neg Hx    Thyroid   disease Neg Hx    Migraines Neg Hx    Headache Neg Hx     Social History Social History   Tobacco Use   Smoking status: Former    Current packs/day: 0.00    Average packs/day: 0.5 packs/day for 20.0 years (10.0 ttl pk-yrs)    Types: Cigarettes    Start date: 09/20/1994    Quit date: 09/20/2014    Years since quitting: 9.2    Passive exposure: Never   Smokeless tobacco: Former    Types: Chew   Tobacco comments:    stopped chewing the 1990s. Quit smoking cigarettes in 2015.  Vaping Use   Vaping status: Never Used  Substance Use Topics   Alcohol  use: No    Alcohol /week: 0.0 standard drinks of alcohol    Drug use: No    Current Outpatient Medications  Medication Sig Dispense Refill   ALPRAZolam (XANAX) 0.5 MG tablet Take 0.5 mg by mouth 2 (two) times daily as needed for anxiety.     cyclobenzaprine  (FLEXERIL ) 5 MG tablet Take 1 tablet (5 mg total) by mouth 3 (three) times daily as needed for muscle spasms. 30 tablet 0   ibuprofen  (ADVIL ) 800 MG tablet Take 800 mg by mouth every 8  (eight) hours as needed.     levothyroxine (SYNTHROID) 100 MCG tablet Take 100 mcg by mouth daily.     oxyCODONE -acetaminophen  (PERCOCET/ROXICET) 5-325 MG tablet Take 1 tablet by mouth every 4 (four) hours as needed for severe pain (pain score 7-10).     rizatriptan  (MAXALT -MLT) 5 MG disintegrating tablet Take 5 mg by mouth as needed for migraine. May repeat in 2 hours if needed     traMADol  (ULTRAM ) 50 MG tablet Take 1-2 tablets (50-100 mg total) by mouth every 4 (four) hours as needed. You may be able to take 1-2, if needed, for pain. 60 tablet 0   zolpidem  (AMBIEN ) 10 MG tablet Take 10 mg by mouth at bedtime.     No current facility-administered medications for this visit.    Allergies  Allergen Reactions   Methimazole  Other (See Comments)    WBC drops   Methocarbamol Other (See Comments)    WBC drops    Review of Systems  Constitutional:  Positive for activity change, appetite change, fatigue and unexpected weight change (Has lost 10 to 15 pounds over the past 3 months). Negative for chills, diaphoresis and fever.  HENT:  Positive for hearing loss. Negative for trouble swallowing.   Eyes:  Positive for visual disturbance (Blurry).  Respiratory:  Negative for shortness of breath and wheezing.   Cardiovascular:  Negative for chest pain, palpitations and leg swelling.  Genitourinary:  Negative for difficulty urinating.  Musculoskeletal:  Positive for myalgias.  Neurological:  Negative for seizures and weakness.       Memory problems  Hematological:  Negative for adenopathy. Does not bruise/bleed easily.  All other systems reviewed and are negative.   BP 123/85 (BP Location: Left Arm, Patient Position: Sitting, Cuff Size: Normal)   Pulse 92   Resp 20   Ht 5' 8 (1.727 m)   Wt 160 lb 4.8 oz (72.7 kg)   SpO2 98% Comment: RA  BMI 24.37 kg/m  Physical Exam Vitals reviewed.  Constitutional:      General: [name] not in acute distress.    Appearance: Normal appearance.  HENT:      Head: Normocephalic and atraumatic.  Eyes:     General: No scleral icterus.    Extraocular  Movements: Extraocular movements intact.  Cardiovascular:     Pulses: Normal pulses.     Heart sounds: Normal heart sounds. No murmur heard.    No friction rub. No gallop.  Pulmonary:     Effort: No respiratory distress.     Breath sounds: Normal breath sounds. No wheezing or rales.  Abdominal:     Palpations: Abdomen is soft.  Musculoskeletal:     Cervical back: Neck supple.  Lymphadenopathy:     Cervical: No cervical adenopathy.  Skin:    General: Skin is warm and dry.  Neurological:     General: No focal deficit present.     Mental Status: [name] alert and oriented to person, place, and time.     Cranial Nerves: No cranial nerve deficit.     Motor: No weakness.      Diagnostic Tests: NUCLEAR MEDICINE PET SKULL BASE TO THIGH   TECHNIQUE: 7.8 mCi F-18 FDG was injected intravenously. Full-ring PET imaging was performed from the skull base to thigh after the radiotracer. CT data was obtained and used for attenuation correction and anatomic localization.   Fasting blood glucose: 103 mg/dl   COMPARISON:  CT chest abdomen pelvis 11/22/2023   FINDINGS: Mediastinal blood pool activity: SUV max 1.6   Liver activity: SUV max 2.4   NECK: There are 2 small hypermetabolic low right-sided neck lymph nodes at the thoracic inlet. More superior focus has maximum SUV value of 4.1 in the more caudal 4.9. More superior focus on series 4, image 50 measures 5 mm and the more caudal focus just right of the trachea on image 53 also 5 mm. No additional areas of abnormal uptake along lymph nodes in the neck elsewhere. Near symmetric uptake the visualized portions of the intracranial compartment.   Incidental CT findings: The parotid glands, submandibular glands are preserved. Very small thyroid  gland. Please correlate with prior treatment history. Paranasal sinuses and mastoid air cells  are grossly clear.   CHEST: There are abnormal, hypermetabolic lymph nodes identified in the mediastinum including right paratracheal, prevascular. At least 5 nodes are identified. The right paratracheal node which previously had short axis of 13 mm, today has maximum SUV of 15.1 on image 67. Left pre-vascular node the same level has maximum SUV value of 16.1. No abnormal uptake above blood pool in the axillary regions or hila. No areas of abnormal lung uptake.   Incidental CT findings: Heart nonenlarged. Coronary artery calcifications are seen. Please correlate for other coronary risk factors. Normal caliber thoracic esophagus. Breathing motion. No consolidation, pneumothorax or effusion. Mild bandlike opacity at the inferior aspect of the lingula again likely scar or atelectasis. Please correlate with prior diagnostic contrast CT.   ABDOMEN/PELVIS: As seen on prior CT scan two splenic lesions are seen and are hypermetabolic. The larger more superior focus has maximum SUV of 7.8 and the more caudal focus on image 111 has maximum SUV of 5.6. More superior focus measured 2.9 x 2.5 cm previously. Otherwise there is physiologic distribution radiotracer along the parenchymal organs, bowel and renal collecting systems. No specific areas of abnormal nodal uptake identified this time.   Incidental CT findings: Please correlate with the recent diagnostic CT scan. Grossly the liver, adrenal glands and pancreas are preserved on this limited noncontrast exam. Gallbladder is present. Multiple bilateral renal cysts are again seen. On the prior examination there is a hyperdense lesion posterior along the left mid kidney which head postcontrast Hounsfield unit of 25. On today's noncontrast attenuation  correction CT this has Hounsfield units of 25 again.   Nonobstructing lower pole left-sided renal stone measuring 3 mm. No ureteral stones. Preserved contour to the urinary bladder. Large bowel  has a normal course and caliber with scattered stool. Normal appendix. Stomach and small bowel are nondilated. Proximal duodenal diverticula. Normal caliber aorta and IVC with some scattered vascular calcifications.   SKELETON: Multifocal areas of abnormal uptake along the skeleton consistent with metastatic disease. Several lesions are sclerotic although there some mixed lesions as well. Distribution includes vertebral bodies at L5, L4, T11, T9, T5, T2 and T3. There is also a focus involving posterior elements at T12 and L1. There also areas uptake along ribs including anteriorly along the right first rib, posterior right third, and posterolateral aspect of the right ninth rib. The most extensive areas are along the lower lumbar spine at L4 and L5. Uptake at L5 along the mixed lucent and sclerotic lesion has maximum SUV of 15.1.   Incidental CT findings: Associated compression deformity of L5 vertebral body which could be pathologic. Scattered degenerative changes. Streak artifact related to the hardware involving the left clavicle.   IMPRESSION: Multifocal hypermetabolic areas of disease.   Two hypermetabolic splenic lesions are identified.   Multifocal hypermetabolic bone lesions scattered predominately in the spine but also rib lesions. These are mixed density. The largest and most intense area is involving the L5 vertebral body with lucency and sclerosis and partial compression. This could be pathologic.   Several hypermetabolic lymph nodes identified in the thorax including paratracheal and prevascular as well as 2 nodes at the right thoracic inlet.   The hyperdense left-sided renal lesion posteriorly is also hyperdense on this noncontrast CT scan associated and likely a proteinaceous or hemorrhagic lesion. Simple attention on follow-up for the patient's neoplasm.     Electronically Signed   By: Ranell Bring M.D.   On: 12/12/2023 11:30 I personally reviewed the  PET/CT images.  Hypermetabolic mediastinal lymph nodes.  Multiple bone lesions.  Impression: [place] Du[place] is a 58 year old man with a history of Graves' disease and remote tobacco abuse.   Presented with back pain due to a presumed pathologic fracture.  Biopsy of that site did not reveal any definite cancer.  Endobronchial ultrasound was also unsuccessful.  PET/CT shows multiple enlarged hypermetabolic mediastinal lymph nodes as well as numerous spinal lesions.  Differential diagnosis includes lymphoma, carcinoma, sarcoidosis.  He needs a definitive diagnosis.  I recommended that we do a mediastinoscopy.  Only the 4R node will be accessible but should be the same process occurring at other sites.  More likely to get a definitive diagnosis with a tissue biopsy than needle aspiration.  Anterior mediastinal and AP window nodes would require a VATS approach, which I do not think would be necessary in his case.  I described the proposed operative procedure to Mr. and Mrs. [place]adoo.  They understand the general nature of the procedure including the need for general anesthesia, the incision to be used, and the plan to do this on an outpatient basis.  I informed them of the indications, risks, benefits, and alternatives.  They understand the risks include death, MI, DVT, PE, bleeding, possible need for conversion to open procedure for control of bleeding, infection, pneumothorax, recurrent nerve injury with hoarseness, as well as possibility of other unforeseeable complications.  He accepts the risks and agrees to proceed.  Plan: Mediastinoscopy on Thursday, 01/03/2024  Elspeth JAYSON Millers, MD [facility] [phone]

## 2024-01-02 ENCOUNTER — Other Ambulatory Visit: Payer: Self-pay

## 2024-01-02 ENCOUNTER — Encounter: Payer: Self-pay | Admitting: *Deleted

## 2024-01-02 ENCOUNTER — Encounter (HOSPITAL_COMMUNITY): Payer: Self-pay | Admitting: Thoracic Surgery (Cardiothoracic Vascular Surgery)

## 2024-01-02 NOTE — Progress Notes (Signed)
 Patient is scheduled for mediastinoscopy tomorrow. Will follow for path.  Oncology Nurse Navigator Documentation     01/02/2024    8:00 AM  Oncology Nurse Navigator Flowsheets  Navigator Follow Up Date: 01/08/2024  Navigator Follow Up Reason: Pathology  Navigator Location [facility]  Navigator Encounter Type Appt/Treatment Plan Review  Treatment Phase Abnormal Scans  Barriers/Navigation Needs Coordination of Care;Education  Interventions None Required  Acuity Level 2-Minimal Needs (1-2 Barriers Identified)  Support Groups/Services Friends and Family  Time Spent with Patient 15

## 2024-01-02 NOTE — Progress Notes (Signed)
 SDW CALL  Patient was given pre-op instructions over the phone. The opportunity was given for the patient to ask questions. No further questions asked. Patient verbalized understanding of instructions given.   PCP - Glendia Freeman Cardiologist - denies  PPM/ICD - denies  Chest x-ray - DOS EKG - DOS Stress Test - denies ECHO - denies Cardiac Cath - denies  Sleep Study - denies  NO DM  Last dose of GLP1 agonist-  n/a GLP1 instructions:  n/a  Blood Thinner Instructions: n/a Aspirin Instructions: n/a  ERAS Protcol - NPO PRE-SURGERY Ensure or G2- n/a  COVID TEST- no   Anesthesia review: no  Patient denies shortness of breath, fever, cough and chest pain over the phone call   All instructions explained to the patient, with a verbal understanding of the material. Patient agrees to go over the instructions while at home for a better understanding.

## 2024-01-03 ENCOUNTER — Ambulatory Visit (HOSPITAL_COMMUNITY): Admitting: Certified Registered Nurse Anesthetist

## 2024-01-03 ENCOUNTER — Other Ambulatory Visit: Payer: Self-pay

## 2024-01-03 ENCOUNTER — Ambulatory Visit (HOSPITAL_COMMUNITY)

## 2024-01-03 ENCOUNTER — Ambulatory Visit (HOSPITAL_COMMUNITY)
Admission: RE | Admit: 2024-01-03 | Discharge: 2024-01-03 | Disposition: A | Discharge status: Home / Self Care | Attending: Thoracic Surgery (Cardiothoracic Vascular Surgery) | Admitting: Thoracic Surgery (Cardiothoracic Vascular Surgery)

## 2024-01-03 ENCOUNTER — Encounter (HOSPITAL_COMMUNITY): Payer: Self-pay | Admitting: Thoracic Surgery (Cardiothoracic Vascular Surgery)

## 2024-01-03 ENCOUNTER — Encounter (HOSPITAL_COMMUNITY)
Admission: RE | Disposition: A | Discharge status: Home / Self Care | Payer: Self-pay | Source: Home / Self Care | Attending: Thoracic Surgery (Cardiothoracic Vascular Surgery)

## 2024-01-03 DIAGNOSIS — R590 Localized enlarged lymph nodes: Secondary | ICD-10-CM

## 2024-01-03 DIAGNOSIS — E0500 Thyrotoxicosis with diffuse goiter without thyrotoxic crisis or storm: Secondary | ICD-10-CM | POA: Insufficient documentation

## 2024-01-03 DIAGNOSIS — Z87891 Personal history of nicotine dependence: Secondary | ICD-10-CM | POA: Insufficient documentation

## 2024-01-03 DIAGNOSIS — F419 Anxiety disorder, unspecified: Secondary | ICD-10-CM | POA: Insufficient documentation

## 2024-01-03 DIAGNOSIS — E039 Hypothyroidism, unspecified: Secondary | ICD-10-CM | POA: Diagnosis not present

## 2024-01-03 DIAGNOSIS — C089 Malignant neoplasm of major salivary gland, unspecified: Secondary | ICD-10-CM | POA: Diagnosis not present

## 2024-01-03 DIAGNOSIS — Z01818 Encounter for other preprocedural examination: Secondary | ICD-10-CM | POA: Diagnosis not present

## 2024-01-03 DIAGNOSIS — C801 Malignant (primary) neoplasm, unspecified: Secondary | ICD-10-CM | POA: Diagnosis not present

## 2024-01-03 DIAGNOSIS — C771 Secondary and unspecified malignant neoplasm of intrathoracic lymph nodes: Secondary | ICD-10-CM | POA: Diagnosis not present

## 2024-01-03 HISTORY — PX: MEDIASTINOSCOPY: SHX5086

## 2024-01-03 LAB — COMPREHENSIVE METABOLIC PANEL WITH GFR
ALT: 36 U/L (ref 0–44)
AST: 34 U/L (ref 15–41)
Albumin: 3.8 g/dL (ref 3.5–5.0)
Alkaline Phosphatase: 91 U/L (ref 38–126)
Anion gap: 9 (ref 5–15)
BUN: 17 mg/dL (ref 6–20)
CO2: 23 mmol/L (ref 22–32)
Calcium: 9.6 mg/dL (ref 8.9–10.3)
Chloride: 107 mmol/L (ref 98–111)
Creatinine, Ser: 0.82 mg/dL (ref 0.61–1.24)
GFR, Estimated: >60 mL/min (ref >60)
Glucose, Bld: 92 mg/dL (ref 70–99)
Potassium: 4.4 mmol/L (ref 3.5–5.1)
Sodium: 139 mmol/L (ref 135–145)
Total Bilirubin: 1.1 mg/dL (ref 0.0–1.2)
Total Protein: 6.5 g/dL (ref 6.5–8.1)

## 2024-01-03 LAB — TYPE AND SCREEN
ABO/RH(D): A POS
Antibody Screen: NEGATIVE

## 2024-01-03 LAB — ABO/RH: ABO/RH(D): A POS

## 2024-01-03 LAB — CBC
HCT: 42.5 % (ref 39.0–52.0)
Hemoglobin: 14.5 g/dL (ref 13.0–17.0)
MCH: 30.8 pg (ref 26.0–34.0)
MCHC: 34.1 g/dL (ref 30.0–36.0)
MCV: 90.2 fL (ref 80.0–100.0)
Platelets: 314 K/uL (ref 150–400)
RBC: 4.71 MIL/uL (ref 4.22–5.81)
RDW: 12.6 % (ref 11.5–15.5)
WBC: 5.9 K/uL (ref 4.0–10.5)
nRBC: 0 % (ref 0.0–0.2)

## 2024-01-03 LAB — PROTIME-INR
INR: 1 (ref 0.8–1.2)
Prothrombin Time: 14 s (ref 11.4–15.2)

## 2024-01-03 LAB — SURGICAL PCR SCREEN
MRSA, PCR: NEGATIVE
Staphylococcus aureus: NEGATIVE

## 2024-01-03 LAB — APTT: aPTT: 28 s (ref 24–36)

## 2024-01-03 SURGERY — MEDIASTINOSCOPY
Anesthesia: General | Site: Chest

## 2024-01-03 MED ORDER — MIDAZOLAM HCL 2 MG/2ML IJ SOLN
INTRAMUSCULAR | Status: DC | PRN
  Administered 2024-01-03: 2 mg via INTRAVENOUS

## 2024-01-03 MED ORDER — CEFAZOLIN SODIUM-DEXTROSE 2-4 GM/100ML-% IV SOLN
2.0000 g | INTRAVENOUS | Status: AC
  Administered 2024-01-03: 2 g via INTRAVENOUS
  Filled 2024-01-03: qty 100

## 2024-01-03 MED ORDER — DEXAMETHASONE SODIUM PHOSPHATE 10 MG/ML IJ SOLN
INTRAMUSCULAR | Status: DC | PRN
  Administered 2024-01-03: 10 mg via INTRAVENOUS

## 2024-01-03 MED ORDER — OXYCODONE HCL 5 MG PO TABS: 5.0000 mg | ORAL_TABLET | Freq: Once | ORAL | Status: DC | PRN

## 2024-01-03 MED ORDER — SUGAMMADEX SODIUM 200 MG/2ML IV SOLN
INTRAVENOUS | Status: DC | PRN
  Administered 2024-01-03: 200 mg via INTRAVENOUS

## 2024-01-03 MED ORDER — OXYCODONE HCL 5 MG/5ML PO SOLN: 5.0000 mg | Freq: Once | ORAL | Status: DC | PRN

## 2024-01-03 MED ORDER — FENTANYL CITRATE (PF) 250 MCG/5ML IJ SOLN
INTRAMUSCULAR | Status: AC
  Filled 2024-01-03: qty 5

## 2024-01-03 MED ORDER — FENTANYL CITRATE (PF) 100 MCG/2ML IJ SOLN
INTRAMUSCULAR | Status: AC
  Filled 2024-01-03: qty 2

## 2024-01-03 MED ORDER — LIDOCAINE HCL (CARDIAC) PF 100 MG/5ML IV SOSY
PREFILLED_SYRINGE | INTRAVENOUS | Status: DC | PRN
  Administered 2024-01-03: 80 mg via INTRAVENOUS

## 2024-01-03 MED ORDER — MIDAZOLAM HCL 2 MG/2ML IJ SOLN
INTRAMUSCULAR | Status: AC
  Filled 2024-01-03: qty 2

## 2024-01-03 MED ORDER — CHLORHEXIDINE GLUCONATE 0.12 % MT SOLN
15.0000 mL | Freq: Once | OROMUCOSAL | Status: AC
  Administered 2024-01-03: 15 mL via OROMUCOSAL
  Filled 2024-01-03: qty 15

## 2024-01-03 MED ORDER — AMISULPRIDE (ANTIEMETIC) 5 MG/2ML IV SOLN: 10.0000 mg | Freq: Once | INTRAVENOUS | Status: DC | PRN

## 2024-01-03 MED ORDER — FENTANYL CITRATE (PF) 100 MCG/2ML IJ SOLN
25.0000 ug | INTRAMUSCULAR | Status: DC | PRN
  Administered 2024-01-03 (×2): 50 ug via INTRAVENOUS

## 2024-01-03 MED ORDER — ROCURONIUM BROMIDE 10 MG/ML (PF) SYRINGE
PREFILLED_SYRINGE | INTRAVENOUS | Status: DC | PRN
  Administered 2024-01-03: 10 mg via INTRAVENOUS
  Administered 2024-01-03: 50 mg via INTRAVENOUS

## 2024-01-03 MED ORDER — ORAL CARE MOUTH RINSE: 15.0000 mL | Freq: Once | OROMUCOSAL | Status: AC

## 2024-01-03 MED ORDER — LACTATED RINGERS IV SOLN: INTRAVENOUS | Status: DC

## 2024-01-03 MED ORDER — ONDANSETRON HCL 4 MG/2ML IJ SOLN
INTRAMUSCULAR | Status: DC | PRN
  Administered 2024-01-03: 4 mg via INTRAVENOUS

## 2024-01-03 MED ORDER — PROPOFOL 10 MG/ML IV BOLUS
INTRAVENOUS | Status: AC
  Filled 2024-01-03: qty 20

## 2024-01-03 MED ORDER — PROPOFOL 10 MG/ML IV BOLUS
INTRAVENOUS | Status: DC | PRN
  Administered 2024-01-03: 150 mg via INTRAVENOUS
  Administered 2024-01-03: 50 mg via INTRAVENOUS

## 2024-01-03 MED ORDER — 0.9 % SODIUM CHLORIDE (POUR BTL) OPTIME
TOPICAL | Status: DC | PRN
  Administered 2024-01-03: 1000 mL

## 2024-01-03 MED ORDER — LACTATED RINGERS IV SOLN: INTRAVENOUS | Status: DC | PRN

## 2024-01-03 MED ORDER — FENTANYL CITRATE (PF) 250 MCG/5ML IJ SOLN
INTRAMUSCULAR | Status: DC | PRN
  Administered 2024-01-03 (×3): 50 ug via INTRAVENOUS
  Administered 2024-01-03: 100 ug via INTRAVENOUS

## 2024-01-03 SURGICAL SUPPLY — 39 items
APPLIER CLIP LOGIC TI 5 (MISCELLANEOUS) IMPLANT
BLADE CLIPPER SURG (BLADE) IMPLANT
BLADE SURG 15 STRL LF DISP TIS (BLADE) ×1 IMPLANT
CANISTER SUCTION 3000ML PPV (SUCTIONS) ×1 IMPLANT
CLIP TI MEDIUM 6 (CLIP) ×1 IMPLANT
CLIP TI WIDE RED SMALL 6 (CLIP) ×1 IMPLANT
CNTNR URN SCR LID CUP LEK RST (MISCELLANEOUS) ×2 IMPLANT
COVER SURGICAL LIGHT HANDLE (MISCELLANEOUS) ×2 IMPLANT
DERMABOND ADVANCED .7 DNX12 (GAUZE/BANDAGES/DRESSINGS) ×1 IMPLANT
DRAPE CHEST BREAST 15X10 FENES (DRAPES) ×1 IMPLANT
ELECTRODE REM PT RTRN 9FT ADLT (ELECTROSURGICAL) ×1 IMPLANT
GAUZE 4X4 16PLY ~~LOC~~+RFID DBL (SPONGE) ×2 IMPLANT
GLOVE SS BIOGEL STRL SZ 7.5 (GLOVE) ×1 IMPLANT
GLOVE SURG MICRO LTX SZ7.5 (GLOVE) ×1 IMPLANT
GOWN STRL REUS W/ TWL LRG LVL3 (GOWN DISPOSABLE) ×1 IMPLANT
GOWN STRL REUS W/ TWL XL LVL3 (GOWN DISPOSABLE) ×1 IMPLANT
HEMOSTAT SURGICEL 2X14 (HEMOSTASIS) IMPLANT
KIT BASIN OR (CUSTOM PROCEDURE TRAY) ×1 IMPLANT
KIT TURNOVER KIT B (KITS) ×1 IMPLANT
NS IRRIG 1000ML POUR BTL (IV SOLUTION) ×1 IMPLANT
PACK GENERAL/GYN (CUSTOM PROCEDURE TRAY) ×1 IMPLANT
PAD ARMBOARD POSITIONER FOAM (MISCELLANEOUS) ×2 IMPLANT
PENCIL BUTTON HOLSTER BLD 10FT (ELECTRODE) ×1 IMPLANT
SPONGE INTESTINAL PEANUT (DISPOSABLE) IMPLANT
SPONGE T-LAP 18X18 ~~LOC~~+RFID (SPONGE) ×4 IMPLANT
SPONGE T-LAP 4X18 ~~LOC~~+RFID (SPONGE) ×1 IMPLANT
SUT MNCRL AB 4-0 PS2 18 (SUTURE) IMPLANT
SUT SILK 2-0 18XBRD TIE 12 (SUTURE) IMPLANT
SUT SILK 3 0 SH CR/8 (SUTURE) IMPLANT
SUT VIC AB 2-0 CT1 TAPERPNT 27 (SUTURE) IMPLANT
SUT VIC AB 3-0 SH 27XBRD (SUTURE) ×1 IMPLANT
SUT VIC AB 3-0 X1 27 (SUTURE) IMPLANT
SUT VIC AB 4-0 PS2 18 (SUTURE) ×1 IMPLANT
SUT VICRYL 3-0 CR8 SH (SUTURE) IMPLANT
SYR 10ML LL (SYRINGE) ×1 IMPLANT
SYR 20CC LL (SYRINGE) ×1 IMPLANT
TOWEL GREEN STERILE (TOWEL DISPOSABLE) ×1 IMPLANT
TOWEL GREEN STERILE FF (TOWEL DISPOSABLE) ×1 IMPLANT
WATER STERILE IRR 1000ML POUR (IV SOLUTION) ×1 IMPLANT

## 2024-01-03 NOTE — Anesthesia Postprocedure Evaluation (Signed)
 Anesthesia Post Note  Patient: Lance [name]  Procedure(s) Performed: MEDIASTINOSCOPY (Chest)     Patient location during evaluation: PACU Anesthesia Type: General Level of consciousness: awake and alert Pain management: pain level controlled Vital Signs Assessment: post-procedure vital signs reviewed and stable Respiratory status: spontaneous breathing, nonlabored ventilation, respiratory function stable and patient connected to nasal cannula oxygen Cardiovascular status: blood pressure returned to baseline and stable Postop Assessment: no apparent nausea or vomiting Anesthetic complications: no   No notable events documented.  Last Vitals:  Vitals:   01/03/24 1615 01/03/24 1630  BP: (!) 142/93 (!) 149/96  Pulse: (!) 105 (!) 109  Resp: 13 (!) 8  Temp:  36.8 C  SpO2: 96% 95%    Last Pain:  Vitals:   01/03/24 1624  TempSrc:   PainSc: 4                  Epifanio Lamar BRAVO

## 2024-01-03 NOTE — Anesthesia Procedure Notes (Signed)
 Procedure Name: Intubation Date/Time: 01/03/2024 2:29 PM  Performed by: Cindie Donald CROME, CRNAPre-anesthesia Checklist: Patient identified, Emergency Drugs available, Suction available and Patient being monitored Patient Re-evaluated:Patient Re-evaluated prior to induction Oxygen Delivery Method: Circle System Utilized Preoxygenation: Pre-oxygenation with 100% oxygen Induction Type: IV induction Ventilation: Mask ventilation without difficulty, Oral airway inserted - appropriate to patient size and Two handed mask ventilation required Laryngoscope Size: Mac and 4 Grade View: Grade II Tube type: Oral Tube size: 7.5 mm Number of attempts: 1 Airway Equipment and Method: Stylet and Oral airway Placement Confirmation: ETT inserted through vocal cords under direct vision, positive ETCO2 and breath sounds checked- equal and bilateral Secured at: 22 cm Tube secured with: Tape Dental Injury: Teeth and Oropharynx as per pre-operative assessment

## 2024-01-03 NOTE — Transfer of Care (Signed)
 Immediate Anesthesia Transfer of Care Note  Patient: Lance [name]  Procedure(s) Performed: MEDIASTINOSCOPY (Chest)  Patient Location: PACU  Anesthesia Type:General  Level of Consciousness: awake, alert , oriented, and patient cooperative  Airway & Oxygen Therapy: Patient Spontanous Breathing  Post-op Assessment: Report given to RN and Post -op Vital signs reviewed and stable  Post vital signs: Reviewed and stable  Last Vitals:  Vitals Value Taken Time  BP 140/95 01/03/24 15:40  Temp    Pulse 113 01/03/24 15:42  Resp 22 01/03/24 15:42  SpO2 96 % 01/03/24 15:42  Vitals shown include unfiled device data.  Last Pain:  Vitals:   01/03/24 1149  TempSrc:   PainSc: 0-No pain         Complications: No notable events documented.

## 2024-01-03 NOTE — Anesthesia Procedure Notes (Signed)
 Arterial Line Insertion Start/End8/14/2025 1:00 PM, 01/03/2024 1:05 PM Performed by: Claudene Arlin LABOR, CRNA, CRNA  Patient location: Pre-op. Preanesthetic checklist: patient identified, IV checked, site marked, risks and benefits discussed, surgical consent, monitors and equipment checked, pre-op evaluation, timeout performed and anesthesia consent Lidocaine  1% used for infiltration Right, radial was placed Catheter size: 20 G Hand hygiene performed  and maximum sterile barriers used  Allen's test indicative of satisfactory collateral circulation Attempts: 1 Procedure performed using ultrasound guided technique. Ultrasound Notes:anatomy identified, needle tip was noted to be adjacent to the nerve/plexus identified and no ultrasound evidence of intravascular and/or intraneural injection Following insertion, dressing applied and Biopatch. Post procedure assessment: normal and unchanged  Patient tolerated the procedure well with no immediate complications.

## 2024-01-03 NOTE — Interval H&P Note (Signed)
 History and Physical Interval Note:  01/03/2024 5:18 PM  Lance [name]  has presented today for surgery, with the diagnosis of MEDIASTINAL ADENOPATHY.  The various methods of treatment have been discussed with the patient and family. After consideration of risks, benefits and other options for treatment, the patient has consented to  Procedure(s): MEDIASTINOSCOPY (N/A) as a surgical intervention.  The patient's history has been reviewed, patient examined, no change in status, stable for surgery.  I have reviewed the patient's chart and labs.  Questions were answered to the patient's satisfaction.     Elspeth JAYSON Millers

## 2024-01-03 NOTE — Discharge Instructions (Addendum)
 Do not drive or engage in heavy physical activity for 48 hours.  You may shower tomorrow.  There is a medical adhesive over the incision, it will begin to peel off in about 10-14 days- once it begins to pull off you can remove the remainder.  You may use the tramadol  you already have for pain.  Use as directed.  You may use acetaminophen  and/ or ibuprofen  in addition to, or instead of, the tramadol .  Often the combination works better than any one individual medication.  Place an ice pack over the incision for 20 minutes 4 times daily this evening and tomorrow.  Call [phone] if you develop chest pain, shortness of breath, fever > 101 F or notice excessive pain, swelling, redness or drainage from the incision.  My office will contact you with a follow up appointment.  Follow up with Dr. Timmy

## 2024-01-03 NOTE — Anesthesia Preprocedure Evaluation (Signed)
 Anesthesia Evaluation  Patient identified by MRN, date of birth, ID band Patient awake    Reviewed: Allergy & Precautions, NPO status , Patient's Chart, lab work & pertinent test results  History of Anesthesia Complications Negative for: history of anesthetic complications  Airway Mallampati: II  TM Distance: >3 FB Neck ROM: Full    Dental  (+) Missing   Pulmonary former smoker   Pulmonary exam normal breath sounds clear to auscultation       Cardiovascular Exercise Tolerance: Good negative cardio ROS Normal cardiovascular exam Rhythm:Regular Rate:Normal     Neuro/Psych  PSYCHIATRIC DISORDERS Anxiety     negative neurological ROS     GI/Hepatic negative GI ROS,,,  Endo/Other  Hypothyroidism    Renal/GU Renal disease (nephrolithiasis)     Musculoskeletal   Abdominal   Peds  Hematology negative hematology ROS (+)   Anesthesia Other Findings   Reproductive/Obstetrics                              Anesthesia Physical Anesthesia Plan  ASA: 2  Anesthesia Plan: General   Post-op Pain Management: Ofirmev  IV (intra-op)*   Induction: Intravenous  PONV Risk Score and Plan: 2 and Ondansetron , Dexamethasone  and Treatment may vary due to age or medical condition  Airway Management Planned: Oral ETT  Additional Equipment: Arterial line  Intra-op Plan:   Post-operative Plan: Extubation in OR  Informed Consent: I have reviewed the patients History and Physical, chart, labs and discussed the procedure including the risks, benefits and alternatives for the proposed anesthesia with the patient or authorized representative who has indicated his/her understanding and acceptance.     Dental advisory given  Plan Discussed with: CRNA  Anesthesia Plan Comments:          Anesthesia Quick Evaluation

## 2024-01-03 NOTE — Brief Op Note (Signed)
 01/03/2024  3:42 PM  PATIENT:  Lance [name]  58 y.o. male  PRE-OPERATIVE DIAGNOSIS:  MEDIASTINAL ADENOPATHY  POST-OPERATIVE DIAGNOSIS:  MEDIASTINAL ADENOPATHY- Metastatic carcinoma  PROCEDURE:  Procedure(s): MEDIASTINOSCOPY (N/A)  SURGEON:  Surgeons and Role:    * Kerrin Elspeth BROCKS, MD - Primary  PHYSICIAN ASSISTANT:   ASSISTANTS: none   ANESTHESIA:   none  EBL:  10 mL   BLOOD ADMINISTERED:none  DRAINS: none   LOCAL MEDICATIONS USED:  NONE  SPECIMEN:  Source of Specimen:  4R lymph node  DISPOSITION OF SPECIMEN:  PATHOLOGY  COUNTS:  YES  TOURNIQUET:  * No tourniquets in log *  DICTATION: .Other Dictation: Dictation Number -  PLAN OF CARE: Discharge to home after PACU  PATIENT DISPOSITION:  PACU - hemodynamically stable.   Delay start of Pharmacological VTE agent (>24hrs) due to surgical blood loss or risk of bleeding: not applicable

## 2024-01-04 ENCOUNTER — Encounter (HOSPITAL_COMMUNITY): Payer: Self-pay | Admitting: Thoracic Surgery (Cardiothoracic Vascular Surgery)

## 2024-01-04 NOTE — Op Note (Signed)
 NAMEDWIGHT, ADAMCZAK MEDICAL RECORD NO: [id number] ACCOUNT NO: [id number] DATE OF BIRTH: [birth year] FACILITY: [facility] LOCATION: [facility] PHYSICIAN: Elspeth BROCKS. Kerrin, MD  Operative Report   DATE OF PROCEDURE: 01/03/2024  PREOPERATIVE DIAGNOSES: 1. Mediastinal adenopathy. 2. Multiple bone lesions.  POSTOPERATIVE DIAGNOSES: 1. Mediastinal adenopathy. 2. Multiple bone lesions. 3. Likely metastatic carcinoma.  PROCEDURE: Mediastinoscopy.  SURGEON: Elspeth BROCKS. Kerrin, MD.  ASSISTANT: None.  ANESTHESIA: General.  FINDINGS: Markedly enlarged level 4R node. Frozen section showed malignancy, likely carcinoma. Definitive diagnosis deferred to permanent pathology.  CLINICAL NOTE: Lance [name] is a 58 year old gentleman who initially presented with back pain. He was found to have pathologic fracture at L5, but at the time of surgery, pathology did not show any malignancy. He underwent endobronchial ultrasound which was also nondiagnostic. He was advised to undergo mediastinoscopy for definitive diagnosis. The indications, risks, benefits, and alternatives were discussed in detail with the patient. He understood and accepted the risks and agreed to proceed.  OPERATIVE NOTE: Lance [name] was brought to the operating room on 01/03/2024. He had induction of general anesthesia and was intubated. There was an arterial blood pressure monitor line in the right radial artery. Shoulder roll was placed. Sequential compression devices were placed on the calves for DVT prophylaxis. Intravenous antibiotics were administered. The neck and chest were prepped and draped in the usual sterile fashion.  A time-out was performed. A transverse incision was made one fingerbreadth above the sternal notch. It was carried through the skin and subcutaneous tissue. The strap muscles were separated in the midline. The pretracheal fascia was identified and incised and the pretracheal plane was developed bluntly into the  mediastinum. The mediastinoscope was inserted and the 4R station revealed a markedly enlarged node approximately 2 cm in length and 1.5 cm in width.  It was grayish, whitish in color, and very  firm. This node corresponded with the hypermetabolic node seen on PET CT. Biopsies were taken and sent for frozen section. While awaiting those, multiple additional biopsies were taken. Over half the node was removed. These were all sent for permanent pathology. Frozen section returned showing malignancy, likely carcinoma. After doing the biopsies, the wound was packed with gauze. After 5 minutes, the gauze packing was removed. The scope was reinserted. There was no ongoing bleeding. Surgicel was placed at the site. The scope was withdrawn. The wound was closed in two layers. Dermabond was applied. The patient was extubated in the operating room and taken to the post-anesthetic care unit in good condition. All sponge, needle, and instrument counts were correct at the end of the procedure.    SUJ D: 01/03/2024 5:26:15 pm T: 01/04/2024 3:13:00 am  JOB: [id number]/ [id number]

## 2024-01-10 ENCOUNTER — Encounter: Payer: Self-pay | Admitting: *Deleted

## 2024-01-10 NOTE — Progress Notes (Signed)
 Reviewed path with Dr Timmy. Per Dr Timmy, request for Foundation One testing sent on specimen [id number] DOS 01/03/2024.  Dr Timmy would like to see the patient to discuss results on Monday. Called and spoke to patient. [name] aware of appointment date, time and location.   Oncology Nurse Navigator Documentation     01/10/2024   11:00 AM  Oncology Nurse Navigator Flowsheets  Confirmed Diagnosis Date 01/03/2024  Navigator Follow Up Date: 01/14/2024  Navigator Follow Up Reason: Follow-up After Biopsy  Navigator Location [facility]  Navigator Encounter Type Molecular Studies;Pathology Review;Telephone  Telephone Outgoing Call  Treatment Phase Pre-Tx/Tx Discussion  Barriers/Navigation Needs Coordination of Care;Education  Education Other  Interventions Coordination of Care;Education  Acuity Level 2-Minimal Needs (1-2 Barriers Identified)  Coordination of Care Appts;Pathology  Education Method Verbal  Support Groups/Services Friends and Family  Time Spent with Patient 45

## 2024-01-14 ENCOUNTER — Inpatient Hospital Stay (HOSPITAL_BASED_OUTPATIENT_CLINIC_OR_DEPARTMENT_OTHER): Admitting: Hematology & Oncology

## 2024-01-14 ENCOUNTER — Inpatient Hospital Stay: Attending: Hematology & Oncology

## 2024-01-14 ENCOUNTER — Other Ambulatory Visit: Payer: Self-pay

## 2024-01-14 ENCOUNTER — Encounter: Payer: Self-pay | Admitting: Hematology & Oncology

## 2024-01-14 ENCOUNTER — Encounter: Payer: Self-pay | Admitting: *Deleted

## 2024-01-14 VITALS — BP 135/96 | HR 97 | Temp 97.7°F | Resp 18 | Ht 68.0 in | Wt 158.0 lb

## 2024-01-14 DIAGNOSIS — C089 Malignant neoplasm of major salivary gland, unspecified: Secondary | ICD-10-CM

## 2024-01-14 DIAGNOSIS — R590 Localized enlarged lymph nodes: Secondary | ICD-10-CM | POA: Diagnosis not present

## 2024-01-14 DIAGNOSIS — M899 Disorder of bone, unspecified: Secondary | ICD-10-CM | POA: Diagnosis not present

## 2024-01-14 DIAGNOSIS — Z79899 Other long term (current) drug therapy: Secondary | ICD-10-CM | POA: Insufficient documentation

## 2024-01-14 DIAGNOSIS — C801 Malignant (primary) neoplasm, unspecified: Secondary | ICD-10-CM

## 2024-01-14 LAB — CMP (CANCER CENTER ONLY)
ALT: 83 U/L — ABNORMAL HIGH (ref 0–44)
AST: 41 U/L (ref 15–41)
Albumin: 4.6 g/dL (ref 3.5–5.0)
Alkaline Phosphatase: 143 U/L — ABNORMAL HIGH (ref 38–126)
Anion gap: 11 (ref 5–15)
BUN: 11 mg/dL (ref 6–20)
CO2: 26 mmol/L (ref 22–32)
Calcium: 10.1 mg/dL (ref 8.9–10.3)
Chloride: 103 mmol/L (ref 98–111)
Creatinine: 0.78 mg/dL (ref 0.61–1.24)
GFR, Estimated: >60 mL/min (ref >60)
Glucose, Bld: 105 mg/dL — ABNORMAL HIGH (ref 70–99)
Potassium: 4.8 mmol/L (ref 3.5–5.1)
Sodium: 139 mmol/L (ref 135–145)
Total Bilirubin: 0.9 mg/dL (ref 0.0–1.2)
Total Protein: 7 g/dL (ref 6.5–8.1)

## 2024-01-14 LAB — CBC WITH DIFFERENTIAL (CANCER CENTER ONLY)
Abs Immature Granulocytes: 0.02 K/uL (ref 0.00–0.07)
Basophils Absolute: 0 K/uL (ref 0.0–0.1)
Basophils Relative: 1 %
Eosinophils Absolute: 0.1 K/uL (ref 0.0–0.5)
Eosinophils Relative: 1 %
HCT: 42 % (ref 39.0–52.0)
Hemoglobin: 14.1 g/dL (ref 13.0–17.0)
Immature Granulocytes: 0 %
Lymphocytes Relative: 25 %
Lymphs Abs: 1.4 K/uL (ref 0.7–4.0)
MCH: 30.3 pg (ref 26.0–34.0)
MCHC: 33.6 g/dL (ref 30.0–36.0)
MCV: 90.1 fL (ref 80.0–100.0)
Monocytes Absolute: 0.4 K/uL (ref 0.1–1.0)
Monocytes Relative: 7 %
Neutro Abs: 3.8 K/uL (ref 1.7–7.7)
Neutrophils Relative %: 66 %
Platelet Count: 308 K/uL (ref 150–400)
RBC: 4.66 MIL/uL (ref 4.22–5.81)
RDW: 13 % (ref 11.5–15.5)
WBC Count: 5.7 K/uL (ref 4.0–10.5)
nRBC: 0 % (ref 0.0–0.2)

## 2024-01-14 LAB — MAGNESIUM: Magnesium: 2.2 mg/dL (ref 1.7–2.4)

## 2024-01-14 LAB — PREALBUMIN: Prealbumin: 22 mg/dL (ref 18–38)

## 2024-01-14 LAB — LACTATE DEHYDROGENASE: LDH: 146 U/L (ref 98–192)

## 2024-01-14 MED ORDER — TEMAZEPAM 15 MG PO CAPS: 15.0000 mg | ORAL_CAPSULE | Freq: Every evening | ORAL | 0 refills | Status: DC | PRN

## 2024-01-14 MED ORDER — BICALUTAMIDE 50 MG PO TABS: 50.0000 mg | ORAL_TABLET | Freq: Every day | ORAL | 12 refills | Status: AC

## 2024-01-14 MED ORDER — DRONABINOL 5 MG PO CAPS: 5.0000 mg | ORAL_CAPSULE | Freq: Two times a day (BID) | ORAL | 0 refills | Status: DC

## 2024-01-14 NOTE — Progress Notes (Signed)
 Hematology and Oncology Follow Up Visit  Lance [name] [id number] 06/18/1965 58 y.o. 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  This is his second office visit.  We first saw him back on 11/21/2023.  At that time, he presented with him pathologic fracture at L5.  He was seen by Dr. Beuford of spinal surgery.  He did undergo a kyphoplasty at L5.  Unfortunately, biopsies were negative for any obvious malignancy.  We then got a PET scan on him.  This was done on 12/04/2023.  This showed multifocal metabolic bone lesions.  Mostly were in the spine but there is some rib lesions.  He had multiple hypermetabolic lymph nodes in the chest.  There were 2 hypermetabolic splenic lesions.  He then underwent a bronchoscopy.  This was done on think 12/18/2023.  The pathology report ([id number]) was negative for any malignancy.  Finally, we had Dr. Kerrin of thoracic surgery do a mediastinoscopy.  This was done on 01/03/2024.  The pathology report ([id number]) showed metastatic carcinoma favoring a salivary duct carcinoma.  This was strongly positive for androgen receptor.  We did send off molecular analysis but this is not yet back.  He will need radiotherapy.  I spoke with Dr. Shannon of Radiation Oncology.  He will see Lance [name] for radiotherapy to the lumbar spine.  Given the fact that he does have the positive androgen receptor, there have been studies with salivary duct carcinomas that have utilized antiandrogen therapy.  I think this would be a very reasonable way to go for Lance [name]..  I think that put him on Lupron  and Casodex  would not be a bad idea.  I know that his tumor markers are all normal so the only way we can really follow him is with our  scans.  [name] not sleeping well.  Is not eating well.  I will send in some medication for him to help sleep and for little Marinol  to help him eat.  He has had no change in bowel or bladder habits.  He has had no cough.  Has had no rashes.  There is been no leg swelling.  He has had no bleeding.  Currently, I would have to say that his performance status is probably ECOG 1.  Medications:  Current Outpatient Medications:    ALPRAZolam (XANAX) 0.25 MG tablet, Take 0.25 mg by mouth at bedtime as needed for sleep., Disp: , Rfl:    ALPRAZolam (XANAX) 0.5 MG tablet, Take 0.5 mg by mouth 2 (two) times daily as needed for anxiety., Disp: , Rfl:    cyclobenzaprine  (FLEXERIL ) 5 MG tablet, Take 1 tablet (5 mg total) by mouth 3 (three) times daily as needed for muscle spasms., Disp: 30 tablet, Rfl: 0   levothyroxine (SYNTHROID) 100 MCG tablet, Take 100 mcg by mouth daily., Disp: , Rfl:    lisinopril (ZESTRIL) 5 MG tablet, Take 5 mg by mouth daily., Disp: , Rfl:    zolpidem  (AMBIEN ) 10 MG tablet, Take 10 mg by mouth at bedtime., Disp: , Rfl:   Allergies:  Allergies  Allergen Reactions   Methimazole  Other (See Comments)    WBC drops   Methocarbamol Other (See Comments)    WBC drops  Past Medical History, Surgical history, Social history, and Family History were reviewed and updated.  Review of Systems: Review of Systems  Constitutional:  Positive for appetite change, fatigue and unexpected weight change.  HENT:  Negative.    Eyes: Negative.   Respiratory:  Positive for shortness of breath.   Cardiovascular: Negative.   Gastrointestinal:  Positive for nausea.  Endocrine: Negative.   Genitourinary: Negative.    Musculoskeletal:  Positive for back pain and myalgias.  Skin: Negative.   Neurological: Negative.   Hematological: Negative.   Psychiatric/Behavioral: Negative.      Physical Exam:  height is 5' 8 (1.727 m) and weight is 158 lb (71.7 kg). His oral temperature is 97.7 F (36.5  C). His blood pressure is 135/96 (abnormal) and his pulse is 97. His respiration is 18 and oxygen saturation is 100%.   Wt Readings from Last 3 Encounters:  01/14/24 158 lb (71.7 kg)  01/03/24 155 lb (70.3 kg)  01/01/24 160 lb 4.8 oz (72.7 kg)    Physical Exam Vitals reviewed.  HENT:     Head: Normocephalic and atraumatic.  Eyes:     Pupils: Pupils are equal, round, and reactive to light.  Cardiovascular:     Rate and Rhythm: Normal rate and regular rhythm.     Heart sounds: Normal heart sounds.  Pulmonary:     Effort: Pulmonary effort is normal.     Breath sounds: Normal breath sounds.  Abdominal:     General: Bowel sounds are normal.     Palpations: Abdomen is soft.  Musculoskeletal:        General: No tenderness or deformity. Normal range of motion.     Cervical back: Normal range of motion.  Lymphadenopathy:     Cervical: No cervical adenopathy.  Skin:    General: Skin is warm and dry.     Findings: No erythema or rash.  Neurological:     Mental Status: [name] alert and oriented to person, place, and time.  Psychiatric:        Behavior: Behavior normal.        Thought Content: Thought content normal.        Judgment: Judgment normal.      Lab Results  Component Value Date   WBC 5.7 01/14/2024   HGB 14.1 01/14/2024   HCT 42.0 01/14/2024   MCV 90.1 01/14/2024   PLT 308 01/14/2024     Chemistry      Component Value Date/Time   NA 139 01/14/2024 1045   K 4.8 01/14/2024 1045   CL 103 01/14/2024 1045   CO2 26 01/14/2024 1045   BUN 11 01/14/2024 1045   CREATININE 0.78 01/14/2024 1045      Component Value Date/Time   CALCIUM 10.1 01/14/2024 1045   ALKPHOS 143 (H) 01/14/2024 1045   AST 41 01/14/2024 1045   ALT 83 (H) 01/14/2024 1045   BILITOT 0.9 01/14/2024 1045      Impression and Plan: Lance [name] is a very nice 58 year old white male.  He comes in with his wife.  I must say that this is a very unusual cancer.  I know our pathologists are very thorough  with their immunohistochemical studies.  Again, this tumor is strongly positive for androgen receptor.  I think it be worthwhile to treat him with antiandrogen therapy.  I know there have been studies looking at salivary duct carcinomas with antiandrogen therapy with some good results.  I talked to he and his wife.  I told him that this is stage IV disease and that we can certainly treat this and not cure this.  Our goal is to try to treated and hopefully try to get it regressed so that he will have a good quality of life so he will not be in pain and he will be able to work.  He will definitely need radiotherapy.  Hopefully he can get that started this week.  He will need bisphosphonate therapy.  I will send in the Casodex  for him.  And we will see about getting the Lupron  set up for him this week.  I will plan to see him back in about a month.  We will probably do our next set of scans I would think given November.   Maude JONELLE Crease, MD 8/25/20251:47 PM

## 2024-01-14 NOTE — Progress Notes (Unsigned)
 Patient comes in with his wife to discuss treatment now that path has resulted.   Wife brings in forms for cancer policy and FMLA. Provided them with two ROIs and the intake form. They will complete these and leave them with the desk RN to submit to forms team.   Dr Timmy is recommending radiation with antiandrogen therapy. Treatment plan placed and will be scheduled once auth received. Scheduled to start this Friday.   Oncology Nurse Navigator Documentation     01/14/2024   11:00 AM  Oncology Nurse Navigator Flowsheets  Navigator Follow Up Date: 01/18/2024  Navigator Follow Up Reason: Chemotherapy  Navigator Location [facility]  Navigator Encounter Type Follow-up Appt  Treatment Phase Pre-Tx/Tx Discussion  Barriers/Navigation Needs Coordination of Care;Education  Education Other  Interventions Education;Referrals;Psycho-Social Support;Disability/FMLA  Acuity Level 2-Minimal Needs (1-2 Barriers Identified)  Referrals Radiation Oncology  Coordination of Care Other  Education Method Verbal  Support Groups/Services Friends and Family  Time Spent with Patient 30

## 2024-01-15 ENCOUNTER — Ambulatory Visit

## 2024-01-15 ENCOUNTER — Encounter: Payer: Self-pay | Admitting: Hematology & Oncology

## 2024-01-15 VITALS — BP 131/84 | HR 108 | Resp 18 | Ht 68.0 in | Wt 160.0 lb

## 2024-01-15 DIAGNOSIS — Z9889 Other specified postprocedural states: Secondary | ICD-10-CM

## 2024-01-15 DIAGNOSIS — Z5189 Encounter for other specified aftercare: Secondary | ICD-10-CM

## 2024-01-15 DIAGNOSIS — R590 Localized enlarged lymph nodes: Secondary | ICD-10-CM

## 2024-01-15 NOTE — Progress Notes (Signed)
      [address] [name] [id number]             [phone]       HPI: Mr. Lance [name] is a 58 year old man with medical history of hyperthyroidism, mediastinal adenopathy, multiple bone lesions, and salivary duct carcinoma who returns for routine postoperative follow-up having undergone mediastinoscopy with lymph node biopsy on 01/03/2024 with Dr. Kerrin.  After the procedure he was discharged the same day.  Since hospital discharge the patient reports that he has been doing well.  He has be seen by oncology and was given the pathology report. His incision has been healing well.  He has not had any incisional pain.    Current Outpatient Medications  Medication Sig Dispense Refill   ALPRAZolam (XANAX) 0.25 MG tablet Take 0.25 mg by mouth at bedtime as needed for sleep.     ALPRAZolam (XANAX) 0.5 MG tablet Take 0.5 mg by mouth 2 (two) times daily as needed for anxiety.     bicalutamide  (CASODEX ) 50 MG tablet Take 1 tablet (50 mg total) by mouth daily. 30 tablet 12   cyclobenzaprine  (FLEXERIL ) 5 MG tablet Take 1 tablet (5 mg total) by mouth 3 (three) times daily as needed for muscle spasms. 30 tablet 0   dronabinol  (MARINOL ) 5 MG capsule Take 1 capsule (5 mg total) by mouth 2 (two) times daily before lunch and supper. 60 capsule 0   levothyroxine (SYNTHROID) 100 MCG tablet Take 100 mcg by mouth daily.     lisinopril (ZESTRIL) 5 MG tablet Take 5 mg by mouth daily.     temazepam  (RESTORIL ) 15 MG capsule Take 1 capsule (15 mg total) by mouth at bedtime as needed for sleep. 30 capsule 0   zolpidem  (AMBIEN ) 10 MG tablet Take 10 mg by mouth at bedtime.     No current facility-administered medications for this visit.   Vitals:   01/15/24 1521  BP: 131/84  Pulse: (!) 108  Resp: 18  SpO2: 97%    Review of Systems  Respiratory: Negative.  Negative for cough and shortness of breath.   Cardiovascular: Negative.  Negative for chest pain and leg swelling.    Physical Exam Constitutional:      Appearance: Normal appearance.  HENT:     Head: Normocephalic and atraumatic.  Skin:    General: Skin is warm and dry.      Neurological:     General: No focal deficit present.     Mental Status: [name] alert and oriented to person, place, and time.      Diagnostic Tests: N/A   Plan:  S/P lymph node biopsy and Visit for wound check -Incision site healing well.  [name] to continue to keep area clean and dry.  Do not use antibiotic cream or Vaseline. He can use tylenol  or advil  for pain if needed. Alarm symptoms discussed and if he starts to have spreading erythema from incision, warmth, tenderness, fever, chill or purulent drainage then he should be evaluated -Continue follow up with oncology as scheduled -Follow up with [facility] as needed     Manuelita CHRISTELLA Rough, PA-C [facility] [phone]

## 2024-01-15 NOTE — Patient Instructions (Signed)
-  Follow up with oncology as scheduled -Follow up with [facility] as needed

## 2024-01-17 LAB — FUNGUS CULTURE RESULT

## 2024-01-17 LAB — FUNGUS CULTURE WITH STAIN

## 2024-01-17 LAB — FUNGAL ORGANISM REFLEX

## 2024-01-18 ENCOUNTER — Ambulatory Visit

## 2024-01-18 ENCOUNTER — Encounter: Payer: Self-pay | Admitting: *Deleted

## 2024-01-18 VITALS — BP 138/82 | HR 99 | Temp 97.9°F | Resp 20

## 2024-01-18 DIAGNOSIS — C801 Malignant (primary) neoplasm, unspecified: Secondary | ICD-10-CM | POA: Diagnosis not present

## 2024-01-18 DIAGNOSIS — C089 Malignant neoplasm of major salivary gland, unspecified: Secondary | ICD-10-CM | POA: Diagnosis not present

## 2024-01-18 DIAGNOSIS — M899 Disorder of bone, unspecified: Secondary | ICD-10-CM | POA: Diagnosis not present

## 2024-01-18 DIAGNOSIS — Z79899 Other long term (current) drug therapy: Secondary | ICD-10-CM | POA: Diagnosis not present

## 2024-01-18 LAB — FUNGUS CULTURE WITH STAIN

## 2024-01-18 LAB — FUNGAL ORGANISM REFLEX

## 2024-01-18 LAB — FUNGUS CULTURE RESULT

## 2024-01-18 MED ORDER — LEUPROLIDE ACETATE 7.5 MG ~~LOC~~ KIT
7.5000 mg | PACK | Freq: Once | SUBCUTANEOUS | Status: DC
Start: 2024-01-18 — End: 2024-01-18

## 2024-01-18 MED ORDER — LEUPROLIDE ACETATE (3 MONTH) 22.5 MG ~~LOC~~ KIT
22.5000 mg | PACK | Freq: Once | SUBCUTANEOUS | Status: AC
Start: 2024-01-18 — End: 2024-01-18
  Administered 2024-01-18: 22.5 mg via SUBCUTANEOUS
  Filled 2024-01-18: qty 22.5

## 2024-01-18 MED ORDER — DENOSUMAB 120 MG/1.7ML ~~LOC~~ SOLN
120.0000 mg | Freq: Once | SUBCUTANEOUS | Status: AC
  Administered 2024-01-18: 120 mg via SUBCUTANEOUS
  Filled 2024-01-18: qty 1.7

## 2024-01-18 NOTE — Progress Notes (Addendum)
 Histology and Location of Primary Cancer:   Sites of Visceral and Bony Metastatic Disease: Lumbar Spine  Location(s) of Symptomatic Metastases:   Past/Anticipated chemotherapy by medical oncology, if any:    Pain on a scale of 0-10 is: 0    If Spine Met(s), symptoms, if any, include: Bowel/Bladder retention or incontinence (please describe): No Numbness or weakness in extremities (please describe): No Current Decadron  regimen, if applicable: No  Ambulatory status? Walker? Wheelchair?: Ambulatory  SAFETY ISSUES: Prior radiation? Yes, patient took oral radiation around 2015 for thyroid  cancer. Pacemaker/ICD? no Possible current pregnancy? no Is the patient on methotrexate? no  Current Complaints / other details:    BP (!) 120/99 (BP Location: Left Arm)   Pulse (!) 106   Temp (!) 97.3 F (36.3 C) (Temporal)   Resp 18   Ht 5' 8 (1.727 m)   Wt 158 lb 6 oz (71.8 kg)   SpO2 99%   BMI 24.08 kg/m

## 2024-01-18 NOTE — Patient Instructions (Signed)
 Denosumab Injection (Oncology) What is this medication? DENOSUMAB (den oh SUE mab) prevents weakened bones caused by cancer. It may also be used to treat noncancerous bone tumors that cannot be removed by surgery. It can also be used to treat high calcium levels in the blood caused by cancer. It works by blocking a protein that causes bones to break down quickly. This slows down the release of calcium from bones, which lowers calcium levels in your blood. It also makes your bones stronger and less likely to break (fracture). This medicine may be used for other purposes; ask your health care provider or pharmacist if you have questions. COMMON BRAND NAME(S): XGEVA What should I tell my care team before I take this medication? They need to know if you have any of these conditions: Dental disease Having surgery or tooth extraction Infection Kidney disease Low levels of calcium or vitamin D in the blood Malnutrition On hemodialysis Skin conditions or sensitivity Thyroid or parathyroid disease An unusual reaction to denosumab, other medications, foods, dyes, or preservatives Pregnant or trying to get pregnant Breast-feeding How should I use this medication? This medication is for injection under the skin. It is given by your care team in a hospital or clinic setting. A special MedGuide will be given to you before each treatment. Be sure to read this information carefully each time. Talk to your care team about the use of this medication in children. While it may be prescribed for children as young as 13 years for selected conditions, precautions do apply. Overdosage: If you think you have taken too much of this medicine contact a poison control center or emergency room at once. NOTE: This medicine is only for you. Do not share this medicine with others. What if I miss a dose? Keep appointments for follow-up doses. It is important not to miss your dose. Call your care team if you are unable to  keep an appointment. What may interact with this medication? Do not take this medication with any of the following: Other medications containing denosumab This medication may also interact with the following: Medications that lower your chance of fighting infection Steroid medications, such as prednisone or cortisone This list may not describe all possible interactions. Give your health care provider a list of all the medicines, herbs, non-prescription drugs, or dietary supplements you use. Also tell them if you smoke, drink alcohol, or use illegal drugs. Some items may interact with your medicine. What should I watch for while using this medication? Your condition will be monitored carefully while you are receiving this medication. You may need blood work while taking this medication. This medication may increase your risk of getting an infection. Call your care team for advice if you get a fever, chills, sore throat, or other symptoms of a cold or flu. Do not treat yourself. Try to avoid being around people who are sick. You should make sure you get enough calcium and vitamin D while you are taking this medication, unless your care team tells you not to. Discuss the foods you eat and the vitamins you take with your care team. Some people who take this medication have severe bone, joint, or muscle pain. This medication may also increase your risk for jaw problems or a broken thigh bone. Tell your care team right away if you have severe pain in your jaw, bones, joints, or muscles. Tell your care team if you have any pain that does not go away or that gets worse. Talk  to your care team if you may be pregnant. Serious birth defects can occur if you take this medication during pregnancy and for 5 months after the last dose. You will need a negative pregnancy test before starting this medication. Contraception is recommended while taking this medication and for 5 months after the last dose. Your care team  can help you find the option that works for you. What side effects may I notice from receiving this medication? Side effects that you should report to your care team as soon as possible: Allergic reactions--skin rash, itching, hives, swelling of the face, lips, tongue, or throat Bone, joint, or muscle pain Low calcium level--muscle pain or cramps, confusion, tingling, or numbness in the hands or feet Osteonecrosis of the jaw--pain, swelling, or redness in the mouth, numbness of the jaw, poor healing after dental work, unusual discharge from the mouth, visible bones in the mouth Side effects that usually do not require medical attention (report to your care team if they continue or are bothersome): Cough Diarrhea Fatigue Headache Nausea This list may not describe all possible side effects. Call your doctor for medical advice about side effects. You may report side effects to FDA at 1-800-FDA-1088. Where should I keep my medication? This medication is given in a hospital or clinic. It will not be stored at home. NOTE: This sheet is a summary. It may not cover all possible information. If you have questions about this medicine, talk to your doctor, pharmacist, or health care provider.  2024 Elsevier/Gold Standard (2021-09-28 00:00:00) Leuprolide Solution for Injection What is this medication? LEUPROLIDE (loo PROE lide) reduces the symptoms of prostate cancer. It works by decreasing levels of the hormone testosterone in the body. This prevents prostate cancer cells from spreading or growing. This medicine may be used for other purposes; ask your health care provider or pharmacist if you have questions. COMMON BRAND NAME(S): Lupron What should I tell my care team before I take this medication? They need to know if you have any of these conditions: Diabetes Heart attack Heart disease High blood pressure High cholesterol Pain or difficulty passing urine Spinal cord  metastasis Stroke Tobacco use An unusual or allergic reaction to leuprolide, other medications, foods, dyes, or preservatives Pregnant or trying to get pregnant Breast-feeding How should I use this medication? This medication is for injection under the skin or into a muscle. You will be taught how to prepare and give this medication. Use exactly as directed. Take your medication at regular intervals. Do not take it more often than directed. It is important that you put your used needles and syringes in a special sharps container. Do not put them in a trash can. If you do not have a sharps container, call your care team to get one. A special MedGuide will be given to you by the pharmacist with each prescription and refill. Be sure to read this information carefully each time. Talk to your care team about the use of this medication in children. While this medication may be prescribed for children as young as 8 years for selected conditions, precautions do apply. Overdosage: If you think you have taken too much of this medicine contact a poison control center or emergency room at once. NOTE: This medicine is only for you. Do not share this medicine with others. What if I miss a dose? If you miss a dose, take it as soon as you can. If it is almost time for your next dose, take only that  dose. Do not take double or extra doses. What may interact with this medication? Do not take this medication with any of the following: Chasteberry Cisapride Dronedarone Pimozide Thioridazine This medication may also interact with the following: Estrogen or progestin hormones Herbal or dietary supplements, like black cohosh or DHEA Other medications that cause heart rhythm changes Testosterone This list may not describe all possible interactions. Give your health care provider a list of all the medicines, herbs, non-prescription drugs, or dietary supplements you use. Also tell them if you smoke, drink alcohol,  or use illegal drugs. Some items may interact with your medicine. What should I watch for while using this medication? Visit your care team for regular checks on your progress. During the first week, your symptoms may get worse, but then will improve as you continue your treatment. You may get hot flashes, increased bone pain, increased difficulty passing urine, or an aggravation of nerve symptoms. Discuss these effects with your care team, some of them may improve with continued use of this medication. Patients may experience a menstrual cycle or spotting during the first 2 months of therapy with this medication. If this continues, contact your care team. This medication may increase blood sugar. The risk may be higher in patients who already have diabetes. Ask your care team what you can do to lower your risk of diabetes while taking this medication. What side effects may I notice from receiving this medication? Side effects that you should report to your care team as soon as possible: Allergic reactions--skin rash, itching, hives, swelling of the face, lips, tongue, or throat Heart attack--pain or tightness in the chest, shoulders, arms, or jaw, nausea, shortness of breath, cold or clammy skin, feeling faint or lightheaded Heart rhythm changes--fast or irregular heartbeat, dizziness, feeling faint or lightheaded, chest pain, trouble breathing High blood sugar (hyperglycemia)--increased thirst or amount of urine, unusual weakness or fatigue, blurry vision New or worsening seizures Redness, blistering, peeling, or loosening of the skin, including inside the mouth Stroke--sudden numbness or weakness of the face, arm, or leg, trouble speaking, confusion, trouble walking, loss of balance or coordination, dizziness, severe headache, change in vision Swelling and pain of the tumor site or lymph nodes Side effects that usually do not require medical attention (report these to your care team if they  continue or are bothersome): Change in sex drive or performance Hot flashes Joint pain Pain, redness, or irritation at injection site Swelling of the ankles, hands, or feet Unusual weakness or fatigue This list may not describe all possible side effects. Call your doctor for medical advice about side effects. You may report side effects to FDA at 1-800-FDA-1088. Where should I keep my medication? Keep out of the reach of children and pets. Store below 25 degrees C (77 degrees F). Do not freeze. Protect from light. Get rid of any unused medication after the expiration date. To get rid of medications that are no longer needed or have expired: Take the medication to a medication take-back program. Check with your pharmacy or law enforcement to find a location. If you cannot return the medication, ask your pharmacist or care team how to get rid of this medication safely. NOTE: This sheet is a summary. It may not cover all possible information. If you have questions about this medicine, talk to your doctor, pharmacist, or health care provider.  2024 Elsevier/Gold Standard (2023-04-20 00:00:00)

## 2024-01-18 NOTE — Progress Notes (Signed)
 Patient is here to start ADT. He has an appointment on 01/23/2024 to discuss radiation.   Spoke with patient and he is ready to get treatment started. He does ask about a potential reaction between his casodex  and Xanax. Reached out to [facility] and received the following:  There is an interaction, the Casodex  can increase the concentration or the Xanax, no Baseline dose adjustment is needed he should just monitor for increased effects of the Xanax, extra tired, hungover feeling after taking etc. If he is really worried he could just take half a Xanax the first time he takes it on the Casodex  to see how he feels, but I think he will be fine!   This is shared with the patient. [name] appreciative of the information.   Oncology Nurse Navigator Documentation     01/18/2024   10:15 AM  Oncology Nurse Navigator Flowsheets  Planned Course of Treatment Hormonal Therapy;Radiation  Phase of Treatment Hormonal Therapy  Hormonal Actual Start Date: 01/11/2024  Navigator Follow Up Date: 02/11/2024  Navigator Follow Up Reason: Follow-up Appointment  Navigator Location [facility]  Navigator Encounter Type Treatment  Treatment Initiated Date 01/18/2024  Patient Visit Type MedOnc  Treatment Phase Active Tx  Barriers/Navigation Needs Coordination of Care;Education  Education Other  Interventions Education;Coordination of Care;Psycho-Social Support  Acuity Level 2-Minimal Needs (1-2 Barriers Identified)  Coordination of Care Other  Education Method Verbal  Support Groups/Services Friends and Family  Time Spent with Patient 30

## 2024-01-22 NOTE — Progress Notes (Signed)
 Radiation Oncology         [phone] ________________________________  Initial {In/Out}patient Consultation  Name: Lance [name] MRN: [id number]  Date: 01/23/2024  DOB: March 24, 1966  CC:Larnell Hamilton, MD  Timmy Maude SAUNDERS, MD   REFERRING PHYSICIAN: Timmy Maude SAUNDERS, MD  DIAGNOSIS: There were no encounter diagnoses.  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 male who is accompanied by ***. he is seen as a courtesy of Dr. Timmy for an opinion concerning radiation therapy as part of management for his metastatic carcinoma- salivary duct primary.   The patient presented to Dr. Timmy on 11/21/23 with complains of severe back pain.  Prior to his visit, he had an MRI showing replacement of marrow at L4 and L5 and a pathologic fracture of 50% and L5. Scan also noted small lesions at T12 and L1. To further investigate his symptoms, Dr. Timmy recommended he undergo a biopsy along with kyphoplasty.   Subsequently, he underwent a kyphoplasty at L5 on 12/12/23 under Dr. Beuford. Surgical pathology however was negative for any malignancy.   Therefore, he underwent a PET scan on 12/04/23 showing multifocal metabolic bone lesions mostly in the spine but there is some rib lesions. Most intense area is involving the L5 vertebral body with lucency and sclerosis and partial compression. Scan also noted Several hypermetabolic lymph nodes identified in the thorax including paratracheal and prevascular as well as 2 nodes at the right thoracic inlet along with two hypermetabolic splenic lesions.  In light of findings, he then underwent a bronchoscopy with EBUS on 12/18/23 under the care of Dr. Isadora. Once again, biopsy of chest lesion were negative for any malignancy.   Subsequently, he was referred to Dr. Kerrin of thoracic who performed a mediastinoscopy and lymph node excision on 01/03/2024. Surgical pathology showed metastatic  carcinoma favoring a salivary duct carcinoma. Immunohistochemical stains were performed to characterize the tumor cells. The cells are strongly and diffusely positive for CK AE1/AE3, CK7, GATA3 and Androgen receptor (AR), and weakly positive for CD138. was strongly positive for androgen receptor. Tumor is also positive for Her2 (3+).     His most recent visit with Dr. Timmy on 01/17/24, he recommended radiation therapy and bisphosphonate therapy. Casodex  and lupron  will also be initiated.   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    PAST SURGICAL HISTORY: Past Surgical History:  Procedure Laterality Date   CLAVICLE SURGERY Left 1980s   has plates and pins   ENDOBRONCHIAL ULTRASOUND Bilateral 12/18/2023   Procedure: ENDOBRONCHIAL ULTRASOUND (EBUS);  Surgeon: Isadora Hose, MD;  Location: [facility] ORS;  Service: Pulmonary;  Laterality: Bilateral;   FRACTURE SURGERY     I & D EXTREMITY Right 11/04/2016   Procedure: IRRIGATION AND DEBRIDEMENT INDEX FINGER AND PINNING;  Surgeon: Shari Easter, MD;  Location: [facility] OR;  Service: Orthopedics;  Laterality: Right;   KYPHOPLASTY N/A 12/12/2023   Procedure: LUMBAR FIVE KYPHOPLASTY WITH LUMBAR FIVE LESION ABLASION;  Surgeon: Beuford Anes, MD;  Location:  [facility] OR;  Service: Orthopedics;  Laterality: N/A;   LITHOTRIPSY  1990s   MEDIASTINOSCOPY N/A 01/03/2024   Procedure: MEDIASTINOSCOPY;  Surgeon: Kerrin Elspeth BROCKS, MD;  Location: [facility];  Service: Thoracic;  Laterality: N/A;    FAMILY HISTORY:  Family History  Problem Relation Age of Onset   Sleep apnea Mother    Bladder  Cancer Father    Hypertension Father    Colon cancer Neg Hx    Esophageal cancer Neg Hx    Prostate cancer Neg Hx    Rectal cancer Neg Hx    Stomach cancer Neg Hx    Thyroid  disease Neg Hx    Migraines Neg Hx    Headache Neg Hx     SOCIAL HISTORY:  Social History   Tobacco Use   Smoking status: Former    Current packs/day: 0.00    Average packs/day: 0.5 packs/day for 20.0 years (10.0 ttl pk-yrs)    Types: Cigarettes    Start date: 09/20/1994    Quit date: 09/20/2014    Years since quitting: 9.3    Passive exposure: Never   Smokeless tobacco: Former    Types: Chew   Tobacco comments:    stopped chewing the 1990s. Quit smoking cigarettes in 2015.  Vaping Use   Vaping status: Never Used  Substance Use Topics   Alcohol  use: No    Alcohol /week: 0.0 standard drinks of alcohol    Drug use: No    ALLERGIES:  Allergies  Allergen Reactions   Methimazole  Other (See Comments)    WBC drops   Methocarbamol Other (See Comments)    WBC drops    MEDICATIONS:  Current Outpatient Medications  Medication Sig Dispense Refill   ALPRAZolam (XANAX) 0.25 MG tablet Take 0.25 mg by mouth at bedtime as needed for sleep.     ALPRAZolam (XANAX) 0.5 MG tablet Take 0.5 mg by mouth 2 (two) times daily as needed for anxiety.     bicalutamide  (CASODEX ) 50 MG tablet Take 1 tablet (50 mg total) by mouth daily. 30 tablet 12   cyclobenzaprine  (FLEXERIL ) 5 MG tablet Take 1 tablet (5 mg total) by mouth 3 (three) times daily as needed for muscle spasms. 30 tablet 0   dronabinol  (MARINOL ) 5 MG capsule Take 1 capsule (5 mg total) by mouth 2 (two) times daily before lunch and supper. 60 capsule 0   levothyroxine (SYNTHROID) 100 MCG tablet Take 100 mcg by mouth daily.     lisinopril (ZESTRIL) 5 MG tablet Take 5 mg by mouth daily.     temazepam  (RESTORIL ) 15 MG capsule Take 1 capsule (15 mg total) by mouth at bedtime as needed for sleep. 30 capsule 0   zolpidem  (AMBIEN ) 10 MG tablet Take 10 mg by mouth at  bedtime.     No current facility-administered medications for this encounter.    REVIEW OF SYSTEMS:  A 10+ POINT REVIEW OF SYSTEMS WAS OBTAINED including neurology, dermatology, psychiatry, cardiac, respiratory, lymph, extremities, GI, GU, musculoskeletal, constitutional, reproductive, HEENT. ***   PHYSICAL EXAM:  vitals were not taken for this visit.   General: Alert and oriented, in no acute distress HEENT: Head is normocephalic. Extraocular movements are intact. Oropharynx is clear. Neck: Neck is supple, no palpable cervical or supraclavicular lymphadenopathy. Heart: Regular in rate and rhythm with no murmurs, rubs, or gallops. Chest: Clear to auscultation bilaterally, with no rhonchi, wheezes, or rales. Abdomen: Soft, nontender, nondistended, with no rigidity or guarding. Extremities: No cyanosis or edema. Lymphatics: see Neck Exam  Skin: No concerning lesions. Musculoskeletal: symmetric strength and muscle tone throughout. Neurologic: Cranial nerves II through XII are grossly intact. No obvious focalities. Speech is fluent. Coordination is intact. Psychiatric: Judgment and insight are intact. Affect is appropriate. ***  ECOG = ***  0 - Asymptomatic (Fully active, able to carry on all predisease activities without restriction)  1 - Symptomatic but completely ambulatory (Restricted in physically strenuous activity but ambulatory and able to carry out work of a light or sedentary nature. For example, light housework, office work)  2 - Symptomatic, <50% in bed during the day (Ambulatory and capable of all self care but unable to carry out any work activities. Up and about more than 50% of waking hours)  3 - Symptomatic, >50% in bed, but not bedbound (Capable of only limited self-care, confined to bed or chair 50% or more of waking hours)  4 - Bedbound (Completely disabled. Cannot carry on any self-care. Totally confined to bed or chair)  5 - Death   Raylene MM, Creech RH, Tormey DC,  et al. [phone]). Toxicity and response criteria of the [facility]. Am. DOROTHA Bridges. Oncol. 5 (6): 649-55  LABORATORY DATA:  Lab Results  Component Value Date   WBC 5.7 01/14/2024   HGB 14.1 01/14/2024   HCT 42.0 01/14/2024   MCV 90.1 01/14/2024   PLT 308 01/14/2024   NEUTROABS 3.8 01/14/2024   Lab Results  Component Value Date   NA 139 01/14/2024   K 4.8 01/14/2024   CL 103 01/14/2024   CO2 26 01/14/2024   GLUCOSE 105 (H) 01/14/2024   BUN 11 01/14/2024   CREATININE 0.78 01/14/2024   CALCIUM 10.1 01/14/2024      RADIOGRAPHY: DG Chest 2 View Result Date: 01/03/2024 CLINICAL DATA:  Preop exam.  Mediastinal adenopathy. EXAM: CHEST - 2 VIEW COMPARISON:  Chest x-ray 7 02/09/2024 and CT 11/22/2023, PET-CT 12/10/2023 FINDINGS: Lungs are adequately inflated and otherwise clear. Cardiomediastinal silhouette and remainder of the exam is unchanged. IMPRESSION: No active cardiopulmonary disease. Electronically Signed   By: Toribio Agreste M.D.   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  We discussed the natural history of *** and general treatment, highlighting the role of radiotherapy in the management.  We discussed the available radiation techniques, and focused on the details of logistics and delivery.  We reviewed the anticipated acute and late sequelae associated with radiation in this setting.  The patient was encouraged to ask questions that I answered to the best of my ability. *** A patient consent form was discussed and signed.  We retained a copy for our records.  The patient would like to proceed with radiation and will be scheduled for CT simulation.  PLAN: ***    *** minutes of total time was spent for this patient encounter, including preparation, face-to-face counseling with the patient and coordination of care,  physical exam, and documentation of the encounter.   ------------------------------------------------  Lynwood CHARM Nasuti, PhD, MD  This document serves as a record of services personally performed by Lynwood Nasuti, MD. It was created on his behalf by Reymundo Cartwright, a trained medical scribe. The creation of this record is based on the scribe's personal observations and the provider's statements to them. This document has been checked and approved by the attending provider.

## 2024-01-23 ENCOUNTER — Encounter: Payer: Self-pay | Admitting: Radiation Oncology

## 2024-01-23 ENCOUNTER — Ambulatory Visit
Admission: RE | Admit: 2024-01-23 | Discharge: 2024-01-23 | Disposition: A | Discharge status: Home / Self Care | Source: Ambulatory Visit | Attending: Radiation Oncology | Admitting: Radiation Oncology

## 2024-01-23 ENCOUNTER — Encounter: Payer: Self-pay | Admitting: Hematology & Oncology

## 2024-01-23 VITALS — BP 120/99 | HR 106 | Temp 97.3°F | Resp 18 | Ht 68.0 in | Wt 158.4 lb

## 2024-01-23 DIAGNOSIS — Z87891 Personal history of nicotine dependence: Secondary | ICD-10-CM | POA: Insufficient documentation

## 2024-01-23 DIAGNOSIS — Z87442 Personal history of urinary calculi: Secondary | ICD-10-CM | POA: Insufficient documentation

## 2024-01-23 DIAGNOSIS — Z79899 Other long term (current) drug therapy: Secondary | ICD-10-CM | POA: Diagnosis not present

## 2024-01-23 DIAGNOSIS — D709 Neutropenia, unspecified: Secondary | ICD-10-CM | POA: Diagnosis not present

## 2024-01-23 DIAGNOSIS — D72819 Decreased white blood cell count, unspecified: Secondary | ICD-10-CM | POA: Diagnosis not present

## 2024-01-23 DIAGNOSIS — E039 Hypothyroidism, unspecified: Secondary | ICD-10-CM | POA: Diagnosis not present

## 2024-01-23 DIAGNOSIS — Z800 Family history of malignant neoplasm of digestive organs: Secondary | ICD-10-CM | POA: Diagnosis not present

## 2024-01-23 DIAGNOSIS — C7951 Secondary malignant neoplasm of bone: Secondary | ICD-10-CM | POA: Insufficient documentation

## 2024-01-23 DIAGNOSIS — C089 Malignant neoplasm of major salivary gland, unspecified: Secondary | ICD-10-CM

## 2024-01-23 DIAGNOSIS — Z8052 Family history of malignant neoplasm of bladder: Secondary | ICD-10-CM | POA: Diagnosis not present

## 2024-01-23 DIAGNOSIS — M549 Dorsalgia, unspecified: Secondary | ICD-10-CM | POA: Diagnosis not present

## 2024-01-23 DIAGNOSIS — Z79890 Hormone replacement therapy (postmenopausal): Secondary | ICD-10-CM | POA: Diagnosis not present

## 2024-01-25 ENCOUNTER — Ambulatory Visit
Admission: RE | Admit: 2024-01-25 | Discharge: 2024-01-25 | Disposition: A | Discharge status: Home / Self Care | Source: Ambulatory Visit | Attending: Radiation Oncology | Admitting: Radiation Oncology

## 2024-01-25 DIAGNOSIS — N281 Cyst of kidney, acquired: Secondary | ICD-10-CM | POA: Diagnosis not present

## 2024-01-25 DIAGNOSIS — Z510 Encounter for antineoplastic radiation therapy: Secondary | ICD-10-CM | POA: Insufficient documentation

## 2024-01-25 DIAGNOSIS — C7951 Secondary malignant neoplasm of bone: Secondary | ICD-10-CM | POA: Diagnosis not present

## 2024-01-25 DIAGNOSIS — C801 Malignant (primary) neoplasm, unspecified: Secondary | ICD-10-CM | POA: Insufficient documentation

## 2024-01-25 DIAGNOSIS — K529 Noninfective gastroenteritis and colitis, unspecified: Secondary | ICD-10-CM | POA: Insufficient documentation

## 2024-01-25 DIAGNOSIS — C089 Malignant neoplasm of major salivary gland, unspecified: Secondary | ICD-10-CM | POA: Diagnosis not present

## 2024-01-25 DIAGNOSIS — Z79899 Other long term (current) drug therapy: Secondary | ICD-10-CM | POA: Insufficient documentation

## 2024-01-25 DIAGNOSIS — Z87891 Personal history of nicotine dependence: Secondary | ICD-10-CM | POA: Insufficient documentation

## 2024-01-25 DIAGNOSIS — R935 Abnormal findings on diagnostic imaging of other abdominal regions, including retroperitoneum: Secondary | ICD-10-CM | POA: Diagnosis not present

## 2024-01-28 DIAGNOSIS — C089 Malignant neoplasm of major salivary gland, unspecified: Secondary | ICD-10-CM | POA: Diagnosis not present

## 2024-01-28 DIAGNOSIS — C7951 Secondary malignant neoplasm of bone: Secondary | ICD-10-CM | POA: Diagnosis not present

## 2024-01-29 ENCOUNTER — Encounter: Payer: Self-pay | Admitting: *Deleted

## 2024-01-29 ENCOUNTER — Ambulatory Visit
Admission: RE | Admit: 2024-01-29 | Discharge: 2024-01-29 | Disposition: A | Discharge status: Home / Self Care | Source: Ambulatory Visit | Attending: Radiation Oncology | Admitting: Radiation Oncology

## 2024-01-29 ENCOUNTER — Other Ambulatory Visit: Payer: Self-pay

## 2024-01-29 DIAGNOSIS — R590 Localized enlarged lymph nodes: Secondary | ICD-10-CM

## 2024-01-29 DIAGNOSIS — C801 Malignant (primary) neoplasm, unspecified: Secondary | ICD-10-CM

## 2024-01-29 DIAGNOSIS — C7951 Secondary malignant neoplasm of bone: Secondary | ICD-10-CM | POA: Diagnosis not present

## 2024-01-29 DIAGNOSIS — Z510 Encounter for antineoplastic radiation therapy: Secondary | ICD-10-CM | POA: Diagnosis not present

## 2024-01-29 DIAGNOSIS — C089 Malignant neoplasm of major salivary gland, unspecified: Secondary | ICD-10-CM

## 2024-01-29 LAB — RAD ONC ARIA SESSION SUMMARY
Course Elapsed Days: 0
Plan Fractions Treated to Date: 1
Plan Prescribed Dose Per Fraction: 3 Gy
Plan Total Fractions Prescribed: 10
Plan Total Prescribed Dose: 30 Gy
Reference Point Dosage Given to Date: 3 Gy
Reference Point Session Dosage Given: 3 Gy
Session Number: 1

## 2024-01-29 NOTE — Progress Notes (Signed)
 Referral for nutrition and social work placed per protocol.   Oncology Nurse Navigator Documentation     01/29/2024    2:00 PM  Oncology Nurse Navigator Flowsheets  Phase of Treatment Radiation  Radiation Actual Start Date: 01/29/2024  Radiation Expected End Date: 02/11/2024  Navigator Follow Up Date: 02/11/2024  Navigator Follow Up Reason: Follow-up Appointment  Navigator Location [facility]  Navigator Encounter Type Appt/Treatment Plan Review  Patient Visit Type MedOnc  Treatment Phase Active Tx  Barriers/Navigation Needs Coordination of Care;Education  Interventions Referrals  Acuity Level 2-Minimal Needs (1-2 Barriers Identified)  Referrals Medical Oncology;Social Work  Support Groups/Services Friends and Family  Time Spent with Patient 15

## 2024-01-30 ENCOUNTER — Other Ambulatory Visit: Payer: Self-pay

## 2024-01-30 ENCOUNTER — Ambulatory Visit
Admission: RE | Admit: 2024-01-30 | Discharge: 2024-01-30 | Disposition: A | Discharge status: Home / Self Care | Source: Ambulatory Visit | Attending: Radiation Oncology | Admitting: Radiation Oncology

## 2024-01-30 DIAGNOSIS — Z510 Encounter for antineoplastic radiation therapy: Secondary | ICD-10-CM | POA: Diagnosis not present

## 2024-01-30 DIAGNOSIS — C089 Malignant neoplasm of major salivary gland, unspecified: Secondary | ICD-10-CM | POA: Diagnosis not present

## 2024-01-30 DIAGNOSIS — C7951 Secondary malignant neoplasm of bone: Secondary | ICD-10-CM | POA: Diagnosis not present

## 2024-01-30 LAB — RAD ONC ARIA SESSION SUMMARY
Course Elapsed Days: 1
Plan Fractions Treated to Date: 2
Plan Prescribed Dose Per Fraction: 3 Gy
Plan Total Fractions Prescribed: 10
Plan Total Prescribed Dose: 30 Gy
Reference Point Dosage Given to Date: 6 Gy
Reference Point Session Dosage Given: 3 Gy
Session Number: 2

## 2024-01-31 ENCOUNTER — Ambulatory Visit
Admission: RE | Admit: 2024-01-31 | Discharge: 2024-01-31 | Disposition: A | Discharge status: Home / Self Care | Source: Ambulatory Visit | Attending: Radiation Oncology | Admitting: Radiation Oncology

## 2024-01-31 ENCOUNTER — Other Ambulatory Visit: Payer: Self-pay

## 2024-01-31 DIAGNOSIS — C089 Malignant neoplasm of major salivary gland, unspecified: Secondary | ICD-10-CM | POA: Diagnosis not present

## 2024-01-31 DIAGNOSIS — C7951 Secondary malignant neoplasm of bone: Secondary | ICD-10-CM | POA: Diagnosis not present

## 2024-01-31 DIAGNOSIS — Z510 Encounter for antineoplastic radiation therapy: Secondary | ICD-10-CM | POA: Diagnosis not present

## 2024-01-31 LAB — RAD ONC ARIA SESSION SUMMARY
Course Elapsed Days: 2
Plan Fractions Treated to Date: 3
Plan Prescribed Dose Per Fraction: 3 Gy
Plan Total Fractions Prescribed: 10
Plan Total Prescribed Dose: 30 Gy
Reference Point Dosage Given to Date: 9 Gy
Reference Point Session Dosage Given: 3 Gy
Session Number: 3

## 2024-01-31 LAB — ACID FAST CULTURE WITH REFLEXED SENSITIVITIES (MYCOBACTERIA)
Acid Fast Culture: NEGATIVE
Acid Fast Culture: NEGATIVE

## 2024-02-01 ENCOUNTER — Ambulatory Visit
Admission: RE | Admit: 2024-02-01 | Discharge: 2024-02-01 | Disposition: A | Discharge status: Home / Self Care | Source: Ambulatory Visit | Attending: Radiation Oncology | Admitting: Radiation Oncology

## 2024-02-01 ENCOUNTER — Other Ambulatory Visit: Payer: Self-pay

## 2024-02-01 ENCOUNTER — Telehealth: Payer: Self-pay

## 2024-02-01 DIAGNOSIS — C089 Malignant neoplasm of major salivary gland, unspecified: Secondary | ICD-10-CM | POA: Diagnosis not present

## 2024-02-01 DIAGNOSIS — C7951 Secondary malignant neoplasm of bone: Secondary | ICD-10-CM | POA: Diagnosis not present

## 2024-02-01 DIAGNOSIS — Z510 Encounter for antineoplastic radiation therapy: Secondary | ICD-10-CM | POA: Diagnosis not present

## 2024-02-01 LAB — RAD ONC ARIA SESSION SUMMARY
Course Elapsed Days: 3
Plan Fractions Treated to Date: 4
Plan Prescribed Dose Per Fraction: 3 Gy
Plan Total Fractions Prescribed: 10
Plan Total Prescribed Dose: 30 Gy
Reference Point Dosage Given to Date: 12 Gy
Reference Point Session Dosage Given: 3 Gy
Session Number: 4

## 2024-02-01 NOTE — Telephone Encounter (Signed)
 Clinical Social Work was referred by medical provider for assessment of psychosocial needs.  CSW attempted to contact patient by phone.  Left voicemail with contact information and request for return call.

## 2024-02-04 ENCOUNTER — Ambulatory Visit
Admission: RE | Admit: 2024-02-04 | Discharge: 2024-02-04 | Disposition: A | Discharge status: Home / Self Care | Source: Ambulatory Visit | Attending: Radiation Oncology | Admitting: Radiation Oncology

## 2024-02-04 ENCOUNTER — Other Ambulatory Visit: Payer: Self-pay

## 2024-02-04 ENCOUNTER — Telehealth: Payer: Self-pay

## 2024-02-04 DIAGNOSIS — C089 Malignant neoplasm of major salivary gland, unspecified: Secondary | ICD-10-CM | POA: Diagnosis not present

## 2024-02-04 DIAGNOSIS — Z510 Encounter for antineoplastic radiation therapy: Secondary | ICD-10-CM | POA: Diagnosis not present

## 2024-02-04 DIAGNOSIS — C7951 Secondary malignant neoplasm of bone: Secondary | ICD-10-CM | POA: Diagnosis not present

## 2024-02-04 LAB — RAD ONC ARIA SESSION SUMMARY
Course Elapsed Days: 6
Plan Fractions Treated to Date: 5
Plan Prescribed Dose Per Fraction: 3 Gy
Plan Total Fractions Prescribed: 10
Plan Total Prescribed Dose: 30 Gy
Reference Point Dosage Given to Date: 15 Gy
Reference Point Session Dosage Given: 3 Gy
Session Number: 5

## 2024-02-04 LAB — SURGICAL PATHOLOGY

## 2024-02-04 NOTE — Telephone Encounter (Signed)
 Patient called in to report having bad headaches since starting radiation treatment. Patient is currently receiving treatment  to his lumbar spine. 4 of 10. Reports taking extra strength Tylenol  with no relief. Asking for recommendations for medications to relieve headaches.

## 2024-02-05 ENCOUNTER — Other Ambulatory Visit: Payer: Self-pay

## 2024-02-05 ENCOUNTER — Inpatient Hospital Stay

## 2024-02-05 ENCOUNTER — Ambulatory Visit
Admission: RE | Admit: 2024-02-05 | Discharge: 2024-02-05 | Disposition: A | Discharge status: Home / Self Care | Source: Ambulatory Visit | Attending: Radiation Oncology | Admitting: Radiation Oncology

## 2024-02-05 ENCOUNTER — Other Ambulatory Visit: Payer: Self-pay | Admitting: Radiation Oncology

## 2024-02-05 DIAGNOSIS — Z79899 Other long term (current) drug therapy: Secondary | ICD-10-CM | POA: Insufficient documentation

## 2024-02-05 DIAGNOSIS — C7951 Secondary malignant neoplasm of bone: Secondary | ICD-10-CM | POA: Diagnosis not present

## 2024-02-05 DIAGNOSIS — Z923 Personal history of irradiation: Secondary | ICD-10-CM | POA: Insufficient documentation

## 2024-02-05 DIAGNOSIS — C089 Malignant neoplasm of major salivary gland, unspecified: Secondary | ICD-10-CM | POA: Diagnosis not present

## 2024-02-05 DIAGNOSIS — M899 Disorder of bone, unspecified: Secondary | ICD-10-CM | POA: Insufficient documentation

## 2024-02-05 DIAGNOSIS — Z510 Encounter for antineoplastic radiation therapy: Secondary | ICD-10-CM | POA: Diagnosis not present

## 2024-02-05 DIAGNOSIS — Z79890 Hormone replacement therapy (postmenopausal): Secondary | ICD-10-CM | POA: Insufficient documentation

## 2024-02-05 DIAGNOSIS — K529 Noninfective gastroenteritis and colitis, unspecified: Secondary | ICD-10-CM | POA: Insufficient documentation

## 2024-02-05 DIAGNOSIS — C801 Malignant (primary) neoplasm, unspecified: Secondary | ICD-10-CM | POA: Insufficient documentation

## 2024-02-05 LAB — RAD ONC ARIA SESSION SUMMARY
Course Elapsed Days: 7
Plan Fractions Treated to Date: 6
Plan Prescribed Dose Per Fraction: 3 Gy
Plan Total Fractions Prescribed: 10
Plan Total Prescribed Dose: 30 Gy
Reference Point Dosage Given to Date: 18 Gy
Reference Point Session Dosage Given: 3 Gy
Session Number: 6

## 2024-02-05 MED ORDER — ONDANSETRON HCL 4 MG PO TABS: 4.0000 mg | ORAL_TABLET | Freq: Three times a day (TID) | ORAL | 0 refills | Status: AC | PRN

## 2024-02-05 MED ORDER — TRAMADOL HCL 50 MG PO TABS: 50.0000 mg | ORAL_TABLET | Freq: Four times a day (QID) | ORAL | 0 refills | Status: DC | PRN

## 2024-02-05 MED ORDER — ALPRAZOLAM 0.5 MG PO TABS: 0.5000 mg | ORAL_TABLET | Freq: Two times a day (BID) | ORAL | 0 refills | Status: AC | PRN

## 2024-02-05 NOTE — Progress Notes (Signed)
 [facility] Clinical Social Work  Clinical Social Work was referred by Statistician for assessment of psychosocial needs.  Clinical Social Worker contacted patient by phone to offer support and assess for needs.     Interventions: Patient stated he was not feeling well and had recently returned from the MD's office.  He said he is scheduled for an MRI tomorrow.  CSW offered to phone patient at another time when he was feeling better and he agreed.      Follow Up Plan:  CSW will follow-up with patient by phone     Macario CHRISTELLA Au, LCSW  Clinical Social Worker [facility]

## 2024-02-06 ENCOUNTER — Other Ambulatory Visit: Payer: Self-pay

## 2024-02-06 ENCOUNTER — Ambulatory Visit (HOSPITAL_COMMUNITY)
Admission: RE | Admit: 2024-02-06 | Discharge: 2024-02-06 | Disposition: A | Discharge status: Home / Self Care | Source: Ambulatory Visit | Attending: Radiation Oncology | Admitting: Radiation Oncology

## 2024-02-06 ENCOUNTER — Inpatient Hospital Stay: Admitting: Dietician

## 2024-02-06 ENCOUNTER — Ambulatory Visit
Admission: RE | Admit: 2024-02-06 | Discharge: 2024-02-06 | Disposition: A | Discharge status: Home / Self Care | Source: Ambulatory Visit | Attending: Radiation Oncology | Admitting: Radiation Oncology

## 2024-02-06 DIAGNOSIS — C089 Malignant neoplasm of major salivary gland, unspecified: Secondary | ICD-10-CM | POA: Diagnosis not present

## 2024-02-06 DIAGNOSIS — C7951 Secondary malignant neoplasm of bone: Secondary | ICD-10-CM | POA: Diagnosis not present

## 2024-02-06 DIAGNOSIS — C801 Malignant (primary) neoplasm, unspecified: Secondary | ICD-10-CM | POA: Diagnosis not present

## 2024-02-06 DIAGNOSIS — Z510 Encounter for antineoplastic radiation therapy: Secondary | ICD-10-CM | POA: Diagnosis not present

## 2024-02-06 DIAGNOSIS — G9389 Other specified disorders of brain: Secondary | ICD-10-CM | POA: Diagnosis not present

## 2024-02-06 LAB — RAD ONC ARIA SESSION SUMMARY
Course Elapsed Days: 8
Plan Fractions Treated to Date: 7
Plan Prescribed Dose Per Fraction: 3 Gy
Plan Total Fractions Prescribed: 10
Plan Total Prescribed Dose: 30 Gy
Reference Point Dosage Given to Date: 21 Gy
Reference Point Session Dosage Given: 3 Gy
Session Number: 7

## 2024-02-06 MED ORDER — GADOBUTROL 1 MMOL/ML IV SOLN
7.0000 mL | Freq: Once | INTRAVENOUS | Status: AC | PRN
Start: 2024-02-06 — End: 2024-02-06
  Administered 2024-02-06: 7 mL via INTRAVENOUS

## 2024-02-07 ENCOUNTER — Inpatient Hospital Stay: Admitting: Dietician

## 2024-02-07 ENCOUNTER — Ambulatory Visit

## 2024-02-07 ENCOUNTER — Other Ambulatory Visit: Payer: Self-pay

## 2024-02-07 ENCOUNTER — Ambulatory Visit
Admission: RE | Admit: 2024-02-07 | Discharge: 2024-02-07 | Disposition: A | Discharge status: Home / Self Care | Source: Ambulatory Visit | Attending: Radiation Oncology | Admitting: Radiation Oncology

## 2024-02-07 DIAGNOSIS — C089 Malignant neoplasm of major salivary gland, unspecified: Secondary | ICD-10-CM | POA: Diagnosis not present

## 2024-02-07 DIAGNOSIS — C7951 Secondary malignant neoplasm of bone: Secondary | ICD-10-CM | POA: Diagnosis not present

## 2024-02-07 DIAGNOSIS — Z510 Encounter for antineoplastic radiation therapy: Secondary | ICD-10-CM | POA: Diagnosis not present

## 2024-02-07 LAB — RAD ONC ARIA SESSION SUMMARY
Course Elapsed Days: 9
Plan Fractions Treated to Date: 8
Plan Prescribed Dose Per Fraction: 3 Gy
Plan Total Fractions Prescribed: 10
Plan Total Prescribed Dose: 30 Gy
Reference Point Dosage Given to Date: 24 Gy
Reference Point Session Dosage Given: 3 Gy
Session Number: 8

## 2024-02-07 NOTE — Progress Notes (Signed)
 Nutrition Assessment: Reached out to patient at home telephone number.    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 [name] receiving radiation to his bone mets which will be completed next week.  Dr. Timmy plans to treat with Xgeva , Lupron  and Casodex  and rescan. Starting to eat a bit more. Denies any pain with eating, some nausea lately with changes in blood pressure. Usual PO : Breakfast: oatmeal, or eggs and sausage or toast Lunch: sandwich or hot dog  Dinner: wife cooks, Hydrologist, chicken, fish sometimes Little fruit sometimes banana or fruit in jello,  Fluids: water 36 oz, rare to drink soda 5-6oz, rare to drink juice, no milk, no alcohol    Nutrition Focused Physical Exam: unable to perform NFPE   Medications: Marinol  ordered  but he hasn't started yet  Labs:  Reviewed 01/14/24   Anthropometrics:  15# weight loss over past year.  Started in January feels may be r/t stress  Height: 68 Weight: 158.4# UBW: 160-165# BMI: 24.08   Estimated Energy Needs  Kcals: 2200 Protein: 86-108 g Fluid: 2.5L   NUTRITION DIAGNOSIS: Food and Nutrition Related Knowledge Deficit related to cancer and associated treatments as evidenced by no prior need for nutrition related information.   INTERVENTION:   Relayed that nutrition services are wrap around service provided at no charge and encouraged continued communication if experiencing any nutritional impact symptoms (NIS). Educated on importance of adequate nourishment with calorie and protein energy intake with nutrient dense foods when possible to maintain weight/strength and QOL.    Encouraged MVI daily to improved micronutrient profile with current limited variety. Discussed some oral nutrition supplements and which to use as meal  replacement if he doesn't want to eat (350-550 calories 20-30g pro)  Relayed fluid goal and encouraged increased to 80oz consider milk or substitute daily, juices broths to aid in weight maintenance.  Encouraged weekly weights at home.  Discussed strategies for his HTN, encouraged consideration of DASH diet emailed link contact information provided.  MONITORING, EVALUATION, GOAL: weight trends, nutrition impact symptoms, PO intake, labs  Weight maintenance  Next Visit: PRN at patient or provider request.  Micheline Craven, RDN, LDN Registered Dietitian, [facility] Part Time Remote (Usual office hours: Tuesday-Thursday) Mobile: [phone]

## 2024-02-08 ENCOUNTER — Ambulatory Visit
Admission: RE | Admit: 2024-02-08 | Discharge: 2024-02-08 | Disposition: A | Discharge status: Home / Self Care | Source: Ambulatory Visit | Attending: Radiation Oncology | Admitting: Radiation Oncology

## 2024-02-08 ENCOUNTER — Other Ambulatory Visit: Payer: Self-pay

## 2024-02-08 ENCOUNTER — Other Ambulatory Visit: Payer: Self-pay | Admitting: Hematology & Oncology

## 2024-02-08 ENCOUNTER — Encounter: Payer: Self-pay | Admitting: *Deleted

## 2024-02-08 DIAGNOSIS — C089 Malignant neoplasm of major salivary gland, unspecified: Secondary | ICD-10-CM | POA: Diagnosis not present

## 2024-02-08 DIAGNOSIS — Z510 Encounter for antineoplastic radiation therapy: Secondary | ICD-10-CM | POA: Diagnosis not present

## 2024-02-08 DIAGNOSIS — C7951 Secondary malignant neoplasm of bone: Secondary | ICD-10-CM | POA: Diagnosis not present

## 2024-02-08 LAB — RAD ONC ARIA SESSION SUMMARY
Course Elapsed Days: 10
Plan Fractions Treated to Date: 9
Plan Prescribed Dose Per Fraction: 3 Gy
Plan Total Fractions Prescribed: 10
Plan Total Prescribed Dose: 30 Gy
Reference Point Dosage Given to Date: 27 Gy
Reference Point Session Dosage Given: 3 Gy
Session Number: 9

## 2024-02-08 MED ORDER — DEXAMETHASONE 4 MG PO TABS: 20.0000 mg | ORAL_TABLET | Freq: Two times a day (BID) | ORAL | 2 refills | Status: DC

## 2024-02-08 MED ORDER — AMLODIPINE BESYLATE 5 MG PO TABS: 5.0000 mg | ORAL_TABLET | Freq: Every day | ORAL | 6 refills | Status: AC

## 2024-02-08 MED ORDER — PANTOPRAZOLE SODIUM 40 MG PO TBEC: 40.0000 mg | DELAYED_RELEASE_TABLET | Freq: Two times a day (BID) | ORAL | 5 refills | Status: AC

## 2024-02-08 NOTE — Progress Notes (Signed)
 Patient is calling with complaints of neck pain and increased BP. He states he started to have pain in his neck about a week after he received his first shot and thought he just strained it, but the pain has continued without improvement. R side of neck to shoulder. No pain down the arm. No weakness. He's taking tramadol  without great improvement. His BP has also been elevated for about a week. 150-160/100. He's been having some headaches.   The above reviewed with Dr Timmy. He will call the patient and speak to them about recent MRI findings and current symptoms.   Oncology Nurse Navigator Documentation     02/08/2024    3:00 PM  Oncology Nurse Navigator Flowsheets  Navigator Follow Up Date: 02/11/2024  Navigator Follow Up Reason: Follow-up Appointment  Navigator Location [facility]  Navigator Encounter Type Telephone  Telephone Symptom Mgt;Incoming Call  Patient Visit Type MedOnc  Treatment Phase Active Tx  Barriers/Navigation Needs Coordination of Care;Education  Education Pain/ Symptom Management  Interventions Coordination of Care;Education  Acuity Level 2-Minimal Needs (1-2 Barriers Identified)  Coordination of Care Other  Education Method Verbal  Support Groups/Services Friends and Family  Time Spent with Patient 30

## 2024-02-11 ENCOUNTER — Other Ambulatory Visit: Payer: Self-pay

## 2024-02-11 ENCOUNTER — Ambulatory Visit
Admission: RE | Admit: 2024-02-11 | Discharge: 2024-02-11 | Disposition: A | Discharge status: Home / Self Care | Source: Ambulatory Visit | Attending: Radiation Oncology | Admitting: Radiation Oncology

## 2024-02-11 ENCOUNTER — Ambulatory Visit

## 2024-02-11 ENCOUNTER — Encounter: Payer: Self-pay | Admitting: *Deleted

## 2024-02-11 ENCOUNTER — Encounter: Payer: Self-pay | Admitting: Hematology & Oncology

## 2024-02-11 ENCOUNTER — Inpatient Hospital Stay (HOSPITAL_BASED_OUTPATIENT_CLINIC_OR_DEPARTMENT_OTHER): Admitting: Hematology & Oncology

## 2024-02-11 ENCOUNTER — Inpatient Hospital Stay

## 2024-02-11 VITALS — BP 124/73 | HR 75 | Temp 97.6°F | Resp 16 | Ht 68.0 in | Wt 161.0 lb

## 2024-02-11 DIAGNOSIS — C7951 Secondary malignant neoplasm of bone: Secondary | ICD-10-CM | POA: Diagnosis not present

## 2024-02-11 DIAGNOSIS — C089 Malignant neoplasm of major salivary gland, unspecified: Secondary | ICD-10-CM | POA: Diagnosis not present

## 2024-02-11 DIAGNOSIS — Z510 Encounter for antineoplastic radiation therapy: Secondary | ICD-10-CM | POA: Diagnosis not present

## 2024-02-11 DIAGNOSIS — R590 Localized enlarged lymph nodes: Secondary | ICD-10-CM

## 2024-02-11 LAB — CBC WITH DIFFERENTIAL (CANCER CENTER ONLY)
Abs Immature Granulocytes: 0.09 K/uL — ABNORMAL HIGH (ref 0.00–0.07)
Basophils Absolute: 0 K/uL (ref 0.0–0.1)
Basophils Relative: 0 %
Eosinophils Absolute: 0 K/uL (ref 0.0–0.5)
Eosinophils Relative: 0 %
HCT: 40 % (ref 39.0–52.0)
Hemoglobin: 13.6 g/dL (ref 13.0–17.0)
Immature Granulocytes: 1 %
Lymphocytes Relative: 5 %
Lymphs Abs: 0.6 K/uL — ABNORMAL LOW (ref 0.7–4.0)
MCH: 30.8 pg (ref 26.0–34.0)
MCHC: 34 g/dL (ref 30.0–36.0)
MCV: 90.7 fL (ref 80.0–100.0)
Monocytes Absolute: 0.8 K/uL (ref 0.1–1.0)
Monocytes Relative: 7 %
Neutro Abs: 10.3 K/uL — ABNORMAL HIGH (ref 1.7–7.7)
Neutrophils Relative %: 87 %
Platelet Count: 270 K/uL (ref 150–400)
RBC: 4.41 MIL/uL (ref 4.22–5.81)
RDW: 13.3 % (ref 11.5–15.5)
WBC Count: 11.8 K/uL — ABNORMAL HIGH (ref 4.0–10.5)
nRBC: 0 % (ref 0.0–0.2)

## 2024-02-11 LAB — CMP (CANCER CENTER ONLY)
ALT: 92 U/L — ABNORMAL HIGH (ref 0–44)
AST: 41 U/L (ref 15–41)
Albumin: 4.5 g/dL (ref 3.5–5.0)
Alkaline Phosphatase: 94 U/L (ref 38–126)
Anion gap: 10 (ref 5–15)
BUN: 20 mg/dL (ref 6–20)
CO2: 26 mmol/L (ref 22–32)
Calcium: 8.8 mg/dL — ABNORMAL LOW (ref 8.9–10.3)
Chloride: 104 mmol/L (ref 98–111)
Creatinine: 0.71 mg/dL (ref 0.61–1.24)
GFR, Estimated: >60 mL/min (ref >60)
Glucose, Bld: 159 mg/dL — ABNORMAL HIGH (ref 70–99)
Potassium: 3.8 mmol/L (ref 3.5–5.1)
Sodium: 140 mmol/L (ref 135–145)
Total Bilirubin: 0.4 mg/dL (ref 0.0–1.2)
Total Protein: 6.5 g/dL (ref 6.5–8.1)

## 2024-02-11 LAB — RAD ONC ARIA SESSION SUMMARY
Course Elapsed Days: 13
Plan Fractions Treated to Date: 10
Plan Prescribed Dose Per Fraction: 3 Gy
Plan Total Fractions Prescribed: 10
Plan Total Prescribed Dose: 30 Gy
Reference Point Dosage Given to Date: 30 Gy
Reference Point Session Dosage Given: 3 Gy
Session Number: 10

## 2024-02-11 LAB — LACTATE DEHYDROGENASE: LDH: 142 U/L (ref 98–192)

## 2024-02-11 MED ORDER — DEXAMETHASONE 4 MG PO TABS: 20.0000 mg | ORAL_TABLET | Freq: Every day | ORAL | 2 refills | Status: AC

## 2024-02-11 NOTE — Progress Notes (Signed)
 Hematology and Oncology Follow Up Visit  Lance [name] [id number] 01/29/1966 58 y.o. 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  The new problem that he has now is that he was having pain in his neck.  Lance [name] of Radiation Oncology was incredibly astute and did a MRI of the brain.  This actually caught part of the cervical spine which showed a lesion at C2.  I suspect this is probably where the pain was coming from.  I put him on Decadron .  This is helped quite a bit.  As such, I suspect this is probably a real lesion.  [name] going to have radiotherapy to the cervical spine.  He has had hot flashes.  He has had his Lupron .  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  Again, his tumor is positive for the androgen receptor.  As such, we are utilizing antiandrogen therapy to try to help with his malignancy.  He has had no cough.  [name] not really able to work.  I think he probably needs to stay out of work for another month or so.  His appetite is okay.  He has had no nausea or vomiting.  He has had no obvious change in bowel or bladder habits.  Overall, I would have to say that his performance status is probably ECOG 1.     Medications:  Current Outpatient Medications:    ALPRAZolam  (XANAX ) 0.25 MG tablet, Take 0.25 mg by mouth at bedtime as needed for sleep., Disp: , Rfl:    ALPRAZolam  (XANAX ) 0.5 MG tablet, Take 1 tablet (0.5 mg total) by mouth 2 (two) times daily as needed for anxiety., Disp: 30 tablet, Rfl: 0   amLODipine  (NORVASC ) 5 MG tablet, Take 1 tablet (5 mg total) by mouth daily., Disp: 30 tablet, Rfl: 6   bicalutamide  (CASODEX ) 50 MG tablet, Take 1 tablet (50 mg total) by  mouth daily., Disp: 30 tablet, Rfl: 12   dexamethasone  (DECADRON ) 4 MG tablet, Take 5 tablets (20 mg total) by mouth daily. You must take this with food.  Preferably, take this in the morning with breakfast., Disp: 120 tablet, Rfl: 2   levothyroxine (SYNTHROID) 100 MCG tablet, Take 100 mcg by mouth daily., Disp: , Rfl:    ondansetron  (ZOFRAN ) 4 MG tablet, Take 1 tablet (4 mg total) by mouth every 8 (eight) hours as needed for nausea or vomiting., Disp: 20 tablet, Rfl: 0   pantoprazole  (PROTONIX ) 40 MG tablet, Take 1 tablet (40 mg total) by mouth 2 (two) times daily., Disp: 60 tablet, Rfl: 5   traMADol  (ULTRAM ) 50 MG tablet, Take 1 tablet (50 mg total) by mouth every 6 (six) hours as needed for moderate pain (pain score 4-6)., Disp: 30 tablet, Rfl: 0   zolpidem  (AMBIEN ) 10 MG tablet, Take 10 mg by mouth at bedtime., Disp: , Rfl:   Allergies:  Allergies  Allergen Reactions   Methimazole  Other (See Comments)    WBC drops   Methocarbamol Other (See Comments)    WBC drops    Past Medical History, Surgical history, Social history, and Family History were reviewed and updated.  Review of Systems: Review  of Systems  Constitutional:  Positive for appetite change, fatigue and unexpected weight change.  HENT:  Negative.    Eyes: Negative.   Respiratory:  Positive for shortness of breath.   Cardiovascular: Negative.   Gastrointestinal:  Positive for nausea.  Endocrine: Negative.   Genitourinary: Negative.    Musculoskeletal:  Positive for back pain and myalgias.  Skin: Negative.   Neurological: Negative.   Hematological: Negative.   Psychiatric/Behavioral: Negative.      Physical Exam:  height is 5' 8 (1.727 m) and weight is 161 lb (73 kg). His oral temperature is 97.6 F (36.4 C). His blood pressure is 124/73 and his pulse is 75. His respiration is 16 and oxygen saturation is 100%.   Wt Readings from Last 3 Encounters:  02/11/24 161 lb (73 kg)  01/23/24 158 lb 6 oz (71.8 kg)   01/15/24 160 lb (72.6 kg)    Physical Exam Vitals reviewed.  HENT:     Head: Normocephalic and atraumatic.  Eyes:     Pupils: Pupils are equal, round, and reactive to light.  Cardiovascular:     Rate and Rhythm: Normal rate and regular rhythm.     Heart sounds: Normal heart sounds.  Pulmonary:     Effort: Pulmonary effort is normal.     Breath sounds: Normal breath sounds.  Abdominal:     General: Bowel sounds are normal.     Palpations: Abdomen is soft.  Musculoskeletal:        General: No tenderness or deformity. Normal range of motion.     Cervical back: Normal range of motion.  Lymphadenopathy:     Cervical: No cervical adenopathy.  Skin:    General: Skin is warm and dry.     Findings: No erythema or rash.  Neurological:     Mental Status: [name] alert and oriented to person, place, and time.  Psychiatric:        Behavior: Behavior normal.        Thought Content: Thought content normal.        Judgment: Judgment normal.      Lab Results  Component Value Date   WBC 11.8 (H) 02/11/2024   HGB 13.6 02/11/2024   HCT 40.0 02/11/2024   MCV 90.7 02/11/2024   PLT 270 02/11/2024     Chemistry      Component Value Date/Time   NA 140 02/11/2024 1015   K 3.8 02/11/2024 1015   CL 104 02/11/2024 1015   CO2 26 02/11/2024 1015   BUN 20 02/11/2024 1015   CREATININE 0.71 02/11/2024 1015      Component Value Date/Time   CALCIUM 8.8 (L) 02/11/2024 1015   ALKPHOS 94 02/11/2024 1015   AST 41 02/11/2024 1015   ALT 92 (H) 02/11/2024 1015   BILITOT 0.4 02/11/2024 1015      Impression and Plan: Lance [name] is a very nice 58 year old white male.  He comes in with his wife.  I must say that this is a very unusual cancer.  I know our pathologists are very thorough with their immunohistochemical studies.  Again, this tumor is strongly positive for androgen receptor.  So, we are treating him with antiandrogen therapy.  I know there are reports that of show success with treating  these salivary duct carcinomas with antiandrogen therapy.  Hopefully, radiotherapy will help with his spine.  I know that Lance [name] is doing a great job with him.  I probably would not do another PET scan probably  until November.  I would like to see how the antiandrogen therapy works and also see how the radiotherapy works.  For right now, we will plan to get him back in 1 month.  I know he is trying hard.  I know this is a very challenging tumor to try to treat.    Maude JONELLE Crease, MD 9/22/202512:06 PM

## 2024-02-11 NOTE — Progress Notes (Unsigned)
 Patient here for follow up. After calling on Friday with neck pain, he start decadron  and a blood pressure med. Pain is improved today and BP is WNL. He will start radiation to his new C spine lesion.   Oncology Nurse Navigator Documentation     02/11/2024   10:30 AM  Oncology Nurse Navigator Flowsheets  Navigator Follow Up Date: 03/04/2024  Navigator Follow Up Reason: Follow-up Appointment  Navigator Location [facility]  Navigator Encounter Type Follow-up Appt  Patient Visit Type MedOnc  Treatment Phase Active Tx  Barriers/Navigation Needs Coordination of Care;Education  Interventions None Required  Acuity Level 2-Minimal Needs (1-2 Barriers Identified)  Support Groups/Services Friends and Family  Time Spent with Patient 15

## 2024-02-12 ENCOUNTER — Encounter: Payer: Self-pay | Admitting: Hematology & Oncology

## 2024-02-12 LAB — TESTOSTERONE: Testosterone: <3 ng/dL — ABNORMAL LOW (ref 264–916)

## 2024-02-13 ENCOUNTER — Ambulatory Visit
Admission: RE | Admit: 2024-02-13 | Discharge: 2024-02-13 | Disposition: A | Discharge status: Home / Self Care | Source: Ambulatory Visit | Attending: Radiation Oncology | Admitting: Radiation Oncology

## 2024-02-13 DIAGNOSIS — C089 Malignant neoplasm of major salivary gland, unspecified: Secondary | ICD-10-CM

## 2024-02-13 DIAGNOSIS — Z510 Encounter for antineoplastic radiation therapy: Secondary | ICD-10-CM | POA: Diagnosis not present

## 2024-02-13 DIAGNOSIS — C7951 Secondary malignant neoplasm of bone: Secondary | ICD-10-CM | POA: Diagnosis not present

## 2024-02-14 ENCOUNTER — Ambulatory Visit
Admission: RE | Admit: 2024-02-14 | Discharge: 2024-02-14 | Disposition: A | Discharge status: Home / Self Care | Source: Ambulatory Visit | Attending: Radiation Oncology | Admitting: Radiation Oncology

## 2024-02-14 ENCOUNTER — Other Ambulatory Visit: Payer: Self-pay

## 2024-02-14 DIAGNOSIS — C089 Malignant neoplasm of major salivary gland, unspecified: Secondary | ICD-10-CM

## 2024-02-14 DIAGNOSIS — Z510 Encounter for antineoplastic radiation therapy: Secondary | ICD-10-CM | POA: Diagnosis not present

## 2024-02-14 DIAGNOSIS — C7951 Secondary malignant neoplasm of bone: Secondary | ICD-10-CM | POA: Diagnosis not present

## 2024-02-14 LAB — RAD ONC ARIA SESSION SUMMARY
Course Elapsed Days: 16
Plan Fractions Treated to Date: 1
Plan Prescribed Dose Per Fraction: 3 Gy
Plan Total Fractions Prescribed: 10
Plan Total Prescribed Dose: 30 Gy
Reference Point Dosage Given to Date: 3 Gy
Reference Point Session Dosage Given: 3 Gy
Session Number: 11

## 2024-02-15 ENCOUNTER — Other Ambulatory Visit: Payer: Self-pay

## 2024-02-15 ENCOUNTER — Ambulatory Visit
Admission: RE | Admit: 2024-02-15 | Discharge: 2024-02-15 | Disposition: A | Discharge status: Home / Self Care | Source: Ambulatory Visit | Attending: Radiation Oncology | Admitting: Radiation Oncology

## 2024-02-15 DIAGNOSIS — C7951 Secondary malignant neoplasm of bone: Secondary | ICD-10-CM | POA: Diagnosis not present

## 2024-02-15 DIAGNOSIS — C089 Malignant neoplasm of major salivary gland, unspecified: Secondary | ICD-10-CM | POA: Diagnosis not present

## 2024-02-15 DIAGNOSIS — Z510 Encounter for antineoplastic radiation therapy: Secondary | ICD-10-CM | POA: Diagnosis not present

## 2024-02-15 LAB — RAD ONC ARIA SESSION SUMMARY
Course Elapsed Days: 17
Plan Fractions Treated to Date: 2
Plan Prescribed Dose Per Fraction: 3 Gy
Plan Total Fractions Prescribed: 10
Plan Total Prescribed Dose: 30 Gy
Reference Point Dosage Given to Date: 6 Gy
Reference Point Session Dosage Given: 3 Gy
Session Number: 12

## 2024-02-17 ENCOUNTER — Emergency Department (HOSPITAL_COMMUNITY)

## 2024-02-17 ENCOUNTER — Emergency Department (HOSPITAL_COMMUNITY)
Admission: EM | Admit: 2024-02-17 | Discharge: 2024-02-18 | Disposition: A | Discharge status: Home / Self Care | Source: Home / Self Care | Attending: Emergency Medicine | Admitting: Emergency Medicine

## 2024-02-17 ENCOUNTER — Encounter (HOSPITAL_COMMUNITY): Payer: Self-pay

## 2024-02-17 DIAGNOSIS — Z87891 Personal history of nicotine dependence: Secondary | ICD-10-CM | POA: Insufficient documentation

## 2024-02-17 DIAGNOSIS — R935 Abnormal findings on diagnostic imaging of other abdominal regions, including retroperitoneum: Secondary | ICD-10-CM | POA: Diagnosis not present

## 2024-02-17 DIAGNOSIS — K529 Noninfective gastroenteritis and colitis, unspecified: Secondary | ICD-10-CM | POA: Insufficient documentation

## 2024-02-17 DIAGNOSIS — C7951 Secondary malignant neoplasm of bone: Secondary | ICD-10-CM | POA: Diagnosis not present

## 2024-02-17 DIAGNOSIS — N281 Cyst of kidney, acquired: Secondary | ICD-10-CM | POA: Diagnosis not present

## 2024-02-17 DIAGNOSIS — Z79899 Other long term (current) drug therapy: Secondary | ICD-10-CM | POA: Insufficient documentation

## 2024-02-17 LAB — CBC
HCT: 44.1 % (ref 39.0–52.0)
Hemoglobin: 14.9 g/dL (ref 13.0–17.0)
MCH: 30.7 pg (ref 26.0–34.0)
MCHC: 33.8 g/dL (ref 30.0–36.0)
MCV: 90.7 fL (ref 80.0–100.0)
Platelets: 233 K/uL (ref 150–400)
RBC: 4.86 MIL/uL (ref 4.22–5.81)
RDW: 14 % (ref 11.5–15.5)
WBC: 6.5 K/uL (ref 4.0–10.5)
nRBC: 0 % (ref 0.0–0.2)

## 2024-02-17 LAB — COMPREHENSIVE METABOLIC PANEL WITH GFR
ALT: 113 U/L — ABNORMAL HIGH (ref 0–44)
AST: 32 U/L (ref 15–41)
Albumin: 3.7 g/dL (ref 3.5–5.0)
Alkaline Phosphatase: 82 U/L (ref 38–126)
Anion gap: 8 (ref 5–15)
BUN: 16 mg/dL (ref 6–20)
CO2: 26 mmol/L (ref 22–32)
Calcium: 8.7 mg/dL — ABNORMAL LOW (ref 8.9–10.3)
Chloride: 100 mmol/L (ref 98–111)
Creatinine, Ser: 0.77 mg/dL (ref 0.61–1.24)
GFR, Estimated: >60 mL/min (ref >60)
Glucose, Bld: 92 mg/dL (ref 70–99)
Potassium: 3.9 mmol/L (ref 3.5–5.1)
Sodium: 135 mmol/L (ref 135–145)
Total Bilirubin: 0.5 mg/dL (ref 0.0–1.2)
Total Protein: 5.6 g/dL — ABNORMAL LOW (ref 6.5–8.1)

## 2024-02-17 LAB — URINALYSIS, ROUTINE W REFLEX MICROSCOPIC
Bilirubin Urine: NEGATIVE
Glucose, UA: NEGATIVE mg/dL
Hgb urine dipstick: NEGATIVE
Ketones, ur: NEGATIVE mg/dL
Leukocytes,Ua: NEGATIVE
Nitrite: NEGATIVE
Protein, ur: NEGATIVE mg/dL
Specific Gravity, Urine: 1.023 (ref 1.005–1.030)
pH: 6 (ref 5.0–8.0)

## 2024-02-17 LAB — LIPASE, BLOOD: Lipase: 41 U/L (ref 11–51)

## 2024-02-17 MED ORDER — MORPHINE SULFATE (PF) 4 MG/ML IV SOLN
4.0000 mg | Freq: Once | INTRAVENOUS | Status: AC
  Administered 2024-02-17: 4 mg via INTRAVENOUS
  Filled 2024-02-17: qty 1

## 2024-02-17 MED ORDER — ONDANSETRON HCL 4 MG/2ML IJ SOLN
4.0000 mg | Freq: Once | INTRAMUSCULAR | Status: AC | PRN
  Administered 2024-02-17: 4 mg via INTRAVENOUS
  Filled 2024-02-17: qty 2

## 2024-02-17 MED ORDER — TRAZODONE HCL 100 MG PO TABS
50.0000 mg | ORAL_TABLET | Freq: Once | ORAL | Status: AC
  Administered 2024-02-18: 50 mg via ORAL
  Filled 2024-02-17: qty 1

## 2024-02-17 MED ORDER — IOHEXOL 300 MG/ML  SOLN
100.0000 mL | Freq: Once | INTRAMUSCULAR | Status: AC | PRN
  Administered 2024-02-17: 100 mL via INTRAVENOUS

## 2024-02-17 MED ORDER — AMOXICILLIN-POT CLAVULANATE 875-125 MG PO TABS: 1.0000 | ORAL_TABLET | Freq: Two times a day (BID) | ORAL | 0 refills | Status: DC

## 2024-02-17 MED ORDER — CIPROFLOXACIN HCL 500 MG PO TABS: 500.0000 mg | ORAL_TABLET | Freq: Two times a day (BID) | ORAL | 0 refills | Status: AC

## 2024-02-17 MED ORDER — HYDROCODONE-ACETAMINOPHEN 5-325 MG PO TABS: 2.0000 | ORAL_TABLET | ORAL | 0 refills | Status: DC | PRN

## 2024-02-17 NOTE — ED Provider Notes (Incomplete)
 [place] EMERGENCY DEPARTMENT AT [facility] Provider Note   CSN: [id number] Arrival date & time: 02/17/24  2007     Patient presents with: Abdominal Pain   Lance [name] is a 58 y.o. male.  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  He states that the pain seems to come in waves, 3-5 times an hour.  Pain began on Thursday after his radiation treatment.  He denies emesis, chest pain, shortness of breath, diarrhea, constipation, urinary symptoms, fever  {Add pertinent medical, surgical, social history, OB history to HPI:32947}  Abdominal Pain      Prior to Admission medications   Medication Sig Start Date End Date Taking? Authorizing Provider  ALPRAZolam  (XANAX ) 0.25 MG tablet Take 0.25 mg by mouth at bedtime as needed for sleep. 12/20/23   [provider]  ALPRAZolam  (XANAX ) 0.5 MG tablet Take 1 tablet (0.5 mg total) by mouth 2 (two) times daily as needed for anxiety. 02/05/24   Shannon Agent, MD  amLODipine  (NORVASC ) 5 MG tablet Take 1 tablet (5 mg total) by mouth daily. 02/08/24   Timmy Maude SAUNDERS, MD  bicalutamide  (CASODEX ) 50 MG tablet Take 1 tablet (50 mg total) by mouth daily. 01/14/24   Timmy Maude SAUNDERS, MD  dexamethasone  (DECADRON ) 4 MG tablet Take 5 tablets (20 mg total) by mouth daily. You must take this with food.  Preferably, take this in the morning with breakfast. 02/11/24   Timmy Maude SAUNDERS, MD  levothyroxine (SYNTHROID) 100 MCG tablet Take 100 mcg by mouth daily. 07/28/21   [provider]  ondansetron  (ZOFRAN ) 4 MG tablet Take 1 tablet (4 mg total) by mouth every 8 (eight) hours as needed for nausea or  vomiting. 02/05/24   Shannon Agent, MD  pantoprazole  (PROTONIX ) 40 MG tablet Take 1 tablet (40 mg total) by mouth 2 (two) times daily. 02/08/24   Timmy Maude SAUNDERS, MD  traMADol  (ULTRAM ) 50 MG tablet Take 1 tablet (50 mg total) by mouth every 6 (six) hours as needed for moderate pain (pain score 4-6). 02/05/24   Shannon Agent, MD  zolpidem  (AMBIEN ) 10 MG tablet Take 10 mg by mouth at bedtime.    [provider]    Allergies: Methimazole  and Methocarbamol    Review of Systems  Gastrointestinal:  Positive for abdominal pain.    Updated Vital Signs BP (!) 137/99   Pulse 99   Temp 97.8 F (36.6 C)   Resp 18   Ht 5' 8 (1.727 m)   Wt 71.7 kg   SpO2 100%   BMI 24.02 kg/m   Physical Exam Vitals and nursing note reviewed.  Constitutional:      General: [name] not in acute distress.    Appearance: [name] well-developed.  HENT:     Head: Normocephalic and atraumatic.  Eyes:     Conjunctiva/sclera: Conjunctivae normal.  Cardiovascular:     Rate and Rhythm: Normal rate and regular rhythm.  Pulmonary:     Effort: Pulmonary effort is normal. No respiratory distress.     Breath sounds: Normal breath sounds.  Abdominal:     Palpations: Abdomen is soft.     Tenderness: There is abdominal tenderness in the right lower  quadrant, periumbilical area and suprapubic area.  Musculoskeletal:        General: No swelling.     Cervical back: Neck supple.  Skin:    General: Skin is warm and dry.     Capillary Refill: Capillary refill takes less than 2 seconds.  Neurological:     Mental Status: [name] alert.  Psychiatric:        Mood and Affect: Mood normal.     (all labs ordered are listed, but only abnormal results are displayed) Labs Reviewed  COMPREHENSIVE METABOLIC PANEL WITH GFR - Abnormal; Notable for the following components:      Result Value   Calcium 8.7 (*)    Total Protein 5.6 (*)    ALT 113 (*)    All other components within normal limits  LIPASE, BLOOD  CBC   URINALYSIS, ROUTINE W REFLEX MICROSCOPIC    EKG: None  Radiology: CT ABDOMEN PELVIS W CONTRAST Result Date: 02/17/2024 CLINICAL DATA:  Generalized abdominal pain and nausea for 4 days, known stage IV cancer EXAM: CT ABDOMEN AND PELVIS WITH CONTRAST TECHNIQUE: Multidetector CT imaging of the abdomen and pelvis was performed using the standard protocol following bolus administration of intravenous contrast. RADIATION DOSE REDUCTION: This exam was performed according to the departmental dose-optimization program which includes automated exposure control, adjustment of the mA and/or kV according to patient size and/or use of iterative reconstruction technique. CONTRAST:  OMNIPAQUE  IOHEXOL  300 MG/ML  SOLN COMPARISON:  11/22/2023, 12/10/2023 FINDINGS: Lower chest: No acute pleural or parenchymal lung disease. Hepatobiliary: Hepatic steatosis. No focal liver abnormality. The gallbladder is unremarkable. Pancreas: Unremarkable. No pancreatic ductal dilatation or surrounding inflammatory changes. Spleen: Hypodensity seen within the spleen on prior studies are less pronounced on this exam, largest now measuring 2.2 cm. No splenomegaly. Adrenals/Urinary Tract: Stable bilateral renal cortical cysts. No urinary tract calculi or obstructive uropathy within either kidney. The adrenals are stable. Bladder is unremarkable. Stomach/Bowel: No bowel obstruction or ileus. Normal appendix right lower quadrant. Segmental mural thickening within the distal jejunum and ileum within the right lower abdomen, compatible with inflammatory or infectious enteritis. Vascular/Lymphatic: Aortic atherosclerosis. No enlarged abdominal or pelvic lymph nodes. Reproductive: Prostate is unremarkable. Other: No free fluid or free intraperitoneal gas. No abdominal wall hernia. Musculoskeletal: Bony metastases are again noted at T11, L4, and L5. Prior vertebral augmentation at L5. No acute displaced fractures. Reconstructed images demonstrate  no additional findings. IMPRESSION: 1. Segmental mural thickening involving the distal jejunum and ileum, compatible with inflammatory or infectious enteritis. No bowel obstruction or ileus. 2. Stable metastases within the thoracolumbar spine. 3. Persistent splenic hypodensities compatible with known metastatic lesions, decreased in prominence on this study. 4.  Aortic Atherosclerosis (ICD10-I70.0). 5. Hepatic steatosis. Electronically Signed   By: Ozell Daring M.D.   On: 02/17/2024 23:30    {Document cardiac monitor, telemetry assessment procedure when appropriate:32947} Procedures   Medications Ordered in the ED  morphine (PF) 4 MG/ML injection 4 mg (4 mg Intravenous Given 02/17/24 2253)  ondansetron  (ZOFRAN ) injection 4 mg (4 mg Intravenous Given 02/17/24 2253)  iohexol  (OMNIPAQUE ) 300 MG/ML solution 100 mL (100 mLs Intravenous Contrast Given 02/17/24 2316)      {Click here for ABCD2, HEART and other calculators REFRESH Note before signing:1}                              Medical Decision Making Amount and/or Complexity of Data Reviewed Labs: ordered.  Risk Prescription drug management.   This patient presents to the ED for concern of abdominal pain, this involves an extensive number of treatment options, and is a complaint that carries with it a high risk of complications and morbidity.  The differential diagnosis includes appendicitis, cholecystitis, enteritis, colitis, gastritis, others   Co morbidities / Chronic conditions that complicate the patient evaluation  Metastatic cancer, radiation treatment   Additional history obtained:  Additional history obtained from EMR External records from outside source obtained and reviewed including oncology notes   Lab Tests:  I Ordered, and personally interpreted labs.  The pertinent results include: Grossly unremarkable CBC, CMP, UA, lipase   Imaging Studies ordered:  I ordered imaging studies including CT abdomen pelvis with  contrast I independently visualized and interpreted imaging which showed  1. Segmental mural thickening involving the distal jejunum and  ileum, compatible with inflammatory or infectious enteritis. No  bowel obstruction or ileus.  2. Stable metastases within the thoracolumbar spine.  3. Persistent splenic hypodensities compatible with known metastatic  lesions, decreased in prominence on this study.  4.  Aortic Atherosclerosis (ICD10-I70.0).  5. Hepatic steatosis.   I agree with the radiologist interpretation   Problem List / ED Course / Critical interventions / Medication management   I ordered medication including morphine, Zofran , trazodone Reevaluation of the patient after these medicines showed that the patient improved I have reviewed the patients home medicines and have made adjustments as needed   Social Determinants of Health:  Patient is a former smoker   Test / Admission - Considered:  Patient with evidence of enteritis on imaging.  This is consistent with his history and physical.  Plan to treat with antibiotics and Norco.  The patient is currently tolerating oral intake and is actually hungry.  He has had no diarrhea or constipation.  Patient to follow-up with oncology for recommendations about whether or not to have his radiation treatment tomorrow.  No indication for further emergent workup or admission.  Return precautions have been provided.   {Document critical care time when appropriate  Document review of labs and clinical decision tools ie CHADS2VASC2, etc  Document your independent review of radiology images and any outside records  Document your discussion with family members, caretakers and with consultants  Document social determinants of health affecting pt's care  Document your decision making why or why not admission, treatments were needed:32947:::1}   Final diagnoses:  None    ED Discharge Orders     None

## 2024-02-17 NOTE — ED Provider Notes (Signed)
  EMERGENCY DEPARTMENT AT [facility] Provider Note   CSN: [id number] Arrival date & time: 02/17/24  2007     Patient presents with: Abdominal Pain   Lance [name] is a 58 y.o. male.  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  He states that the pain seems to come in waves, 3-5 times an hour.  Pain began on Thursday after his radiation treatment.  He denies emesis, chest pain, shortness of breath, diarrhea, constipation, urinary symptoms, fever    Abdominal Pain      Prior to Admission medications   Medication Sig Start Date End Date Taking? Authorizing Provider  amoxicillin-clavulanate (AUGMENTIN) 875-125 MG tablet Take 1 tablet by mouth every 12 (twelve) hours. 02/17/24  Yes Logan Ubaldo NOVAK, PA-C  ciprofloxacin (CIPRO) 500 MG tablet Take 1 tablet (500 mg total) by mouth every 12 (twelve) hours for 7 days. 02/17/24 02/24/24 Yes Logan Ubaldo B, PA-C  HYDROcodone -acetaminophen  (NORCO/VICODIN) 5-325 MG tablet Take 2 tablets by mouth every 4 (four) hours as needed. 02/17/24  Yes Logan Ubaldo NOVAK, PA-C  ALPRAZolam  (XANAX ) 0.25 MG tablet Take 0.25 mg by mouth at bedtime as needed for sleep. 12/20/23   [provider]  ALPRAZolam  (XANAX ) 0.5 MG tablet Take 1 tablet (0.5 mg total) by mouth 2 (two) times daily as needed for anxiety. 02/05/24   Shannon Agent, MD  amLODipine  (NORVASC ) 5 MG tablet Take 1 tablet (5 mg total) by mouth daily. 02/08/24   Timmy Maude SAUNDERS, MD  bicalutamide  (CASODEX ) 50 MG tablet Take 1 tablet (50 mg total) by mouth daily. 01/14/24   Timmy Maude SAUNDERS, MD  dexamethasone  (DECADRON ) 4 MG tablet Take 5  tablets (20 mg total) by mouth daily. You must take this with food.  Preferably, take this in the morning with breakfast. 02/11/24   Timmy Maude SAUNDERS, MD  levothyroxine (SYNTHROID) 100 MCG tablet Take 100 mcg by mouth daily. 07/28/21   [provider]  ondansetron  (ZOFRAN ) 4 MG tablet Take 1 tablet (4 mg total) by mouth every 8 (eight) hours as needed for nausea or vomiting. 02/05/24   Shannon Agent, MD  pantoprazole  (PROTONIX ) 40 MG tablet Take 1 tablet (40 mg total) by mouth 2 (two) times daily. 02/08/24   Timmy Maude SAUNDERS, MD  traMADol  (ULTRAM ) 50 MG tablet Take 1 tablet (50 mg total) by mouth every 6 (six) hours as needed for moderate pain (pain score 4-6). 02/05/24   Shannon Agent, MD  zolpidem  (AMBIEN ) 10 MG tablet Take 10 mg by mouth at bedtime.    [provider]    Allergies: Methimazole  and Methocarbamol    Review of Systems  Gastrointestinal:  Positive for abdominal pain.    Updated Vital Signs BP 115/82   Pulse 100   Temp 97.9 F (36.6 C)   Resp 18   Ht 5' 8 (1.727 m)   Wt 71.7 kg   SpO2 97%   BMI 24.02 kg/m   Physical Exam Vitals and nursing note reviewed.  Constitutional:      General: [name] not in acute distress.    Appearance: [name] well-developed.  HENT:     Head: Normocephalic and atraumatic.  Eyes:  Conjunctiva/sclera: Conjunctivae normal.  Cardiovascular:     Rate and Rhythm: Normal rate and regular rhythm.  Pulmonary:     Effort: Pulmonary effort is normal. No respiratory distress.     Breath sounds: Normal breath sounds.  Abdominal:     Palpations: Abdomen is soft.     Tenderness: There is abdominal tenderness in the right lower quadrant, periumbilical area and suprapubic area.  Musculoskeletal:        General: No swelling.     Cervical back: Neck supple.  Skin:    General: Skin is warm and dry.     Capillary Refill: Capillary refill takes less than 2 seconds.  Neurological:     Mental Status: [name] alert.  Psychiatric:         Mood and Affect: Mood normal.     (all labs ordered are listed, but only abnormal results are displayed) Labs Reviewed  COMPREHENSIVE METABOLIC PANEL WITH GFR - Abnormal; Notable for the following components:      Result Value   Calcium 8.7 (*)    Total Protein 5.6 (*)    ALT 113 (*)    All other components within normal limits  LIPASE, BLOOD  CBC  URINALYSIS, ROUTINE W REFLEX MICROSCOPIC    EKG: None  Radiology: CT ABDOMEN PELVIS W CONTRAST Result Date: 02/17/2024 CLINICAL DATA:  Generalized abdominal pain and nausea for 4 days, known stage IV cancer EXAM: CT ABDOMEN AND PELVIS WITH CONTRAST TECHNIQUE: Multidetector CT imaging of the abdomen and pelvis was performed using the standard protocol following bolus administration of intravenous contrast. RADIATION DOSE REDUCTION: This exam was performed according to the departmental dose-optimization program which includes automated exposure control, adjustment of the mA and/or kV according to patient size and/or use of iterative reconstruction technique. CONTRAST:  OMNIPAQUE  IOHEXOL  300 MG/ML  SOLN COMPARISON:  11/22/2023, 12/10/2023 FINDINGS: Lower chest: No acute pleural or parenchymal lung disease. Hepatobiliary: Hepatic steatosis. No focal liver abnormality. The gallbladder is unremarkable. Pancreas: Unremarkable. No pancreatic ductal dilatation or surrounding inflammatory changes. Spleen: Hypodensity seen within the spleen on prior studies are less pronounced on this exam, largest now measuring 2.2 cm. No splenomegaly. Adrenals/Urinary Tract: Stable bilateral renal cortical cysts. No urinary tract calculi or obstructive uropathy within either kidney. The adrenals are stable. Bladder is unremarkable. Stomach/Bowel: No bowel obstruction or ileus. Normal appendix right lower quadrant. Segmental mural thickening within the distal jejunum and ileum within the right lower abdomen, compatible with inflammatory or infectious enteritis.  Vascular/Lymphatic: Aortic atherosclerosis. No enlarged abdominal or pelvic lymph nodes. Reproductive: Prostate is unremarkable. Other: No free fluid or free intraperitoneal gas. No abdominal wall hernia. Musculoskeletal: Bony metastases are again noted at T11, L4, and L5. Prior vertebral augmentation at L5. No acute displaced fractures. Reconstructed images demonstrate no additional findings. IMPRESSION: 1. Segmental mural thickening involving the distal jejunum and ileum, compatible with inflammatory or infectious enteritis. No bowel obstruction or ileus. 2. Stable metastases within the thoracolumbar spine. 3. Persistent splenic hypodensities compatible with known metastatic lesions, decreased in prominence on this study. 4.  Aortic Atherosclerosis (ICD10-I70.0). 5. Hepatic steatosis. Electronically Signed   By: Ozell Daring M.D.   On: 02/17/2024 23:30     Procedures   Medications Ordered in the ED  morphine (PF) 4 MG/ML injection 4 mg (4 mg Intravenous Given 02/17/24 2253)  ondansetron  (ZOFRAN ) injection 4 mg (4 mg Intravenous Given 02/17/24 2253)  iohexol  (OMNIPAQUE ) 300 MG/ML solution 100 mL (100 mLs Intravenous Contrast Given 02/17/24 2316)  traZODone (DESYREL) tablet 50 mg (50 mg Oral Given 02/18/24 0012)                                    Medical Decision Making Amount and/or Complexity of Data Reviewed Labs: ordered.  Risk Prescription drug management.   This patient presents to the ED for concern of abdominal pain, this involves an extensive number of treatment options, and is a complaint that carries with it a high risk of complications and morbidity.  The differential diagnosis includes appendicitis, cholecystitis, enteritis, colitis, gastritis, others   Co morbidities / Chronic conditions that complicate the patient evaluation  Metastatic cancer, radiation treatment   Additional history obtained:  Additional history obtained from EMR External records from outside source  obtained and reviewed including oncology notes   Lab Tests:  I Ordered, and personally interpreted labs.  The pertinent results include: Grossly unremarkable CBC, CMP, UA, lipase   Imaging Studies ordered:  I ordered imaging studies including CT abdomen pelvis with contrast I independently visualized and interpreted imaging which showed  1. Segmental mural thickening involving the distal jejunum and  ileum, compatible with inflammatory or infectious enteritis. No  bowel obstruction or ileus.  2. Stable metastases within the thoracolumbar spine.  3. Persistent splenic hypodensities compatible with known metastatic  lesions, decreased in prominence on this study.  4.  Aortic Atherosclerosis (ICD10-I70.0).  5. Hepatic steatosis.   I agree with the radiologist interpretation   Problem List / ED Course / Critical interventions / Medication management   I ordered medication including morphine, Zofran , trazodone Reevaluation of the patient after these medicines showed that the patient improved I have reviewed the patients home medicines and have made adjustments as needed   Social Determinants of Health:  Patient is a former smoker   Test / Admission - Considered:  Patient with evidence of enteritis on imaging.  This is consistent with his history and physical.  Plan to treat with antibiotics and Norco.  The patient is currently tolerating oral intake and is actually hungry.  He has had no diarrhea or constipation.  Patient to follow-up with oncology for recommendations about whether or not to have his radiation treatment tomorrow.  No indication for further emergent workup or admission.  Return precautions have been provided.      Final diagnoses:  Enteritis    ED Discharge Orders          Ordered    ciprofloxacin (CIPRO) 500 MG tablet  Every 12 hours        02/17/24 2359    amoxicillin-clavulanate (AUGMENTIN) 875-125 MG tablet  Every 12 hours        02/17/24 2359     HYDROcodone -acetaminophen  (NORCO/VICODIN) 5-325 MG tablet  Every 4 hours PRN        02/17/24 2359               Logan Ubaldo KATHEE DEVONNA 02/18/24 0029    Griselda Norris, MD 02/18/24 [phone]

## 2024-02-17 NOTE — ED Triage Notes (Signed)
 Pt states that he has been having severe generalized abd pain with some nausea for the past 4 days. Pt is undergoing radiation therapy for stage 4 CA

## 2024-02-18 ENCOUNTER — Other Ambulatory Visit: Payer: Self-pay

## 2024-02-18 ENCOUNTER — Ambulatory Visit
Admission: RE | Admit: 2024-02-18 | Discharge: 2024-02-18 | Disposition: A | Discharge status: Home / Self Care | Source: Ambulatory Visit | Attending: Radiation Oncology | Admitting: Radiation Oncology

## 2024-02-18 ENCOUNTER — Telehealth: Payer: Self-pay | Admitting: *Deleted

## 2024-02-18 DIAGNOSIS — C089 Malignant neoplasm of major salivary gland, unspecified: Secondary | ICD-10-CM | POA: Diagnosis not present

## 2024-02-18 DIAGNOSIS — C7951 Secondary malignant neoplasm of bone: Secondary | ICD-10-CM | POA: Diagnosis not present

## 2024-02-18 DIAGNOSIS — Z510 Encounter for antineoplastic radiation therapy: Secondary | ICD-10-CM | POA: Diagnosis not present

## 2024-02-18 LAB — RAD ONC ARIA SESSION SUMMARY
Course Elapsed Days: 20
Plan Fractions Treated to Date: 3
Plan Prescribed Dose Per Fraction: 3 Gy
Plan Total Fractions Prescribed: 10
Plan Total Prescribed Dose: 30 Gy
Reference Point Dosage Given to Date: 9 Gy
Reference Point Session Dosage Given: 3 Gy
Session Number: 13

## 2024-02-18 NOTE — Discharge Instructions (Addendum)
 Your CT scan this evening showed inflammation of your colon.  Please take the prescribed antibiotics.  Use the pain medication as needed.  Discussed your upcoming radiology treatments with your oncology team.  Follow-up with oncology and primary care for further management as needed.  If you develop any life-threatening symptoms return to the emergency department.

## 2024-02-18 NOTE — Telephone Encounter (Signed)
 Received notification that patient was in the ER last night.  Labs and Imaging reviewed from last night.  Called patient to be seen today by a provider.  Patient has a 2:40 p appt with RT so unable to accommodate.  Told patient to take Cipro and Augmentin and pain medication provided by ER and appt made to be seen first thing in the morning after Radiation.  Patient and spouse aware of instructions

## 2024-02-19 ENCOUNTER — Ambulatory Visit
Admission: RE | Admit: 2024-02-19 | Discharge: 2024-02-19 | Disposition: A | Source: Ambulatory Visit | Attending: Radiation Oncology | Admitting: Radiation Oncology

## 2024-02-19 ENCOUNTER — Inpatient Hospital Stay: Admitting: Family

## 2024-02-19 ENCOUNTER — Other Ambulatory Visit: Payer: Self-pay

## 2024-02-19 ENCOUNTER — Inpatient Hospital Stay

## 2024-02-19 VITALS — BP 103/78 | HR 97 | Temp 98.0°F | Resp 19 | Ht 68.0 in | Wt 162.0 lb

## 2024-02-19 DIAGNOSIS — K529 Noninfective gastroenteritis and colitis, unspecified: Secondary | ICD-10-CM | POA: Diagnosis not present

## 2024-02-19 DIAGNOSIS — C7951 Secondary malignant neoplasm of bone: Secondary | ICD-10-CM

## 2024-02-19 DIAGNOSIS — Z510 Encounter for antineoplastic radiation therapy: Secondary | ICD-10-CM | POA: Diagnosis not present

## 2024-02-19 DIAGNOSIS — R590 Localized enlarged lymph nodes: Secondary | ICD-10-CM

## 2024-02-19 DIAGNOSIS — C089 Malignant neoplasm of major salivary gland, unspecified: Secondary | ICD-10-CM

## 2024-02-19 DIAGNOSIS — C801 Malignant (primary) neoplasm, unspecified: Secondary | ICD-10-CM

## 2024-02-19 LAB — CMP (CANCER CENTER ONLY)
ALT: 72 U/L — ABNORMAL HIGH (ref 0–44)
AST: 23 U/L (ref 15–41)
Albumin: 3.7 g/dL (ref 3.5–5.0)
Alkaline Phosphatase: 79 U/L (ref 38–126)
Anion gap: 8 (ref 5–15)
BUN: 13 mg/dL (ref 6–20)
CO2: 27 mmol/L (ref 22–32)
Calcium: 8.3 mg/dL — ABNORMAL LOW (ref 8.9–10.3)
Chloride: 99 mmol/L (ref 98–111)
Creatinine: 0.8 mg/dL (ref 0.61–1.24)
GFR, Estimated: >60 mL/min (ref >60)
Glucose, Bld: 105 mg/dL — ABNORMAL HIGH (ref 70–99)
Potassium: 4.6 mmol/L (ref 3.5–5.1)
Sodium: 133 mmol/L — ABNORMAL LOW (ref 135–145)
Total Bilirubin: 0.7 mg/dL (ref 0.0–1.2)
Total Protein: 5.8 g/dL — ABNORMAL LOW (ref 6.5–8.1)

## 2024-02-19 LAB — RAD ONC ARIA SESSION SUMMARY
Course Elapsed Days: 21
Plan Fractions Treated to Date: 4
Plan Prescribed Dose Per Fraction: 3 Gy
Plan Total Fractions Prescribed: 10
Plan Total Prescribed Dose: 30 Gy
Reference Point Dosage Given to Date: 12 Gy
Reference Point Session Dosage Given: 3 Gy
Session Number: 14

## 2024-02-19 LAB — CBC WITH DIFFERENTIAL (CANCER CENTER ONLY)
Abs Immature Granulocytes: 0.07 K/uL (ref 0.00–0.07)
Basophils Absolute: 0 K/uL (ref 0.0–0.1)
Basophils Relative: 0 %
Eosinophils Absolute: 0.2 K/uL (ref 0.0–0.5)
Eosinophils Relative: 2 %
HCT: 41.7 % (ref 39.0–52.0)
Hemoglobin: 14.2 g/dL (ref 13.0–17.0)
Immature Granulocytes: 1 %
Lymphocytes Relative: 4 %
Lymphs Abs: 0.4 K/uL — ABNORMAL LOW (ref 0.7–4.0)
MCH: 30.7 pg (ref 26.0–34.0)
MCHC: 34.1 g/dL (ref 30.0–36.0)
MCV: 90.1 fL (ref 80.0–100.0)
Monocytes Absolute: 0.8 K/uL (ref 0.1–1.0)
Monocytes Relative: 8 %
Neutro Abs: 8.2 K/uL — ABNORMAL HIGH (ref 1.7–7.7)
Neutrophils Relative %: 85 %
Platelet Count: 200 K/uL (ref 150–400)
RBC: 4.63 MIL/uL (ref 4.22–5.81)
RDW: 13.8 % (ref 11.5–15.5)
WBC Count: 9.6 K/uL (ref 4.0–10.5)
nRBC: 0 % (ref 0.0–0.2)

## 2024-02-19 LAB — LACTATE DEHYDROGENASE: LDH: 123 U/L (ref 98–192)

## 2024-02-19 MED ORDER — OXYCODONE HCL 5 MG PO TABS: 5.0000 mg | ORAL_TABLET | Freq: Four times a day (QID) | ORAL | 0 refills | Status: DC | PRN

## 2024-02-19 MED ORDER — HYDROMORPHONE HCL 1 MG/ML IJ SOLN
1.0000 mg | Freq: Once | INTRAMUSCULAR | Status: AC
  Administered 2024-02-19: 1 mg via INTRAVENOUS
  Filled 2024-02-19: qty 1

## 2024-02-19 MED ORDER — SODIUM CHLORIDE 0.9 % IV SOLN: Freq: Once | INTRAVENOUS | Status: AC

## 2024-02-19 MED ORDER — METRONIDAZOLE 500 MG PO TABS: 500.0000 mg | ORAL_TABLET | Freq: Three times a day (TID) | ORAL | 0 refills | Status: DC

## 2024-02-19 NOTE — Patient Instructions (Signed)
 Dehydration, Adult Dehydration is a condition in which there is not enough water or other fluids in the body. This happens when a person loses more fluids than they take in. Important organs cannot work right without the right amount of fluids. Any loss of fluids from the body can cause dehydration. Dehydration can be mild, worse, or very bad. It should be treated right away to keep it from getting very bad. What are the causes? Conditions that cause loss of water in the body. They include: Watery poop (diarrhea). Vomiting. Sweating a lot. Fever. Infection. Peeing (urinating) a lot. Not drinking enough fluids. Certain medicines, such as medicines that take extra fluid out of the body (diuretics). Lack of safe drinking water. Not being able to get enough water and food. What increases the risk? Having a long-term (chronic) illness that has not been treated the right way, such as: Diabetes. Heart disease. Kidney disease. Being 19 years of age or older. Having a disability. Living in a place that is high above the ground or sea (high in altitude). The thinner, drier air causes more fluid loss. Doing exercises that put stress on your body for a long time. Being active when in hot places. What are the signs or symptoms? Symptoms of dehydration depend on how bad it is. Mild or worse dehydration Thirst. Dry lips or dry mouth. Feeling dizzy or light-headed. Muscle cramps. Passing little pee or dark pee. Pee may be the color of tea. Headache. Very bad dehydration Changes in skin. Skin may: Be cold to the touch (clammy). Be blotchy or pale. Not go back to normal right after you pinch it and let it go. Little or no tears, pee, or sweat. Fast breathing. Low blood pressure. Weak pulse. Pulse that is more than 100 beats a minute when you are sitting still. Other changes, such as: Feeling very thirsty. Eyes that look hollow (sunken). Cold hands and feet. Being confused. Being very  tired (lethargic) or having trouble waking from sleep. Losing weight. Loss of consciousness. How is this treated? Treatment for this condition depends on how bad your dehydration is. Treatment should start right away. Do not wait until your condition gets very bad. Very bad dehydration is an emergency. You will need to go to a hospital. Mild or worse dehydration can be treated at home. You may be asked to: Drink more fluids. Drink an oral rehydration solution (ORS). This drink gives you the right amount of fluids, salts, and minerals (electrolytes). Very bad dehydration can be treated: With fluids through an IV tube. By correcting low levels of electrolytes in the body. By treating the problem that caused your dehydration. Follow these instructions at home: Oral rehydration solution If told by your doctor, drink an ORS: Make an ORS. Use instructions on the package. Start by drinking small amounts, about  cup (120 mL) every 5-10 minutes. Slowly drink more until you have had the amount that your doctor said to have.  Eating and drinking  Drink enough clear fluid to keep your pee pale yellow. If you were told to drink an ORS, finish the ORS first. Then, start slowly drinking other clear fluids. Drink fluids such as: Water. Do not drink only water. Doing that can make the salt (sodium) level in your body get too low. Water from ice chips you suck on. Fruit juice that you have added water to (diluted). Low-calorie sports drinks. Eat foods that have the right amounts of salts and minerals, such as bananas, oranges, potatoes,  tomatoes, or spinach. Do not drink alcohol . Avoid drinks that have caffeine or sugar. These include:: High-calorie sports drinks. Fruit juice that you did not add water to. Soda. Coffee or energy drinks. Avoid foods that are greasy or have a lot of fat or sugar. General instructions Take over-the-counter and prescription medicines only as told by your doctor. Do  not take sodium tablets. Doing that can make the salt level in your body get too high. Return to your normal activities as told by your doctor. Ask your doctor what activities are safe for you. Keep all follow-up visits. Your doctor may check and change your treatment. Contact a doctor if: You have pain in your belly (abdomen) and the pain: Gets worse. Stays in one place. You have a rash. You have a stiff neck. You get angry or annoyed more easily than normal. You are more tired or have a harder time waking than normal. You feel weak or dizzy. You feel very thirsty. Get help right away if: You have any symptoms of very bad dehydration. You vomit every time you eat or drink. Your vomiting gets worse, does not go away, or you vomit blood or green stuff. You are getting treatment, but symptoms are getting worse. You have a fever. You have a very bad headache. You have: Diarrhea that gets worse or does not go away. Blood in your poop (stool). This may cause poop to look black and tarry. No pee in 6-8 hours. Only a small amount of pee in 6-8 hours, and the pee is very dark. You have trouble breathing. These symptoms may be an emergency. Get help right away. Call 911. Do not wait to see if the symptoms will go away. Do not drive yourself to the hospital. This information is not intended to replace advice given to you by your health care provider. Make sure you discuss any questions you have with your health care provider. Document Revised: 12/05/2021 Document Reviewed: 12/05/2021 Elsevier Patient Education  2024 Else[employer] Inc.  Hydromorphone Injection What is this medication? HYDROMORPHONE (hye droe MOR fone) treats severe pain. It is prescribed when other pain medications have not worked or cannot be tolerated. It works by blocking pain signals in the brain. It belongs to a group of medications called opioids. This medicine may be used for other purposes; ask your health care provider  or pharmacist if you have questions. COMMON BRAND NAME(S): Dilaudid, Dilaudid-HP, Simplist Dilaudid What should I tell my care team before I take this medication? They need to know if you have any of these conditions: Brain tumor Frequently drink alcohol  Head injury Heart disease Kidney disease Liver disease Lung or breathing disease, such as asthma Seizures Stomach or intestine problems Substance use disorder Taken an MAOI, such as Marplan, Nardil, or Parnate in the last 14 days An unusual or allergic reaction to hydromorphone, other medications, foods, dyes, or preservatives Pregnant or trying to get pregnant Breastfeeding How should I use this medication? This medication is for injection into a vein, into a muscle, or under the skin. This medication is given in a hospital or clinic. In rare cases, you might get this medication at home. You will be taught how to give this medication. Use exactly as directed. Take your medication at regular intervals. Do not take your medication more often than directed. It is important that you put your used needles and syringes in a special sharps container. Do not put them in a trash can. If you do not have  a sharps container, call your pharmacist or care team to get one. Talk to your care team about the use of this medication in children. Special care may be needed. Overdosage: If you think you have taken too much of this medicine contact a poison control center or emergency room at once. NOTE: This medicine is only for you. Do not share this medicine with others. What if I miss a dose? If you miss a dose, use it as soon as you can. If it is almost time for your next dose, use only that dose. Do not use double or extra doses. What may interact with this medication? Do not take this medication with any of the following: Olanzapine; samidorphan Safinamide This medication may also interact with the following: Alcohol  Antihistamines for allergy,  cough, and cold Atropine Benzodiazepines, such as alprazolam , diazepam, lorazepam Certain medications for bladder problems, such as oxybutynin or tolterodine Certain medications for depression, anxiety, or other mental health conditions Certain medication for migraines, such as sumatriptan Certain medications for Parkinson disease, such as benztropine or trihexyphenidyl Certain medications for seizures, such as phenobarbital or primidone Certain medications for stomach problems, such as dicyclomine or hyoscyamine Certain medications for travel sickness, such as scopolamine Diuretics Ipratropium Linezolid Medications that cause drowsiness before a procedure, such as propofol  Medications that help you fall asleep Medications that relax muscles Methylene blue Other opioids for pain or cough Phenothiazines, such as chlorpromazine, prochlorperazine, thioridazine St. John's wort Stimulant medications for ADHD, weight loss, or staying awake Tryptophan Other medications may affect the way this medication works. Talk with your care team about all of the medications you take. They may suggest changes to your treatment plan to lower the risk of side effects and to make sure your medications work as intended. This list may not describe all possible interactions. Give your health care provider a list of all the medicines, herbs, non-prescription drugs, or dietary supplements you use. Also tell them if you smoke, drink alcohol , or use illegal drugs. Some items may interact with your medicine. What should I watch for while using this medication? Tell your care team if your pain does not go away, if it gets worse, or if you have new or a different type of pain. You may develop tolerance to this medication. Tolerance means that you will need a higher dose of the medication for pain relief. Tolerance is normal and is expected if you take this medication for a long time. Taking this medication with other  substances that cause drowsiness, such as alcohol , benzodiazepines, or other opioids can cause serious side effects. Give your care team a list of all medications you use. They will tell you how much medication to take. Do not take more medication than directed. Call emergency services if you have problems breathing or staying awake. Long term use of this medication may cause your brain and body to depend on it. This can happen even when used as directed by your care team. You and your care team will work together to determine how long you will need to take this medication. If your care team wants you to stop this medication, the dose will be slowly lowered over time to reduce the risk of side effects. Naloxone is an emergency medication used for an opioid overdose. An overdose can happen if you take too much of an opioid. It can also happen if an opioid is taken with some other medications or substances such as alcohol . Know the symptoms of an  overdose, such as trouble breathing, unusually tired or sleepy, or not being able to respond or wake up. Make sure to tell caregivers and close contacts where your naloxone is stored. Make sure they know how to use it. After naloxone is given, the person giving it must call emergency services. Naloxone is a temporary treatment. Repeat doses may be needed. This medication may affect your coordination, reaction time, or judgment. Do not drive or operate machinery until you know how this medication affects you. Sit up or stand slowly to reduce the risk of dizzy or fainting spells. Drinking alcohol  with this medication can increase the risk of these side effects. This medication will cause constipation. If you do not have a bowel movement for 3 days, call your care team. Your mouth may get dry. Chewing sugarless gum or sucking hard candy and drinking plenty of water may help. Contact your care team if the problem does not go away or is severe. Talk to your care team if you  may be pregnant. Prolonged use of this medication during pregnancy can cause temporary withdrawal in a newborn. Talk to your care team before breastfeeding. Changes to your treatment plan may be needed. If you breastfeed while taking this medication, seek medical care right away if you notice the child has slow or noisy breathing, is unusually sleepy or not able to wake up, or is limp. Long-term use of this medication may cause infertility. Talk to your care team if you are concerned about your fertility. What side effects may I notice from receiving this medication? Side effects that you should report to your care team as soon as possible: Allergic reactions--skin rash, itching, hives, swelling of the face, lips, tongue, or throat CNS depression--slow or shallow breathing, shortness of breath, feeling faint, dizziness, confusion, trouble staying awake Low adrenal gland function--nausea, vomiting, loss of appetite, unusual weakness or fatigue, dizziness Low blood pressure--dizziness, feeling faint or lightheaded, blurry vision Side effects that usually do not require medical attention (report to your care team if they continue or are bothersome): Constipation Dizziness Drowsiness Dry mouth Headache Nausea Vomiting This list may not describe all possible side effects. Call your doctor for medical advice about side effects. You may report side effects to FDA at 1-800-FDA-1088. Where should I keep my medication? Keep this medication out of reach of children and pets. Store it out of sight in a safe place. Do not share it with others. Misuse of this medication is dangerous and against the law. Your care team will tell you how to store this medication. Get rid of any unused medication after the expiration date. This medication may cause harm and death if it is taken by other adults, children, or pets. It is important to get rid of the medication as soon as you no longer need it or it is expired. To  get rid of this medication: Take the medication to a take-back program. Check your pharmacy or law enforcement to find a location. Follow the steps given to you by your pharmacy. You may be given a pre-paid mail-back envelope or disposal product to safely get rid of your medication. If other options are not available, remove the medication from the container and flush it down the toilet. NOTE: This sheet is a summary. It may not cover all possible information. If you have questions about this medicine, talk to your doctor, pharmacist, or health care provider.  2025 Elsevier/Gold Standard (2023-05-10 00:00:00)

## 2024-02-19 NOTE — Progress Notes (Signed)
 Hematology and Oncology Follow Up Visit  Lance [name] [id number] 10-28-1965 58 y.o. 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 He was in the ED 2 days ago with enteritis discharged on oral Cipro, Augmentin and pain medication. He states that he completed radiation therapy to the lumbar spine and is now receiving to the neck.  He was able to go to radiation yesterday and this morning prior to coming to this appointment.  [name] still symptomatic with low abdominal pain right above the pelvis.  No blood loss noted.  No fever, chills, n/v, cough, rash, dizziness, SOB, chest pain or changes in bladder habits.  He has not had a BM is several days but denies feeling constipated.  He states that his appetite is good. Weight is stable at 162 lbs.  No swelling, numbness or tingling in his extremities.   ECOG Performance Status: 1 - Symptomatic but completely ambulatory  Medications:  Allergies as of 02/19/2024       Reactions   Methimazole  Other (See Comments)   WBC drops   Methocarbamol Other (See Comments)   WBC drops        Medication List        Accurate as of February 19, 2024 10:51 AM. If you have any questions, ask your nurse or doctor.          ALPRAZolam  0.25 MG tablet Commonly known as: XANAX  Take 0.25 mg by mouth at bedtime as needed for sleep.   ALPRAZolam  0.5 MG tablet Commonly known as: XANAX  Take 1 tablet (0.5 mg total) by mouth 2 (two) times daily as needed for anxiety.   amLODipine  5 MG tablet Commonly known as: NORVASC  Take 1 tablet (5 mg total) by mouth daily.   amoxicillin-clavulanate 875-125 MG tablet Commonly known  as: AUGMENTIN Take 1 tablet by mouth every 12 (twelve) hours.   bicalutamide  50 MG tablet Commonly known as: CASODEX  Take 1 tablet (50 mg total) by mouth daily.   ciprofloxacin 500 MG tablet Commonly known as: CIPRO Take 1 tablet (500 mg total) by mouth every 12 (twelve) hours for 7 days.   dexamethasone  4 MG tablet Commonly known as: DECADRON  Take 5 tablets (20 mg total) by mouth daily. You must take this with food.  Preferably, take this in the morning with breakfast.   HYDROcodone -acetaminophen  5-325 MG tablet Commonly known as: NORCO/VICODIN Take 2 tablets by mouth every 4 (four) hours as needed.   levothyroxine 100 MCG tablet Commonly known as: SYNTHROID Take 100 mcg by mouth daily.   ondansetron  4 MG tablet Commonly known as: Zofran  Take 1 tablet (4 mg total) by mouth every 8 (eight) hours as needed for nausea or vomiting.   pantoprazole  40 MG tablet Commonly known as: Protonix  Take 1 tablet (40 mg total) by mouth 2 (two) times daily.   traMADol  50 MG tablet Commonly known as: ULTRAM  Take 1 tablet (50 mg total) by mouth every 6 (six) hours as needed for moderate pain (pain score 4-6).   zolpidem  10 MG tablet Commonly known as: AMBIEN  Take 10 mg by mouth at bedtime.  Allergies:  Allergies  Allergen Reactions   Methimazole  Other (See Comments)    WBC drops   Methocarbamol Other (See Comments)    WBC drops    Past Medical History, Surgical history, Social history, and Family History were reviewed and updated.  Review of Systems: All other 10 point review of systems is negative.   Physical Exam:  height is 5' 8 (1.727 m) and weight is 162 lb (73.5 kg).   Wt Readings from Last 3 Encounters:  02/19/24 162 lb (73.5 kg)  02/17/24 158 lb (71.7 kg)  02/11/24 161 lb (73 kg)    Ocular: Sclerae unicteric, pupils equal, round and reactive to light Ear-nose-throat: Oropharynx clear, dentition fair Lymphatic: No cervical or supraclavicular  adenopathy Lungs no rales or rhonchi, good excursion bilaterally Heart regular rate and rhythm, no murmur appreciated Abd soft, nontender, positive bowel sounds MSK no focal spinal tenderness, no joint edema Neuro: non-focal, well-oriented, appropriate affect Breasts: Deferred   Lab Results  Component Value Date   WBC 6.5 02/17/2024   HGB 14.9 02/17/2024   HCT 44.1 02/17/2024   MCV 90.7 02/17/2024   PLT 233 02/17/2024   No results found for: FERRITIN, IRON, TIBC, UIBC, IRONPCTSAT Lab Results  Component Value Date   RBC 4.86 02/17/2024   Lab Results  Component Value Date   KPAFRELGTCHN 10.5 11/21/2023   LAMBDASER 6.3 11/21/2023   KAPLAMBRATIO 1.67 (H) 11/21/2023   Lab Results  Component Value Date   IGGSERUM 872 11/21/2023   IGA 92 11/21/2023   IGMSERUM 52 11/21/2023   Lab Results  Component Value Date   TOTALPROTELP 6.6 11/21/2023   ALBUMINELP 4.1 11/21/2023   A1GS 0.2 11/21/2023   A2GS 0.6 11/21/2023   BETS 1.0 11/21/2023   GAMS 0.7 11/21/2023   MSPIKE Not Observed 11/21/2023     Chemistry      Component Value Date/Time   NA 135 02/17/2024 2015   K 3.9 02/17/2024 2015   CL 100 02/17/2024 2015   CO2 26 02/17/2024 2015   BUN 16 02/17/2024 2015   CREATININE 0.77 02/17/2024 2015   CREATININE 0.71 02/11/2024 1015      Component Value Date/Time   CALCIUM 8.7 (L) 02/17/2024 2015   ALKPHOS 82 02/17/2024 2015   AST 32 02/17/2024 2015   AST 41 02/11/2024 1015   ALT 113 (H) 02/17/2024 2015   ALT 92 (H) 02/11/2024 1015   BILITOT 0.5 02/17/2024 2015   BILITOT 0.4 02/11/2024 1015       Impression and Plan: Lance [name] is a pleasant 58 yo caucasian gentleman with 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 This is strongly positive for androgen receptor.  [name] currently on Lupron  and Casodex .  He has completed radiation to the lumbar spine and is now receiving to the cervical spine.  He developed enteritis symptoms starting Thursday last  week.  He started Cipro, Augmentin and Norco in the ED Sunday night.  His abdominal pain is a 9/10 at this time and he is also frustrated that he can not rest.  We will have him hold the Casodex  and stop the Augmentin and Norco.  We will switch him over to Flagyl 500 mg TID x 7 days and then add Oxycodone  for pain.  He will get IV fluids and pain medication while here today.  Follow-up symptom management PRN and MD visit on 03/04/2024.   Lauraine Pepper, NP 9/30/202510:51 AM

## 2024-02-20 ENCOUNTER — Other Ambulatory Visit: Payer: Self-pay

## 2024-02-20 ENCOUNTER — Ambulatory Visit
Admission: RE | Admit: 2024-02-20 | Discharge: 2024-02-20 | Disposition: A | Discharge status: Home / Self Care | Source: Ambulatory Visit | Attending: Radiation Oncology | Admitting: Radiation Oncology

## 2024-02-20 ENCOUNTER — Inpatient Hospital Stay

## 2024-02-20 DIAGNOSIS — J029 Acute pharyngitis, unspecified: Secondary | ICD-10-CM | POA: Diagnosis not present

## 2024-02-20 DIAGNOSIS — E860 Dehydration: Secondary | ICD-10-CM | POA: Diagnosis not present

## 2024-02-20 DIAGNOSIS — C089 Malignant neoplasm of major salivary gland, unspecified: Secondary | ICD-10-CM | POA: Diagnosis not present

## 2024-02-20 DIAGNOSIS — C7951 Secondary malignant neoplasm of bone: Secondary | ICD-10-CM | POA: Insufficient documentation

## 2024-02-20 DIAGNOSIS — Z510 Encounter for antineoplastic radiation therapy: Secondary | ICD-10-CM | POA: Diagnosis not present

## 2024-02-20 LAB — RAD ONC ARIA SESSION SUMMARY
Course Elapsed Days: 22
Plan Fractions Treated to Date: 5
Plan Prescribed Dose Per Fraction: 3 Gy
Plan Total Fractions Prescribed: 10
Plan Total Prescribed Dose: 30 Gy
Reference Point Dosage Given to Date: 15 Gy
Reference Point Session Dosage Given: 3 Gy
Session Number: 15

## 2024-02-21 ENCOUNTER — Ambulatory Visit
Admission: RE | Admit: 2024-02-21 | Discharge: 2024-02-21 | Disposition: A | Source: Ambulatory Visit | Attending: Radiation Oncology | Admitting: Radiation Oncology

## 2024-02-21 ENCOUNTER — Other Ambulatory Visit: Payer: Self-pay

## 2024-02-21 DIAGNOSIS — J029 Acute pharyngitis, unspecified: Secondary | ICD-10-CM | POA: Diagnosis not present

## 2024-02-21 DIAGNOSIS — Z510 Encounter for antineoplastic radiation therapy: Secondary | ICD-10-CM | POA: Diagnosis not present

## 2024-02-21 LAB — RAD ONC ARIA SESSION SUMMARY
Course Elapsed Days: 23
Plan Fractions Treated to Date: 6
Plan Prescribed Dose Per Fraction: 3 Gy
Plan Total Fractions Prescribed: 10
Plan Total Prescribed Dose: 30 Gy
Reference Point Dosage Given to Date: 18 Gy
Reference Point Session Dosage Given: 3 Gy
Session Number: 16

## 2024-02-22 ENCOUNTER — Ambulatory Visit
Admission: RE | Admit: 2024-02-22 | Discharge: 2024-02-22 | Disposition: A | Discharge status: Home / Self Care | Source: Ambulatory Visit | Attending: Radiation Oncology | Admitting: Radiation Oncology

## 2024-02-22 ENCOUNTER — Other Ambulatory Visit: Payer: Self-pay

## 2024-02-22 DIAGNOSIS — C7951 Secondary malignant neoplasm of bone: Secondary | ICD-10-CM | POA: Diagnosis not present

## 2024-02-22 DIAGNOSIS — Z510 Encounter for antineoplastic radiation therapy: Secondary | ICD-10-CM | POA: Diagnosis not present

## 2024-02-22 DIAGNOSIS — C089 Malignant neoplasm of major salivary gland, unspecified: Secondary | ICD-10-CM | POA: Diagnosis not present

## 2024-02-22 LAB — RAD ONC ARIA SESSION SUMMARY
Course Elapsed Days: 24
Plan Fractions Treated to Date: 7
Plan Prescribed Dose Per Fraction: 3 Gy
Plan Total Fractions Prescribed: 10
Plan Total Prescribed Dose: 30 Gy
Reference Point Dosage Given to Date: 21 Gy
Reference Point Session Dosage Given: 3 Gy
Session Number: 17

## 2024-02-25 ENCOUNTER — Other Ambulatory Visit: Payer: Self-pay

## 2024-02-25 ENCOUNTER — Ambulatory Visit
Admission: RE | Admit: 2024-02-25 | Discharge: 2024-02-25 | Disposition: A | Discharge status: Home / Self Care | Source: Ambulatory Visit | Attending: Radiation Oncology | Admitting: Radiation Oncology

## 2024-02-25 DIAGNOSIS — C089 Malignant neoplasm of major salivary gland, unspecified: Secondary | ICD-10-CM | POA: Diagnosis not present

## 2024-02-25 DIAGNOSIS — Z510 Encounter for antineoplastic radiation therapy: Secondary | ICD-10-CM | POA: Diagnosis not present

## 2024-02-25 LAB — RAD ONC ARIA SESSION SUMMARY
Course Elapsed Days: 27
Plan Fractions Treated to Date: 8
Plan Prescribed Dose Per Fraction: 3 Gy
Plan Total Fractions Prescribed: 10
Plan Total Prescribed Dose: 30 Gy
Reference Point Dosage Given to Date: 24 Gy
Reference Point Session Dosage Given: 3 Gy
Session Number: 18

## 2024-02-26 ENCOUNTER — Ambulatory Visit
Admission: RE | Admit: 2024-02-26 | Discharge: 2024-02-26 | Disposition: A | Discharge status: Home / Self Care | Source: Ambulatory Visit | Attending: Radiation Oncology | Admitting: Radiation Oncology

## 2024-02-26 ENCOUNTER — Other Ambulatory Visit: Payer: Self-pay

## 2024-02-26 DIAGNOSIS — Z510 Encounter for antineoplastic radiation therapy: Secondary | ICD-10-CM | POA: Diagnosis not present

## 2024-02-26 LAB — RAD ONC ARIA SESSION SUMMARY
Course Elapsed Days: 28
Plan Fractions Treated to Date: 9
Plan Prescribed Dose Per Fraction: 3 Gy
Plan Total Fractions Prescribed: 10
Plan Total Prescribed Dose: 30 Gy
Reference Point Dosage Given to Date: 27 Gy
Reference Point Session Dosage Given: 3 Gy
Session Number: 19

## 2024-02-27 ENCOUNTER — Other Ambulatory Visit: Payer: Self-pay

## 2024-02-27 ENCOUNTER — Ambulatory Visit
Admission: RE | Admit: 2024-02-27 | Discharge: 2024-02-27 | Disposition: A | Source: Ambulatory Visit | Attending: Radiation Oncology | Admitting: Radiation Oncology

## 2024-02-27 DIAGNOSIS — E860 Dehydration: Secondary | ICD-10-CM | POA: Diagnosis not present

## 2024-02-27 DIAGNOSIS — C089 Malignant neoplasm of major salivary gland, unspecified: Secondary | ICD-10-CM | POA: Diagnosis not present

## 2024-02-27 DIAGNOSIS — C7951 Secondary malignant neoplasm of bone: Secondary | ICD-10-CM | POA: Diagnosis not present

## 2024-02-27 DIAGNOSIS — Z510 Encounter for antineoplastic radiation therapy: Secondary | ICD-10-CM | POA: Diagnosis not present

## 2024-02-27 DIAGNOSIS — J029 Acute pharyngitis, unspecified: Secondary | ICD-10-CM | POA: Diagnosis not present

## 2024-02-27 LAB — RAD ONC ARIA SESSION SUMMARY
Course Elapsed Days: 29
Plan Fractions Treated to Date: 10
Plan Prescribed Dose Per Fraction: 3 Gy
Plan Total Fractions Prescribed: 10
Plan Total Prescribed Dose: 30 Gy
Reference Point Dosage Given to Date: 30 Gy
Reference Point Session Dosage Given: 3 Gy
Session Number: 20

## 2024-02-28 ENCOUNTER — Telehealth: Payer: Self-pay | Admitting: *Deleted

## 2024-02-28 NOTE — Telephone Encounter (Signed)
 Connected with ZAE KIRTZ, [phone] regarding [facility] request for medical records.  Reviewed required release for use and disclosure of protected health Information (PHI) to third parties.  Confirmed email address.  [employer] HIPAA Authorization to [email or web address] through [employer]  Awaiting authorization to send request to (SW) H.I.M.

## 2024-02-28 NOTE — Radiation Completion Notes (Signed)
  Radiation Oncology         [phone] ________________________________  Name: Lance [name] MRN: [id number]  Date of Service: 02/27/2024  DOB: [birth year]  End of Treatment Note  Diagnosis: Bony metastases from carcinoma favoring a salivary duct carcinoma Intent: Palliative     ==========DELIVERED PLANS==========  First Treatment Date: 2024-01-29 Last Treatment Date: 2024-02-27   Plan Name: Spine_Lumbar Site: Lumbar Spine Technique: 3D Mode: Photon Dose Per Fraction: 3 Gy Prescribed Dose (Delivered / Prescribed): 30 Gy / 30 Gy Prescribed Fxs (Delivered / Prescribed): 10 / 10   Plan Name: Spine_C Site: Cervical Spine Technique: 3D Mode: Photon Dose Per Fraction: 3 Gy Prescribed Dose (Delivered / Prescribed): 30 Gy / 30 Gy Prescribed Fxs (Delivered / Prescribed): 10 / 10     ====================================   The patient tolerated radiation. He noted resolution of his neck/headache pain as well as fatigue throughout his treatment.   The patient will return in one month and will continue follow up with Dr. Timmy as well.      Ronita Due, PA-C

## 2024-03-03 ENCOUNTER — Other Ambulatory Visit: Payer: Self-pay | Admitting: *Deleted

## 2024-03-03 DIAGNOSIS — R590 Localized enlarged lymph nodes: Secondary | ICD-10-CM

## 2024-03-03 DIAGNOSIS — C089 Malignant neoplasm of major salivary gland, unspecified: Secondary | ICD-10-CM

## 2024-03-04 ENCOUNTER — Inpatient Hospital Stay (HOSPITAL_BASED_OUTPATIENT_CLINIC_OR_DEPARTMENT_OTHER): Admitting: Hematology & Oncology

## 2024-03-04 ENCOUNTER — Other Ambulatory Visit (HOSPITAL_BASED_OUTPATIENT_CLINIC_OR_DEPARTMENT_OTHER): Payer: Self-pay

## 2024-03-04 ENCOUNTER — Encounter: Payer: Self-pay | Admitting: Hematology & Oncology

## 2024-03-04 ENCOUNTER — Encounter: Payer: Self-pay | Admitting: *Deleted

## 2024-03-04 ENCOUNTER — Inpatient Hospital Stay: Attending: Hematology & Oncology

## 2024-03-04 VITALS — BP 124/92 | HR 89 | Temp 98.3°F | Resp 20 | Wt 162.8 lb

## 2024-03-04 DIAGNOSIS — Z7952 Long term (current) use of systemic steroids: Secondary | ICD-10-CM | POA: Insufficient documentation

## 2024-03-04 DIAGNOSIS — C089 Malignant neoplasm of major salivary gland, unspecified: Secondary | ICD-10-CM

## 2024-03-04 DIAGNOSIS — Z79891 Long term (current) use of opiate analgesic: Secondary | ICD-10-CM | POA: Diagnosis not present

## 2024-03-04 DIAGNOSIS — R590 Localized enlarged lymph nodes: Secondary | ICD-10-CM

## 2024-03-04 DIAGNOSIS — M899 Disorder of bone, unspecified: Secondary | ICD-10-CM | POA: Diagnosis not present

## 2024-03-04 DIAGNOSIS — C801 Malignant (primary) neoplasm, unspecified: Secondary | ICD-10-CM | POA: Diagnosis not present

## 2024-03-04 DIAGNOSIS — Z79899 Other long term (current) drug therapy: Secondary | ICD-10-CM | POA: Diagnosis not present

## 2024-03-04 LAB — CBC WITH DIFFERENTIAL (CANCER CENTER ONLY)
Abs Immature Granulocytes: 0.11 K/uL — ABNORMAL HIGH (ref 0.00–0.07)
Basophils Absolute: 0 K/uL (ref 0.0–0.1)
Basophils Relative: 0 %
Eosinophils Absolute: 0 K/uL (ref 0.0–0.5)
Eosinophils Relative: 1 %
HCT: 40.8 % (ref 39.0–52.0)
Hemoglobin: 14.1 g/dL (ref 13.0–17.0)
Immature Granulocytes: 1 %
Lymphocytes Relative: 7 %
Lymphs Abs: 0.6 K/uL — ABNORMAL LOW (ref 0.7–4.0)
MCH: 31.3 pg (ref 26.0–34.0)
MCHC: 34.6 g/dL (ref 30.0–36.0)
MCV: 90.5 fL (ref 80.0–100.0)
Monocytes Absolute: 1 K/uL (ref 0.1–1.0)
Monocytes Relative: 12 %
Neutro Abs: 6.6 K/uL (ref 1.7–7.7)
Neutrophils Relative %: 79 %
Platelet Count: 269 K/uL (ref 150–400)
RBC: 4.51 MIL/uL (ref 4.22–5.81)
RDW: 15.3 % (ref 11.5–15.5)
WBC Count: 8.3 K/uL (ref 4.0–10.5)
nRBC: 0 % (ref 0.0–0.2)

## 2024-03-04 LAB — CMP (CANCER CENTER ONLY)
ALT: 66 U/L — ABNORMAL HIGH (ref 0–44)
AST: 23 U/L (ref 15–41)
Albumin: 3.9 g/dL (ref 3.5–5.0)
Alkaline Phosphatase: 67 U/L (ref 38–126)
Anion gap: 10 (ref 5–15)
BUN: 25 mg/dL — ABNORMAL HIGH (ref 6–20)
CO2: 26 mmol/L (ref 22–32)
Calcium: 9.1 mg/dL (ref 8.9–10.3)
Chloride: 100 mmol/L (ref 98–111)
Creatinine: 0.68 mg/dL (ref 0.61–1.24)
GFR, Estimated: >60 mL/min (ref >60)
Glucose, Bld: 86 mg/dL (ref 70–99)
Potassium: 4.3 mmol/L (ref 3.5–5.1)
Sodium: 136 mmol/L (ref 135–145)
Total Bilirubin: 0.6 mg/dL (ref 0.0–1.2)
Total Protein: 5.7 g/dL — ABNORMAL LOW (ref 6.5–8.1)

## 2024-03-04 LAB — LACTATE DEHYDROGENASE: LDH: 187 U/L (ref 98–192)

## 2024-03-04 MED ORDER — LIDOCAINE VISCOUS HCL 2 % MT SOLN: 15.0000 mL | OROMUCOSAL | 4 refills | Status: AC | PRN

## 2024-03-04 MED ORDER — FLUZONE 0.5 ML IM SUSY
0.5000 mL | PREFILLED_SYRINGE | Freq: Once | INTRAMUSCULAR | 0 refills | Status: AC
  Filled 2024-03-04: qty 0.5, 1d supply, fill #0

## 2024-03-04 NOTE — Progress Notes (Signed)
 BP elevated at 124/92, has not been taking Norvasc  because BP has been WNL. Instructed to monitor at home and if it remains over 140/90, notify PCP. Verbalzied understanding.

## 2024-03-04 NOTE — Progress Notes (Signed)
 Hematology and Oncology Follow Up Visit  Lance [name] [id number] September 15, 1965 58 y.o. 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  He actually looks quite good.  Despite the best I have seen him look.  He completed radiotherapy.  He has some radiation esophagitis right now.  I we will call in some viscous lidocaine  for him.  He does have hot flashes.  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  He actually looks quite good.  His color also looks healthy..  He really would like to go back to work.  Hopefully, we will be able to get him back to work in November.  He has had a little bit of perianal pruritus.  I told him to try some over-the-counter hydrocortisone cream for this.  He has had no problems with pain.  [name] not sleeping all that well.  I think the fact that he is on Decadron  is not sleeping well.  I told him that we will taper his Decadron  now.  I gave him schedule for tapering.     Overall, I would have to say that his performance status is probably ECOG 1.   Medications:  Current Outpatient Medications:    ALPRAZolam  (XANAX ) 0.25 MG tablet, Take 0.25 mg by mouth at bedtime as needed for sleep., Disp: , Rfl:    ALPRAZolam  (XANAX ) 0.5 MG tablet, Take 1 tablet (0.5 mg total) by mouth 2 (two) times daily as needed for anxiety., Disp: 30 tablet, Rfl: 0   dexamethasone  (DECADRON ) 4 MG tablet, Take 5 tablets (20 mg total) by mouth daily. You must take this with food.  Preferably, take this in the morning with breakfast., Disp: 120 tablet,  Rfl: 2   levothyroxine (SYNTHROID) 100 MCG tablet, Take 100 mcg by mouth daily., Disp: , Rfl:    pantoprazole  (PROTONIX ) 40 MG tablet, Take 1 tablet (40 mg total) by mouth 2 (two) times daily., Disp: 60 tablet, Rfl: 5   traMADol  (ULTRAM ) 50 MG tablet, Take 50 mg by mouth 2 (two) times daily., Disp: , Rfl:    zolpidem  (AMBIEN ) 10 MG tablet, Take 10 mg by mouth at bedtime., Disp: , Rfl:    amLODipine  (NORVASC ) 5 MG tablet, Take 1 tablet (5 mg total) by mouth daily. (Patient not taking: Reported on 03/04/2024), Disp: 30 tablet, Rfl: 6   bicalutamide  (CASODEX ) 50 MG tablet, Take 1 tablet (50 mg total) by mouth daily. (Patient not taking: Reported on 03/04/2024), Disp: 30 tablet, Rfl: 12   ondansetron  (ZOFRAN ) 4 MG tablet, Take 1 tablet (4 mg total) by mouth every 8 (eight) hours as needed for nausea or vomiting., Disp: 20 tablet, Rfl: 0   oxyCODONE  (OXY IR/ROXICODONE ) 5 MG immediate release tablet, Take 1-2 tablets (5-10 mg total) by mouth every 6 (six) hours as needed for severe pain (pain score 7-10). (Patient not taking: Reported on 03/04/2024), Disp: 90 tablet, Rfl: 0  Allergies:  Allergies  Allergen Reactions   Methimazole  Other (See Comments)    WBC drops   Methocarbamol Other (See Comments)  WBC drops    Past Medical History, Surgical history, Social history, and Family History were reviewed and updated.  Review of Systems: Review of Systems  Constitutional:  Positive for appetite change, fatigue and unexpected weight change.  HENT:  Negative.    Eyes: Negative.   Respiratory:  Positive for shortness of breath.   Cardiovascular: Negative.   Gastrointestinal:  Positive for nausea.  Endocrine: Negative.   Genitourinary: Negative.    Musculoskeletal:  Positive for back pain and myalgias.  Skin: Negative.   Neurological: Negative.   Hematological: Negative.   Psychiatric/Behavioral: Negative.      Physical Exam:  weight is 162 lb 12.8 oz (73.8 kg). His oral temperature is 98.3  F (36.8 C). His blood pressure is 124/92 (abnormal) and his pulse is 89. His respiration is 20 and oxygen saturation is 99%.   Wt Readings from Last 3 Encounters:  03/04/24 162 lb 12.8 oz (73.8 kg)  02/19/24 162 lb (73.5 kg)  02/17/24 158 lb (71.7 kg)    Physical Exam Vitals reviewed.  HENT:     Head: Normocephalic and atraumatic.  Eyes:     Pupils: Pupils are equal, round, and reactive to light.  Cardiovascular:     Rate and Rhythm: Normal rate and regular rhythm.     Heart sounds: Normal heart sounds.  Pulmonary:     Effort: Pulmonary effort is normal.     Breath sounds: Normal breath sounds.  Abdominal:     General: Bowel sounds are normal.     Palpations: Abdomen is soft.  Musculoskeletal:        General: No tenderness or deformity. Normal range of motion.     Cervical back: Normal range of motion.  Lymphadenopathy:     Cervical: No cervical adenopathy.  Skin:    General: Skin is warm and dry.     Findings: No erythema or rash.  Neurological:     Mental Status: [name] alert and oriented to person, place, and time.  Psychiatric:        Behavior: Behavior normal.        Thought Content: Thought content normal.        Judgment: Judgment normal.      Lab Results  Component Value Date   WBC 8.3 03/04/2024   HGB 14.1 03/04/2024   HCT 40.8 03/04/2024   MCV 90.5 03/04/2024   PLT 269 03/04/2024     Chemistry      Component Value Date/Time   NA 133 (L) 02/19/2024 1045   K 4.6 02/19/2024 1045   CL 99 02/19/2024 1045   CO2 27 02/19/2024 1045   BUN 13 02/19/2024 1045   CREATININE 0.80 02/19/2024 1045      Component Value Date/Time   CALCIUM 8.3 (L) 02/19/2024 1045   ALKPHOS 79 02/19/2024 1045   AST 23 02/19/2024 1045   ALT 72 (H) 02/19/2024 1045   BILITOT 0.7 02/19/2024 1045      Impression and Plan: Lance [name] is a very nice 58 year old white male.  He comes in with his wife.  I must say that this is a very unusual cancer.  I know our pathologists are  very thorough with their immunohistochemical studies.  Again, this tumor is strongly positive for androgen receptor.  So, we are treating him with antiandrogen therapy.  I know there are reports that show success with treating these salivary duct carcinomas with antiandrogen therapy.  We will go ahead and get him set up with a  PET scan in November.  Hopefully, we will see that he is responding.  He looks quite good.  Hopefully, this will equate to a response.  I will send in the viscous lidocaine .  I went over all of his medications as to what he should be taking.  I told him to restart his amlodipine .  Again I told him also to start his Casodex .  I will plan to get him back after he has a PET scan.   Maude JONELLE Crease, MD 10/14/20259:23 AM

## 2024-03-04 NOTE — Progress Notes (Unsigned)
 Patient has now completed radiation. He continues on AI. He will need post radiation PET. Scheduled for 04/04/2024.  Oncology Nurse Navigator Documentation     03/04/2024    9:00 AM  Oncology Nurse Navigator Flowsheets  Phase of Treatment Radiation  Radiation Actual End Date: 02/27/2024  Navigator Follow Up Date: 04/04/2024  Navigator Follow Up Reason: Scan Review  Navigator Location [facility]  Navigator Encounter Type Follow-up Appt  Patient Visit Type MedOnc  Treatment Phase Active Tx  Barriers/Navigation Needs No Barriers At This Time  Interventions None Required  Acuity Level 1-No Barriers  Support Groups/Services Friends and Family  Time Spent with Patient 15

## 2024-03-05 ENCOUNTER — Encounter: Payer: Self-pay | Admitting: Hematology & Oncology

## 2024-03-05 LAB — TESTOSTERONE: Testosterone: <3 ng/dL — ABNORMAL LOW (ref 264–916)

## 2024-03-06 ENCOUNTER — Telehealth: Payer: Self-pay

## 2024-03-06 ENCOUNTER — Ambulatory Visit
Admission: RE | Admit: 2024-03-06 | Discharge: 2024-03-06 | Disposition: A | Discharge status: Home / Self Care | Source: Ambulatory Visit | Attending: Radiation Oncology | Admitting: Radiation Oncology

## 2024-03-06 ENCOUNTER — Encounter: Payer: Self-pay | Admitting: Radiation Oncology

## 2024-03-06 DIAGNOSIS — C089 Malignant neoplasm of major salivary gland, unspecified: Secondary | ICD-10-CM

## 2024-03-06 DIAGNOSIS — C7951 Secondary malignant neoplasm of bone: Secondary | ICD-10-CM | POA: Diagnosis not present

## 2024-03-06 DIAGNOSIS — J029 Acute pharyngitis, unspecified: Secondary | ICD-10-CM | POA: Diagnosis not present

## 2024-03-06 DIAGNOSIS — Z510 Encounter for antineoplastic radiation therapy: Secondary | ICD-10-CM | POA: Diagnosis not present

## 2024-03-06 DIAGNOSIS — E860 Dehydration: Secondary | ICD-10-CM | POA: Diagnosis not present

## 2024-03-06 HISTORY — DX: Personal history of irradiation: Z92.3

## 2024-03-06 MED ORDER — FLUCONAZOLE 100 MG PO TABS: 100.0000 mg | ORAL_TABLET | Freq: Every day | ORAL | 0 refills | Status: DC

## 2024-03-06 MED ORDER — OXYCODONE-ACETAMINOPHEN 5-325 MG PO TABS: 1.0000 | ORAL_TABLET | Freq: Four times a day (QID) | ORAL | 0 refills | Status: DC | PRN

## 2024-03-06 MED ORDER — SUCRALFATE 1 G PO TABS: 1.0000 g | ORAL_TABLET | Freq: Three times a day (TID) | ORAL | 1 refills | Status: AC

## 2024-03-06 NOTE — Telephone Encounter (Signed)
 Patient called in to report sore throat and painful swallowing and white patches to throat. Patient is requesting to come in and be seen. Patient completed treatment to Lumbar spine and C- spine on 02/27/24.

## 2024-03-06 NOTE — Progress Notes (Signed)
 Lance [name] is here today for follow up post radiation to the lumbar spine and cervical spine. Patient completed treatment on 02/27/24.   Does the patient complain of any of the following: Pain: yes to throat. Reports having white patches in the back of his throat.  Shortness of breath w/wo exertion: No Cough: No Pain with swallowing: Yes. Patient reports using salt water with no relief.  Appetite: Good but he has not been able to eat.  Energy Level: Good  Post radiation skin Changes: No     Additional comments if applicable:   BP (P) 119/88 (BP Location: Left Arm, Patient Position: Sitting)   Pulse (P) 95   Temp (!) (P) 96.8 F (36 C) (Temporal)   Resp (P) 18   Ht (P) 5' 8 (1.727 m)   Wt (P) 158 lb 2 oz (71.7 kg)   BMI (P) 24.04 kg/m

## 2024-03-06 NOTE — Progress Notes (Signed)
 Radiation Oncology         [phone] ________________________________  Name: Lance [name] MRN: [id number]  Date: 03/06/2024  DOB: [birth year]  Follow-Up Visit Note  CC: Larnell Hamilton, MD  Timmy Maude SAUNDERS, MD    ICD-10-CM   1. 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       Diagnosis:   Metastatic salivary gland carcinoma  Interval Since Last Radiation:  8 days  Narrative: Patient requested to be seen earlier than his scheduled follow-up appointment for pharyngitis issues.   He has been using oxycodone  for pain as well as viscous lidocaine .  [name] on a Decadron  taper through medical oncology.  He reports poor taste.  He denies any pain in the lower back or abdominal area at this time.  He has pain in the occipital /upper neck area has improved with his palliative radiation therapy.                        ALLERGIES:  is allergic to methimazole  and methocarbamol.  Meds: Current Outpatient Medications  Medication Sig Dispense Refill   fluconazole (DIFLUCAN) 100 MG tablet Take 1 tablet (100 mg total) by mouth daily. 7 tablet 0   oxyCODONE -acetaminophen  (PERCOCET/ROXICET) 5-325 MG tablet Take 1 tablet by mouth every 6 (six) hours as needed for severe pain (pain score 7-10). 30 tablet 0   sucralfate (CARAFATE) 1 g tablet Take 1 tablet (1 g total) by mouth 4 (four) times daily -  with meals and at bedtime. Crush and dissolve in 10 mL of warm water prior to swallowing, take 20 to 30 minutes prior to meals 120 tablet 1   ALPRAZolam  (XANAX ) 0.25 MG tablet Take 0.25 mg by mouth at bedtime as needed for sleep.     ALPRAZolam  (XANAX ) 0.5 MG tablet Take 1 tablet (0.5 mg total) by mouth 2 (two) times daily as needed for anxiety. 30 tablet 0   amLODipine  (NORVASC ) 5 MG tablet Take 1 tablet (5 mg total) by mouth daily. (Patient not taking: Reported on 03/04/2024) 30 tablet 6   bicalutamide  (CASODEX ) 50 MG tablet Take 1 tablet (50 mg total) by mouth daily.  (Patient not taking: Reported on 03/04/2024) 30 tablet 12   dexamethasone  (DECADRON ) 4 MG tablet Take 5 tablets (20 mg total) by mouth daily. You must take this with food.  Preferably, take this in the morning with breakfast. 120 tablet 2   levothyroxine (SYNTHROID) 100 MCG tablet Take 100 mcg by mouth daily.     lidocaine  (XYLOCAINE ) 2 % solution Use as directed 15 mLs in the mouth or throat every 4 (four) hours as needed for mouth pain. 500 mL 4   ondansetron  (ZOFRAN ) 4 MG tablet Take 1 tablet (4 mg total) by mouth every 8 (eight) hours as needed for nausea or vomiting. 20 tablet 0   oxyCODONE  (OXY IR/ROXICODONE ) 5 MG immediate release tablet Take 1-2 tablets (5-10 mg total) by mouth every 6 (six) hours as needed for severe pain (pain score 7-10). (Patient not taking: Reported on 03/04/2024) 90 tablet 0   pantoprazole  (PROTONIX ) 40 MG tablet Take 1 tablet (40 mg total) by mouth 2 (two) times daily. 60 tablet 5   traMADol  (ULTRAM ) 50 MG tablet Take 50 mg by mouth 2 (two) times daily.     zolpidem  (AMBIEN ) 10 MG tablet Take 10 mg by mouth at bedtime.     No current  facility-administered medications for this encounter.    Physical Findings: The patient is in no acute distress. Patient is alert and oriented.  height is 5' 8 (1.727 m) (pended) and weight is 158 lb 2 oz (71.7 kg) (pended). His temporal temperature is 96.8 F (36 C) (abnormal, pended). His blood pressure is 119/88 (pended) and his pulse is 95 (pended). His respiration is 18 (pended). .  The lungs are clear.  The heart has a regular rhythm and rate.  Examination of the oral cavity reveals significant pharyngitis along the posterior pharynx with possibly candidal infection in addition. Lab Findings: Lab Results  Component Value Date   WBC 8.3 03/04/2024   HGB 14.1 03/04/2024   HCT 40.8 03/04/2024   MCV 90.5 03/04/2024   PLT 269 03/04/2024    Radiographic Findings: CT ABDOMEN PELVIS W CONTRAST Result Date: 02/17/2024 CLINICAL  DATA:  Generalized abdominal pain and nausea for 4 days, known stage IV cancer EXAM: CT ABDOMEN AND PELVIS WITH CONTRAST TECHNIQUE: Multidetector CT imaging of the abdomen and pelvis was performed using the standard protocol following bolus administration of intravenous contrast. RADIATION DOSE REDUCTION: This exam was performed according to the departmental dose-optimization program which includes automated exposure control, adjustment of the mA and/or kV according to patient size and/or use of iterative reconstruction technique. CONTRAST:  OMNIPAQUE  IOHEXOL  300 MG/ML  SOLN COMPARISON:  11/22/2023, 12/10/2023 FINDINGS: Lower chest: No acute pleural or parenchymal lung disease. Hepatobiliary: Hepatic steatosis. No focal liver abnormality. The gallbladder is unremarkable. Pancreas: Unremarkable. No pancreatic ductal dilatation or surrounding inflammatory changes. Spleen: Hypodensity seen within the spleen on prior studies are less pronounced on this exam, largest now measuring 2.2 cm. No splenomegaly. Adrenals/Urinary Tract: Stable bilateral renal cortical cysts. No urinary tract calculi or obstructive uropathy within either kidney. The adrenals are stable. Bladder is unremarkable. Stomach/Bowel: No bowel obstruction or ileus. Normal appendix right lower quadrant. Segmental mural thickening within the distal jejunum and ileum within the right lower abdomen, compatible with inflammatory or infectious enteritis. Vascular/Lymphatic: Aortic atherosclerosis. No enlarged abdominal or pelvic lymph nodes. Reproductive: Prostate is unremarkable. Other: No free fluid or free intraperitoneal gas. No abdominal wall hernia. Musculoskeletal: Bony metastases are again noted at T11, L4, and L5. Prior vertebral augmentation at L5. No acute displaced fractures. Reconstructed images demonstrate no additional findings. IMPRESSION: 1. Segmental mural thickening involving the distal jejunum and ileum, compatible with inflammatory or  infectious enteritis. No bowel obstruction or ileus. 2. Stable metastases within the thoracolumbar spine. 3. Persistent splenic hypodensities compatible with known metastatic lesions, decreased in prominence on this study. 4.  Aortic Atherosclerosis (ICD10-I70.0). 5. Hepatic steatosis. Electronically Signed   By: Ozell Daring M.D.   On: 02/17/2024 23:30   MR Brain W Wo Contrast Result Date: 02/06/2024 CLINICAL DATA:  Metastatic disease evaluation. EXAM: MRI HEAD WITHOUT AND WITH CONTRAST TECHNIQUE: Multiplanar, multiecho pulse sequences of the brain and surrounding structures were obtained without and with intravenous contrast. CONTRAST:  7mL GADAVIST  GADOBUTROL  1 MMOL/ML IV SOLN COMPARISON:  Head MRI 01/05/2022 FINDINGS: Brain: There is no evidence of an acute infarct, intracranial hemorrhage, mass, midline shift, or extra-axial fluid collection. Cerebral volume is normal. The ventricles are normal in size. Scattered small foci T2 hyperintensity/nonspecific gliosis in the bifrontal white matter are mild for age and unchanged. No abnormal enhancement is identified. Vascular: Major intracranial vascular flow voids are preserved. Skull and upper cervical spine: New 1 cm T1 hypointense, enhancing lesion in the anterior C2 vertebral body with hypermetabolism on  a 12/10/2023 PET. Sinuses/Orbits: Unremarkable orbits. Paranasal sinuses and mastoid air cells are clear. Other: None. IMPRESSION: 1. No evidence of intracranial metastases. 2. New C2 vertebral metastasis. Electronically Signed   By: Dasie Hamburg M.D.   On: 02/06/2024 15:46    Impression:   He reports improvement in his lower back pain which is palliative treatment.  His occipital upper and posterior neck area pain is also improved with his palliative treatments.  [name] a consequence of his most recent treatment has developed pharyngitis.  He may also have a candidal infection in addition with his ongoing Decadron .  Plan: I recommended he continue  using his viscous lidocaine .  I have also given him a prescription for Carafate to use as a suspension.  He has also been placed on Diflucan for presumed candidal infection.  Addendum:  Patient's pharmacy faxed information over concerning potential interaction with his Xanax  and Diflucan.  I have asked the patient to hold off on his Xanax  while he is on Diflucan and he agrees.  He will continue to follow-up closely with medical oncology.  I have asked him to call me if his pharyngitis does not improve over the next week.  ____________________________________ Lynwood Nasuti, MD

## 2024-03-12 ENCOUNTER — Encounter: Payer: Self-pay | Admitting: Radiation Oncology

## 2024-03-12 ENCOUNTER — Telehealth: Payer: Self-pay

## 2024-03-12 ENCOUNTER — Ambulatory Visit
Admission: RE | Admit: 2024-03-12 | Discharge: 2024-03-12 | Disposition: A | Discharge status: Home / Self Care | Source: Ambulatory Visit | Attending: Radiation Oncology | Admitting: Radiation Oncology

## 2024-03-12 ENCOUNTER — Other Ambulatory Visit: Payer: Self-pay

## 2024-03-12 VITALS — BP 139/101 | HR 100 | Temp 97.6°F | Resp 18 | Ht 68.0 in | Wt 157.0 lb

## 2024-03-12 DIAGNOSIS — I700 Atherosclerosis of aorta: Secondary | ICD-10-CM | POA: Insufficient documentation

## 2024-03-12 DIAGNOSIS — Z79899 Other long term (current) drug therapy: Secondary | ICD-10-CM | POA: Insufficient documentation

## 2024-03-12 DIAGNOSIS — Z79890 Hormone replacement therapy (postmenopausal): Secondary | ICD-10-CM | POA: Insufficient documentation

## 2024-03-12 DIAGNOSIS — J029 Acute pharyngitis, unspecified: Secondary | ICD-10-CM | POA: Insufficient documentation

## 2024-03-12 DIAGNOSIS — Z79891 Long term (current) use of opiate analgesic: Secondary | ICD-10-CM | POA: Insufficient documentation

## 2024-03-12 DIAGNOSIS — C089 Malignant neoplasm of major salivary gland, unspecified: Secondary | ICD-10-CM | POA: Insufficient documentation

## 2024-03-12 DIAGNOSIS — E860 Dehydration: Secondary | ICD-10-CM | POA: Insufficient documentation

## 2024-03-12 DIAGNOSIS — C7951 Secondary malignant neoplasm of bone: Secondary | ICD-10-CM | POA: Insufficient documentation

## 2024-03-12 DIAGNOSIS — K760 Fatty (change of) liver, not elsewhere classified: Secondary | ICD-10-CM | POA: Insufficient documentation

## 2024-03-12 DIAGNOSIS — Z510 Encounter for antineoplastic radiation therapy: Secondary | ICD-10-CM | POA: Diagnosis not present

## 2024-03-12 DIAGNOSIS — N281 Cyst of kidney, acquired: Secondary | ICD-10-CM | POA: Insufficient documentation

## 2024-03-12 DIAGNOSIS — Z923 Personal history of irradiation: Secondary | ICD-10-CM | POA: Insufficient documentation

## 2024-03-12 LAB — CMP (CANCER CENTER ONLY)
ALT: 79 U/L — ABNORMAL HIGH (ref 0–44)
AST: 25 U/L (ref 15–41)
Albumin: 3.9 g/dL (ref 3.5–5.0)
Alkaline Phosphatase: 58 U/L (ref 38–126)
Anion gap: 7 (ref 5–15)
BUN: 25 mg/dL — ABNORMAL HIGH (ref 6–20)
CO2: 24 mmol/L (ref 22–32)
Calcium: 9.1 mg/dL (ref 8.9–10.3)
Chloride: 101 mmol/L (ref 98–111)
Creatinine: 0.82 mg/dL (ref 0.61–1.24)
GFR, Estimated: >60 mL/min (ref >60)
Glucose, Bld: 120 mg/dL — ABNORMAL HIGH (ref 70–99)
Potassium: 4.2 mmol/L (ref 3.5–5.1)
Sodium: 132 mmol/L — ABNORMAL LOW (ref 135–145)
Total Bilirubin: 0.9 mg/dL (ref 0.0–1.2)
Total Protein: 6.1 g/dL — ABNORMAL LOW (ref 6.5–8.1)

## 2024-03-12 LAB — CBC WITH DIFFERENTIAL (CANCER CENTER ONLY)
Abs Immature Granulocytes: 0.07 K/uL (ref 0.00–0.07)
Basophils Absolute: 0 K/uL (ref 0.0–0.1)
Basophils Relative: 0 %
Eosinophils Absolute: 0 K/uL (ref 0.0–0.5)
Eosinophils Relative: 0 %
HCT: 41.8 % (ref 39.0–52.0)
Hemoglobin: 15 g/dL (ref 13.0–17.0)
Immature Granulocytes: 1 %
Lymphocytes Relative: 4 %
Lymphs Abs: 0.3 K/uL — ABNORMAL LOW (ref 0.7–4.0)
MCH: 31.3 pg (ref 26.0–34.0)
MCHC: 35.9 g/dL (ref 30.0–36.0)
MCV: 87.3 fL (ref 80.0–100.0)
Monocytes Absolute: 0.4 K/uL (ref 0.1–1.0)
Monocytes Relative: 5 %
Neutro Abs: 7.3 K/uL (ref 1.7–7.7)
Neutrophils Relative %: 90 %
Platelet Count: 223 K/uL (ref 150–400)
RBC: 4.79 MIL/uL (ref 4.22–5.81)
RDW: 15.5 % (ref 11.5–15.5)
WBC Count: 8.1 K/uL (ref 4.0–10.5)
nRBC: 0 % (ref 0.0–0.2)

## 2024-03-12 MED ORDER — ACETAMINOPHEN 325 MG PO TABS
ORAL_TABLET | ORAL | Status: AC
  Filled 2024-03-12: qty 2

## 2024-03-12 MED ORDER — ACETAMINOPHEN 325 MG PO TABS
650.0000 mg | ORAL_TABLET | Freq: Once | ORAL | Status: AC
  Administered 2024-03-12: 650 mg via ORAL

## 2024-03-12 MED ORDER — SODIUM CHLORIDE 0.9 % IV SOLN
INTRAVENOUS | Status: DC
  Filled 2024-03-12 (×2): qty 150

## 2024-03-12 MED ORDER — HYDROCODONE-ACETAMINOPHEN 7.5-325 MG/15ML PO SOLN: 10.0000 mL | ORAL | 0 refills | Status: DC | PRN

## 2024-03-12 NOTE — Telephone Encounter (Signed)
 Patient called in to report increased pain with swallowing and ear ache. Patient reports pain did not improve after completing diflucan. Patient reports throat feels raw when eating and drinking. Please advise.

## 2024-03-12 NOTE — Progress Notes (Addendum)
 Lance [name] is here today for follow up post radiation to the lumbar spine and cervical spine.  Patient completed treatment on 02/27/24.    Does the patient complain of any of the following: Pain: yes reports throat pain. Rates at 7/10.  No relief from oxycodone -apap, carafate, or viscous lidocaine . Shortness of breath w/wo exertion: No Pain with swallowing: Yes,severe, reports throat feels raw.  Appetite: patient has appetite but unable to eat. Energy Level: Good      Additional comments if applicable:  BP (!) 139/101 (BP Location: Left Arm, Patient Position: Sitting, Cuff Size: Large)   Pulse 100   Temp 97.6 F (36.4 C)   Resp 18   Ht 5' 8 (1.727 m)   Wt 157 lb (71.2 kg)   SpO2 99%   BMI 23.87 kg/m

## 2024-03-12 NOTE — Progress Notes (Signed)
 Radiation Oncology         [phone] ________________________________  Name: Lance [name] MRN: [id number]  Date: 03/12/2024  DOB: February 17, 1966  Follow-Up Visit Note  CC: Larnell Hamilton, MD  Larnell Hamilton, MD    ICD-10-CM   1. 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; s/p palliative radiation to the lumbar and cervical spine completed on 02/27/2024  Interval Since Last Radiation:  16 days  ==========DELIVERED PLANS==========  First Treatment Date: 2024-01-29 Last Treatment Date: 2024-02-27   Plan Name: Spine_Lumbar Site: Lumbar Spine Technique: 3D Mode: Photon Dose Per Fraction: 3 Gy Prescribed Dose (Delivered / Prescribed): 30 Gy / 30 Gy Prescribed Fxs (Delivered / Prescribed): 10 / 10   Plan Name: Spine_C Site: Cervical Spine Technique: 3D Mode: Photon Dose Per Fraction: 3 Gy Prescribed Dose (Delivered / Prescribed): 30 Gy / 30 Gy Prescribed Fxs (Delivered / Prescribed): 10 / 10  ====================================   Narrative: Patient called in to report worsening symptoms and would like to be seen again.   Patient states that his throat pain has worsened since he was last seen in our clinic on 03/06/2024. He has not been able to tolerate the Carafate and states that the lidocaine  is not helping his pain. He has not been taking anything for pain. He notes bilateral ear pain as well. He as not been able to tolerate swallowing foods. He has been eating popsicles. He denies any drainage, cough, fevers, or chills.      ALLERGIES:  is allergic to methimazole  and methocarbamol.  Meds: Current Outpatient Medications  Medication Sig Dispense Refill   HYDROcodone -acetaminophen  (HYCET) 7.5-325 mg/15 ml solution Take 10 mLs by mouth every 4 (four) hours as needed for moderate pain (pain score 4-6). 120  mL 0   ALPRAZolam  (XANAX ) 0.25 MG tablet Take 0.25 mg by mouth at bedtime as needed for sleep.     ALPRAZolam  (XANAX ) 0.5 MG tablet Take 1 tablet (0.5 mg total) by mouth 2 (two) times daily as needed for anxiety. 30 tablet 0   amLODipine  (NORVASC ) 5 MG tablet Take 1 tablet (5 mg total) by mouth daily. (Patient not taking: Reported on 03/04/2024) 30 tablet 6   bicalutamide  (CASODEX ) 50 MG tablet Take 1 tablet (50 mg total) by mouth daily. (Patient not taking: Reported on 03/04/2024) 30 tablet 12   dexamethasone  (DECADRON ) 4 MG tablet Take 5 tablets (20 mg total) by mouth daily. You must take this with food.  Preferably, take this in the morning with breakfast. 120 tablet 2   fluconazole (DIFLUCAN) 100 MG tablet Take 1 tablet (100 mg total) by mouth daily. 7 tablet 0   levothyroxine (SYNTHROID) 100 MCG tablet Take 100 mcg by mouth daily.     lidocaine  (XYLOCAINE ) 2 % solution Use as directed 15 mLs in the mouth or throat every 4 (four) hours as needed for mouth pain. 500 mL 4   ondansetron  (ZOFRAN ) 4 MG tablet Take 1 tablet (4 mg total) by mouth every 8 (eight) hours as needed for nausea or vomiting. 20 tablet 0   pantoprazole  (PROTONIX ) 40 MG tablet Take 1 tablet (40 mg total) by mouth 2 (two) times daily. 60 tablet 5   sucralfate (CARAFATE) 1 g tablet Take 1 tablet (1 g total) by mouth 4 (four) times daily -  with meals and at bedtime.  Crush and dissolve in 10 mL of warm water prior to swallowing, take 20 to 30 minutes prior to meals 120 tablet 1   traMADol  (ULTRAM ) 50 MG tablet Take 50 mg by mouth 2 (two) times daily.     zolpidem  (AMBIEN ) 10 MG tablet Take 10 mg by mouth at bedtime.     Current Facility-Administered Medications  Medication Dose Route Frequency Provider Last Rate Last Admin   0.9 %  sodium chloride  infusion   Intravenous Continuous Wyatt Leeroy HERO, PA-C 500 mL/hr at 03/12/24 1250 New Bag at 03/12/24 1250    Physical Findings: The patient is in no acute distress. Patient is alert  and oriented.  height is 5' 8 (1.727 m) and weight is 157 lb (71.2 kg). His temperature is 97.6 F (36.4 C). His blood pressure is 139/101 (abnormal) and his pulse is 100. His respiration is 18 and oxygen saturation is 99%. .    Examination of the oral cavity reveals significant pharyngitis along the posterior pharynx and soft palate. No signs of thrush on the tongue. External ear canal exam limited by significant earwax. Left TM is clear with no bulging or erythema.    Lab Findings: Lab Results  Component Value Date   WBC 8.1 03/12/2024   HGB 15.0 03/12/2024   HCT 41.8 03/12/2024   MCV 87.3 03/12/2024   PLT 223 03/12/2024    Radiographic Findings: CT ABDOMEN PELVIS W CONTRAST Result Date: 02/17/2024 CLINICAL DATA:  Generalized abdominal pain and nausea for 4 days, known stage IV cancer EXAM: CT ABDOMEN AND PELVIS WITH CONTRAST TECHNIQUE: Multidetector CT imaging of the abdomen and pelvis was performed using the standard protocol following bolus administration of intravenous contrast. RADIATION DOSE REDUCTION: This exam was performed according to the departmental dose-optimization program which includes automated exposure control, adjustment of the mA and/or kV according to patient size and/or use of iterative reconstruction technique. CONTRAST:  OMNIPAQUE  IOHEXOL  300 MG/ML  SOLN COMPARISON:  11/22/2023, 12/10/2023 FINDINGS: Lower chest: No acute pleural or parenchymal lung disease. Hepatobiliary: Hepatic steatosis. No focal liver abnormality. The gallbladder is unremarkable. Pancreas: Unremarkable. No pancreatic ductal dilatation or surrounding inflammatory changes. Spleen: Hypodensity seen within the spleen on prior studies are less pronounced on this exam, largest now measuring 2.2 cm. No splenomegaly. Adrenals/Urinary Tract: Stable bilateral renal cortical cysts. No urinary tract calculi or obstructive uropathy within either kidney. The adrenals are stable. Bladder is unremarkable.  Stomach/Bowel: No bowel obstruction or ileus. Normal appendix right lower quadrant. Segmental mural thickening within the distal jejunum and ileum within the right lower abdomen, compatible with inflammatory or infectious enteritis. Vascular/Lymphatic: Aortic atherosclerosis. No enlarged abdominal or pelvic lymph nodes. Reproductive: Prostate is unremarkable. Other: No free fluid or free intraperitoneal gas. No abdominal wall hernia. Musculoskeletal: Bony metastases are again noted at T11, L4, and L5. Prior vertebral augmentation at L5. No acute displaced fractures. Reconstructed images demonstrate no additional findings. IMPRESSION: 1. Segmental mural thickening involving the distal jejunum and ileum, compatible with inflammatory or infectious enteritis. No bowel obstruction or ileus. 2. Stable metastases within the thoracolumbar spine. 3. Persistent splenic hypodensities compatible with known metastatic lesions, decreased in prominence on this study. 4.  Aortic Atherosclerosis (ICD10-I70.0). 5. Hepatic steatosis. Electronically Signed   By: Ozell Daring M.D.   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 He has not been able to tolerate Carafate and he states the lidocaine  is not helping his pain. Significant pharyngitis appreciated on physical exam today. CBC demonstrates no signs of infection. CMP shows signs of dehydration.   IV fluids given in our office today to help with dehydration. Patient reported feeling much better after this.   Plan: We have scheduled him for IV fluids on Friday and Monday PRN. We discussed typical course of healing, patient's symptoms should continue to improve as time progresses. Prescription for Hycet sent to his pharmacy. We will see him on Monday for a  follow-up.   I personally spent 20 minutes in this encounter including chart review, reviewing radiological studies, meeting face-to-face with the patient, entering orders and completing documentation.  ____________________________________    Leeroy Due, PA-C

## 2024-03-13 ENCOUNTER — Ambulatory Visit: Payer: Self-pay | Admitting: Radiation Oncology

## 2024-03-14 ENCOUNTER — Ambulatory Visit
Admission: RE | Admit: 2024-03-14 | Discharge: 2024-03-14 | Disposition: A | Discharge status: Home / Self Care | Source: Ambulatory Visit | Attending: Internal Medicine | Admitting: Internal Medicine

## 2024-03-14 ENCOUNTER — Encounter: Payer: Self-pay | Admitting: *Deleted

## 2024-03-14 ENCOUNTER — Other Ambulatory Visit: Payer: Self-pay | Admitting: Radiology

## 2024-03-14 VITALS — BP 138/101 | HR 83 | Temp 97.1°F | Resp 18 | Ht 68.0 in | Wt 161.4 lb

## 2024-03-14 DIAGNOSIS — Z79899 Other long term (current) drug therapy: Secondary | ICD-10-CM | POA: Diagnosis not present

## 2024-03-14 DIAGNOSIS — C7951 Secondary malignant neoplasm of bone: Secondary | ICD-10-CM

## 2024-03-14 DIAGNOSIS — C089 Malignant neoplasm of major salivary gland, unspecified: Secondary | ICD-10-CM | POA: Diagnosis not present

## 2024-03-14 MED ORDER — SODIUM CHLORIDE 0.9 % IV SOLN
INTRAVENOUS | Status: DC
  Filled 2024-03-14 (×2): qty 150

## 2024-03-14 MED ORDER — HYDROCODONE-ACETAMINOPHEN 7.5-325 MG/15ML PO SOLN: 10.0000 mL | ORAL | 0 refills | Status: DC | PRN

## 2024-03-15 NOTE — Progress Notes (Signed)
 Radiation Oncology         (336) [phone] ________________________________  Name: Lance [name] MRN: [id number]  Date: 03/17/2024  DOB: 12-17-1965  Follow-Up Visit Note  CC: Larnell Hamilton, MD  Larnell Hamilton, MD  No diagnosis found.  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; s/p palliative radiation to the lumbar and cervical spine completed on 02/27/2024   Interval Since Last Radiation:  19 days   ==========DELIVERED PLANS==========  First Treatment Date: 2024-01-29 Last Treatment Date: 2024-02-27   Plan Name: Spine_Lumbar Site: Lumbar Spine Technique: 3D Mode: Photon Dose Per Fraction: 3 Gy Prescribed Dose (Delivered / Prescribed): 30 Gy / 30 Gy Prescribed Fxs (Delivered / Prescribed): 10 / 10   Plan Name: Spine_C Site: Cervical Spine Technique: 3D Mode: Photon Dose Per Fraction: 3 Gy Prescribed Dose (Delivered / Prescribed): 30 Gy / 30 Gy Prescribed Fxs (Delivered / Prescribed): 10 / 10  Narrative:  The patient returns today for a symptom check. To review, he was last seen here on 03/12/24 due to worsening throat pain. He reported that he was not been able to tolerate Carafate and stated that the lidocaine  did not help his pain. His pain was severe enough to impact his ability to eat solid foods and he endorsed bilateral ear pain as well.  Exam performed at that time was notable for significant pharyngitis and his CMP showed signs of dehydration. He was given IV fluids and reported feeling significantly better. Given his positive response, we scheduled him to return for IV fluids on Friday and today if needed. A prescription for Hycet was also ordered.  Today, he reports ***.          Of note: he is scheduled for a restaging PET scan for 04/04/24.                           Allergies:  is allergic to methimazole  and methocarbamol.  Meds: Current Outpatient Medications  Medication Sig Dispense Refill   ALPRAZolam  (XANAX ) 0.25 MG  tablet Take 0.25 mg by mouth at bedtime as needed for sleep.     ALPRAZolam  (XANAX ) 0.5 MG tablet Take 1 tablet (0.5 mg total) by mouth 2 (two) times daily as needed for anxiety. 30 tablet 0   amLODipine  (NORVASC ) 5 MG tablet Take 1 tablet (5 mg total) by mouth daily. (Patient not taking: Reported on 03/04/2024) 30 tablet 6   bicalutamide  (CASODEX ) 50 MG tablet Take 1 tablet (50 mg total) by mouth daily. (Patient not taking: Reported on 03/04/2024) 30 tablet 12   dexamethasone  (DECADRON ) 4 MG tablet Take 5 tablets (20 mg total) by mouth daily. You must take this with food.  Preferably, take this in the morning with breakfast. 120 tablet 2   fluconazole (DIFLUCAN) 100 MG tablet Take 1 tablet (100 mg total) by mouth daily. 7 tablet 0   HYDROcodone -acetaminophen  (HYCET) 7.5-325 mg/15 ml solution Take 10 mLs by mouth every 4 (four) hours as needed for moderate pain (pain score 4-6). 120 mL 0   levothyroxine (SYNTHROID) 100 MCG tablet Take 100 mcg by mouth daily.     lidocaine  (XYLOCAINE ) 2 % solution Use as directed 15 mLs in the mouth or throat every 4 (four) hours as needed for mouth pain. 500 mL 4   ondansetron  (ZOFRAN ) 4 MG tablet Take 1 tablet (4 mg total) by mouth every 8 (eight) hours as needed for nausea or vomiting. 20 tablet 0   pantoprazole  (PROTONIX )  40 MG tablet Take 1 tablet (40 mg total) by mouth 2 (two) times daily. 60 tablet 5   sucralfate (CARAFATE) 1 g tablet Take 1 tablet (1 g total) by mouth 4 (four) times daily -  with meals and at bedtime. Crush and dissolve in 10 mL of warm water prior to swallowing, take 20 to 30 minutes prior to meals 120 tablet 1   traMADol  (ULTRAM ) 50 MG tablet Take 50 mg by mouth 2 (two) times daily.     zolpidem  (AMBIEN ) 10 MG tablet Take 10 mg by mouth at bedtime.     No current facility-administered medications for this encounter.    Physical Findings: The patient is in no acute distress. Patient is alert and oriented.  vitals were not taken for this  visit. .  No significant changes. Lungs are clear to auscultation bilaterally. Heart has regular rate and rhythm. No palpable cervical, supraclavicular, or axillary adenopathy. Abdomen soft, non-tender, normal bowel sounds.   Lab Findings: Lab Results  Component Value Date   WBC 8.1 03/12/2024   HGB 15.0 03/12/2024   HCT 41.8 03/12/2024   MCV 87.3 03/12/2024   PLT 223 03/12/2024    Radiographic Findings: CT ABDOMEN PELVIS W CONTRAST Result Date: 02/17/2024 CLINICAL DATA:  Generalized abdominal pain and nausea for 4 days, known stage IV cancer EXAM: CT ABDOMEN AND PELVIS WITH CONTRAST TECHNIQUE: Multidetector CT imaging of the abdomen and pelvis was performed using the standard protocol following bolus administration of intravenous contrast. RADIATION DOSE REDUCTION: This exam was performed according to the departmental dose-optimization program which includes automated exposure control, adjustment of the mA and/or kV according to patient size and/or use of iterative reconstruction technique. CONTRAST:  OMNIPAQUE  IOHEXOL  300 MG/ML  SOLN COMPARISON:  11/22/2023, 12/10/2023 FINDINGS: Lower chest: No acute pleural or parenchymal lung disease. Hepatobiliary: Hepatic steatosis. No focal liver abnormality. The gallbladder is unremarkable. Pancreas: Unremarkable. No pancreatic ductal dilatation or surrounding inflammatory changes. Spleen: Hypodensity seen within the spleen on prior studies are less pronounced on this exam, largest now measuring 2.2 cm. No splenomegaly. Adrenals/Urinary Tract: Stable bilateral renal cortical cysts. No urinary tract calculi or obstructive uropathy within either kidney. The adrenals are stable. Bladder is unremarkable. Stomach/Bowel: No bowel obstruction or ileus. Normal appendix right lower quadrant. Segmental mural thickening within the distal jejunum and ileum within the right lower abdomen, compatible with inflammatory or infectious enteritis. Vascular/Lymphatic:  Aortic atherosclerosis. No enlarged abdominal or pelvic lymph nodes. Reproductive: Prostate is unremarkable. Other: No free fluid or free intraperitoneal gas. No abdominal wall hernia. Musculoskeletal: Bony metastases are again noted at T11, L4, and L5. Prior vertebral augmentation at L5. No acute displaced fractures. Reconstructed images demonstrate no additional findings. IMPRESSION: 1. Segmental mural thickening involving the distal jejunum and ileum, compatible with inflammatory or infectious enteritis. No bowel obstruction or ileus. 2. Stable metastases within the thoracolumbar spine. 3. Persistent splenic hypodensities compatible with known metastatic lesions, decreased in prominence on this study. 4.  Aortic Atherosclerosis (ICD10-I70.0). 5. Hepatic steatosis. Electronically Signed   By: Ozell Daring M.D.   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  ***  Plan:  ***   *** minutes of total time was spent for this patient encounter, including preparation, face-to-face counseling with the patient and coordination of care, physical exam, and documentation  of the encounter. ____________________________________  Lynwood CHARM Nasuti, PhD, MD  This document serves as a record of services personally performed by Lynwood Nasuti, MD. It was created on his behalf by Dorthy Fuse, a trained medical scribe. The creation of this record is based on the scribe's personal observations and the provider's statements to them. This document has been checked and approved by the attending provider.

## 2024-03-17 ENCOUNTER — Ambulatory Visit
Admission: RE | Admit: 2024-03-17 | Discharge: 2024-03-17 | Disposition: A | Discharge status: Home / Self Care | Source: Ambulatory Visit | Attending: Radiation Oncology | Admitting: Radiation Oncology

## 2024-03-17 ENCOUNTER — Encounter: Payer: Self-pay | Admitting: Radiation Oncology

## 2024-03-17 VITALS — BP 130/90 | Temp 96.9°F | Resp 18 | Ht 68.0 in | Wt 161.0 lb

## 2024-03-17 DIAGNOSIS — C7951 Secondary malignant neoplasm of bone: Secondary | ICD-10-CM | POA: Insufficient documentation

## 2024-03-17 DIAGNOSIS — Z79890 Hormone replacement therapy (postmenopausal): Secondary | ICD-10-CM | POA: Diagnosis not present

## 2024-03-17 DIAGNOSIS — I700 Atherosclerosis of aorta: Secondary | ICD-10-CM | POA: Diagnosis not present

## 2024-03-17 DIAGNOSIS — N281 Cyst of kidney, acquired: Secondary | ICD-10-CM | POA: Diagnosis not present

## 2024-03-17 DIAGNOSIS — K760 Fatty (change of) liver, not elsewhere classified: Secondary | ICD-10-CM | POA: Diagnosis not present

## 2024-03-17 DIAGNOSIS — C089 Malignant neoplasm of major salivary gland, unspecified: Secondary | ICD-10-CM | POA: Insufficient documentation

## 2024-03-17 DIAGNOSIS — Z79899 Other long term (current) drug therapy: Secondary | ICD-10-CM | POA: Insufficient documentation

## 2024-03-17 DIAGNOSIS — K1230 Oral mucositis (ulcerative), unspecified: Secondary | ICD-10-CM | POA: Diagnosis not present

## 2024-03-17 DIAGNOSIS — Z79891 Long term (current) use of opiate analgesic: Secondary | ICD-10-CM | POA: Diagnosis not present

## 2024-03-17 DIAGNOSIS — Z923 Personal history of irradiation: Secondary | ICD-10-CM | POA: Insufficient documentation

## 2024-03-17 DIAGNOSIS — Z7952 Long term (current) use of systemic steroids: Secondary | ICD-10-CM | POA: Insufficient documentation

## 2024-03-17 MED ORDER — HYDROCODONE-ACETAMINOPHEN 7.5-325 MG/15ML PO SOLN: 10.0000 mL | Freq: Four times a day (QID) | ORAL | 0 refills | Status: AC | PRN

## 2024-03-17 NOTE — Progress Notes (Signed)
   Lance [name] is here today for follow up post radiation to the lumbar spine and cervical spine.  Patient completed treatment on 02/27/24.       Does the patient complain of any of the following: Pain: yes reports throat discomfort. Rates at  3/10.  Reports relief with hycet.  Shortness of breath w/wo exertion: No Pain with swallowing: Yes, reports throat feels raw.  Appetite: patient has appetite but unable to eat. Energy Level: Good       BP (!) 130/90   Pulse (P) 87   Temp (!) 96.9 F (36.1 C)   Resp 18   Ht 5' 8 (1.727 m)   Wt 161 lb (73 kg)   SpO2 100%   BMI 24.48 kg/m

## 2024-03-24 DIAGNOSIS — K625 Hemorrhage of anus and rectum: Secondary | ICD-10-CM | POA: Diagnosis not present

## 2024-03-24 DIAGNOSIS — K642 Third degree hemorrhoids: Secondary | ICD-10-CM | POA: Diagnosis not present

## 2024-03-27 ENCOUNTER — Telehealth: Payer: Self-pay | Admitting: *Deleted

## 2024-03-27 NOTE — Telephone Encounter (Signed)
 [facility] received that Lance [name] is calling about Lance [name] not receiving any information from provider. Advised jot yet received signed authorization  through [employer].   [facility] reports two ROI's in chart.  To begin on request submitted 10/24./2025.  Nothing received at [facility] after September.  Per HIM releases tab see [facility] received forms  October 24th and November 4th read faxed to [facility].  Record is on hold per [employer] with a due date of 04/03/2024.  Noted ROI, sent by this nurse was signed by Lance [name]  on 03/20/2024.  Completed  Lance [name] demographics.  No request for provider signature on letter.  This nurse answered questions of Treatment plan,, Next Appointment and, The Expected return to work date.  Faxed request marked URGENT to [facility] [phone]) and Lance [name] [phone]).  Third party name only was on the two ROI's makes them incomplete. Connected with Lance [name] [phone]).  Provided above information along with [facility] phone number [phone]) for status checks.  as he asked what is he to do next. this office for status checks.  Form staff may only view actions of records.  No further questions..  . Lance [name]

## 2024-03-31 ENCOUNTER — Ambulatory Visit: Admitting: Radiation Oncology

## 2024-03-31 NOTE — Progress Notes (Unsigned)
 Chief Complaint: Hemorrhoids  HPI:    Lance [name] is a 58 year old male, known to Dr. Avram, with a past medical history as listed below including salivary duct carcinoma with mets, anxiety and multiple others, who was referred to me by Larnell Hamilton, MD for a complaint of hemorrhoids.      02/10/15 colonoscopy with a sessile polyp removed from the rectum and otherwise normal.  Pathology showed hyperplastic polyp and repeat recommended in 2026.    02/17/2024 CTAP abdominal pain and stage IV cancer with segmental mural thickening involving distal jejunum and ileum compatible inflammatory infectious enteritis, stable mets to the thoracolumbar spine, persistent splenic hypodensities compatible with known mets and hepatic steatosis.    03/04/2024 office visit with oncology.  At that time currently on radiation therapy and chemotherapy.    03/12/2024 CBC normal, CMP with sodium of 132, glucose 120, BUN 25, ALT 79 otherwise normal.    03/25/24 patient saw PCP for grade 3 hemorrhoids.  Given hydrocortisone 2.5% cream to apply 3 times daily x 14 days.    Today, patient tells me he has had some trouble off-and-on this year with hemorrhoids, every time they come back it seems like they have gotten worse.  Most recently had been applying Hydrocortisone over the past week and feels like it has greatly helped with the discomfort and bleeding that he was having from hemorrhoids seen by his PCP.  [name] also getting better at managing his constipation.    Denies fever, chills or weight loss.  Past Medical History:  Diagnosis Date   Anxiety    Graves disease    dx'd 01/2016   History of kidney stones    History of radiation therapy    Lumbar Spine, Cervical spine- 01/29/24-02/27/24- Dr. Lynwood Nasuti   Hyperthyroidism    Hypothyroid    Leukopenia    Mediastinal lymphadenopathy    Neutropenia    Neutropenic fever    Pneumonia ~ 2015 X 1   Salivary duct carcinoma (HCC) 01/14/2024    Past Surgical  History:  Procedure Laterality Date   CLAVICLE SURGERY Left 1980s   has plates and pins   ENDOBRONCHIAL ULTRASOUND Bilateral 12/18/2023   Procedure: ENDOBRONCHIAL ULTRASOUND (EBUS);  Surgeon: Isadora Hose, MD;  Location: [facility] ORS;  Service: Pulmonary;  Laterality: Bilateral;   FRACTURE SURGERY     I & D EXTREMITY Right 11/04/2016   Procedure: IRRIGATION AND DEBRIDEMENT INDEX FINGER AND PINNING;  Surgeon: Shari Easter, MD;  Location: [facility] OR;  Service: Orthopedics;  Laterality: Right;   KYPHOPLASTY N/A 12/12/2023   Procedure: LUMBAR FIVE KYPHOPLASTY WITH LUMBAR FIVE LESION ABLASION;  Surgeon: Beuford Anes, MD;  Location: [facility] OR;  Service: Orthopedics;  Laterality: N/A;   LITHOTRIPSY  1990s   MEDIASTINOSCOPY N/A 01/03/2024   Procedure: MEDIASTINOSCOPY;  Surgeon: Kerrin Elspeth BROCKS, MD;  Location: [facility];  Service: Thoracic;  Laterality: N/A;    Current Outpatient Medications  Medication Sig Dispense Refill   ALPRAZolam  (XANAX ) 0.25 MG tablet Take 0.25 mg by mouth at bedtime as needed for sleep.     ALPRAZolam  (XANAX ) 0.5 MG tablet Take 1 tablet (0.5 mg total) by mouth 2 (two) times daily as needed for anxiety. 30 tablet 0   amLODipine  (NORVASC ) 5 MG tablet Take 1 tablet (5 mg total) by mouth daily. (Patient not taking: Reported on 03/04/2024) 30 tablet 6   bicalutamide  (CASODEX ) 50 MG tablet Take 1 tablet (50 mg total) by mouth daily. (Patient not taking: Reported on 03/04/2024) 30  tablet 12   dexamethasone  (DECADRON ) 4 MG tablet Take 5 tablets (20 mg total) by mouth daily. You must take this with food.  Preferably, take this in the morning with breakfast. 120 tablet 2   fluconazole (DIFLUCAN) 100 MG tablet Take 1 tablet (100 mg total) by mouth daily. 7 tablet 0   levothyroxine (SYNTHROID) 100 MCG tablet Take 100 mcg by mouth daily.     lidocaine  (XYLOCAINE ) 2 % solution Use as directed 15 mLs in the mouth or throat every 4 (four) hours as needed for mouth pain. 500 mL 4   ondansetron   (ZOFRAN ) 4 MG tablet Take 1 tablet (4 mg total) by mouth every 8 (eight) hours as needed for nausea or vomiting. 20 tablet 0   pantoprazole  (PROTONIX ) 40 MG tablet Take 1 tablet (40 mg total) by mouth 2 (two) times daily. 60 tablet 5   sucralfate (CARAFATE) 1 g tablet Take 1 tablet (1 g total) by mouth 4 (four) times daily -  with meals and at bedtime. Crush and dissolve in 10 mL of warm water prior to swallowing, take 20 to 30 minutes prior to meals 120 tablet 1   traMADol  (ULTRAM ) 50 MG tablet Take 50 mg by mouth 2 (two) times daily.     zolpidem  (AMBIEN ) 10 MG tablet Take 10 mg by mouth at bedtime.     No current facility-administered medications for this visit.    Allergies as of 04/01/2024 - Review Complete 03/17/2024  Allergen Reaction Noted   Methimazole  Other (See Comments) 11/04/2016   Methocarbamol Other (See Comments) 01/19/2022    Family History  Problem Relation Age of Onset   Cancer Mother        pancreas   Sleep apnea Mother    Cancer Father        bladder   Bladder Cancer Father    Hypertension Father    Colon cancer Neg Hx    Esophageal cancer Neg Hx    Prostate cancer Neg Hx    Rectal cancer Neg Hx    Stomach cancer Neg Hx    Thyroid  disease Neg Hx    Migraines Neg Hx    Headache Neg Hx     Social History   Socioeconomic History   Marital status: Married    Spouse name: Not on file   Number of children: Not on file   Years of education: Not on file   Highest education level: Not on file  Occupational History   Not on file  Tobacco Use   Smoking status: Former    Current packs/day: 0.00    Average packs/day: 0.5 packs/day for 20.0 years (10.0 ttl pk-yrs)    Types: Cigarettes    Start date: 09/20/1994    Quit date: 09/20/2014    Years since quitting: 9.5    Passive exposure: Never   Smokeless tobacco: Former    Types: Chew   Tobacco comments:    stopped chewing the 1990s. Quit smoking cigarettes in 2015.  Vaping Use   Vaping status: Never Used   Substance and Sexual Activity   Alcohol  use: No    Alcohol /week: 0.0 standard drinks of alcohol    Drug use: No   Sexual activity: Yes  Other Topics Concern   Not on file  Social History Narrative   Not on file   Social Drivers of Health   Financial Resource Strain: Not on file  Food Insecurity: No Food Insecurity (01/23/2024)   Hunger Vital Sign  Worried About Programme Researcher, Broadcasting/film/video in the Last Year: Never true    Ran Out of Food in the Last Year: Never true  Transportation Needs: No Transportation Needs (01/23/2024)   PRAPARE - Administrator, Civil Service (Medical): No    Lack of Transportation (Non-Medical): No  Physical Activity: Not on file  Stress: Not on file  Social Connections: Not on file  Intimate Partner Violence: Not At Risk (01/23/2024)   Humiliation, Afraid, Rape, and Kick questionnaire    Fear of Current or Ex-Partner: No    Emotionally Abused: No    Physically Abused: No    Sexually Abused: No    Review of Systems:    Constitutional: No weight loss, fever or chills Skin: No rash  Cardiovascular: No chest pain Respiratory: No SOB Gastrointestinal: See HPI and otherwise negative Genitourinary: No dysuria  Neurological: No headache, dizziness or syncope Musculoskeletal: No new muscle or joint pain Hematologic: No  bruising Psychiatric: No history of depression or anxiety   Physical Exam:  Vital signs: BP 126/78   Pulse 79   Ht 5' 8 (1.727 m)   Wt 160 lb 6.4 oz (72.8 kg)   BMI 24.39 kg/m    Constitutional:   Pleasant Caucasian male appears to be in NAD, Well developed, Well nourished, alert and cooperative Head:  Normocephalic and atraumatic. Eyes:   PEERL, EOMI. No icterus. Conjunctiva pink. Ears:  Normal auditory acuity. Neck:  Supple Throat: Oral cavity and pharynx without inflammation, swelling or lesion.  Respiratory: Respirations even and unlabored. Lungs clear to auscultation bilaterally.   No wheezes, crackles, or rhonchi.   Cardiovascular: Normal S1, S2. No MRG. Regular rate and rhythm. No peripheral edema, cyanosis or pallor.  Gastrointestinal:  Soft, nondistended, nontender. No rebound or guarding. Normal bowel sounds. No appreciable masses or hepatomegaly. Rectal: External: Clogged pores near rectum; internal: No mass or tenderness; anoscopy: Grade 1 hemorrhoid posteriorly Msk:  Symmetrical without gross deformities. Without edema, no deformity or joint abnormality.  Neurologic:  Alert and  oriented x4;  grossly normal neurologically.  Skin:   Dry and intact without significant lesions or rashes. Psychiatric: Demonstrates good judgement and reason without abnormal affect or behaviors.  RELEVANT LABS AND IMAGING: CBC    Component Value Date/Time   WBC 8.1 03/12/2024 1132   WBC 6.5 02/17/2024 2015   RBC 4.79 03/12/2024 1132   HGB 15.0 03/12/2024 1132   HCT 41.8 03/12/2024 1132   PLT 223 03/12/2024 1132   MCV 87.3 03/12/2024 1132   MCH 31.3 03/12/2024 1132   MCHC 35.9 03/12/2024 1132   RDW 15.5 03/12/2024 1132   LYMPHSABS 0.3 (L) 03/12/2024 1132   MONOABS 0.4 03/12/2024 1132   EOSABS 0.0 03/12/2024 1132   BASOSABS 0.0 03/12/2024 1132    CMP     Component Value Date/Time   NA 132 (L) 03/12/2024 1132   K 4.2 03/12/2024 1132   CL 101 03/12/2024 1132   CO2 24 03/12/2024 1132   GLUCOSE 120 (H) 03/12/2024 1132   BUN 25 (H) 03/12/2024 1132   CREATININE 0.82 03/12/2024 1132   CALCIUM 9.1 03/12/2024 1132   PROT 6.1 (L) 03/12/2024 1132   ALBUMIN 3.9 03/12/2024 1132   AST 25 03/12/2024 1132   ALT 79 (H) 03/12/2024 1132   ALKPHOS 58 03/12/2024 1132   BILITOT 0.9 03/12/2024 1132   GFRNONAA >60 03/12/2024 1132   GFRAA >60 04/01/2016 0515    Assessment: 1.  Hemorrhoids: On exam today much improved  from what the patient and his PCP described just a week ago after a weeks worth of Hydrocortisone 3 times daily, no further discomfort per the patient 2.  Metastatic salivary duct carcinoma: On chemo and  radiation  Plan: 1.  At this point hemorrhoids are much better.  No longer painful and only grade 1 on exam today and 1 column.  Discussed the possibility of banding in the future though I do not think he needs it at this moment.  Possibly after next colonoscopy Dr. Avram could evaluate him then. 2.  At this point would recommend hydrocortisone suppository twice daily for the next 2 to 3 days to fully take care of this hemorrhoid.  He can keep the rest for use as needed 3.  Manage constipation with fiber, water and exercise.  MiraLAX if needed. 4.  Patient to follow-up in clinic as needed or next year for his screening colonoscopy.  Lance Failing, PA-C [facility] 03/31/2024, 9:44 AM  Cc: Larnell Hamilton, MD

## 2024-04-01 ENCOUNTER — Ambulatory Visit: Admitting: Physician Assistant

## 2024-04-01 ENCOUNTER — Encounter: Payer: Self-pay | Admitting: Physician Assistant

## 2024-04-01 VITALS — BP 126/78 | HR 79 | Ht 68.0 in | Wt 160.4 lb

## 2024-04-01 DIAGNOSIS — C089 Malignant neoplasm of major salivary gland, unspecified: Secondary | ICD-10-CM

## 2024-04-01 DIAGNOSIS — K640 First degree hemorrhoids: Secondary | ICD-10-CM | POA: Diagnosis not present

## 2024-04-01 MED ORDER — HYDROCORTISONE ACETATE 25 MG RE SUPP: 25.0000 mg | Freq: Two times a day (BID) | RECTAL | 1 refills | Status: AC

## 2024-04-01 NOTE — Patient Instructions (Signed)
 We have sent the following medications to your pharmacy for you to pick up at your convenience: Hydrocortisone suppository twice daily for 2-3 days, then repeat as needed.   _______________________________________________________  If your blood pressure at your visit was 140/90 or greater, please contact your primary care physician to follow up on this.  _______________________________________________________  If you are age 59 or older, your body mass index should be between 23-30. Your Body mass index is 24.39 kg/m. If this is out of the aforementioned range listed, please consider follow up with your Primary Care Provider.  If you are age 89 or younger, your body mass index should be between 19-25. Your Body mass index is 24.39 kg/m. If this is out of the aformentioned range listed, please consider follow up with your Primary Care Provider.   ________________________________________________________  The [facility] providers would like to encourage you to use MYCHART to communicate with providers for non-urgent requests or questions.  Due to long hold times on the telephone, sending your provider a message by [facility] may be a faster and more efficient way to get a response.  Please allow 48 business hours for a response.  Please remember that this is for non-urgent requests.  _______________________________________________________  [facility] is using a team-based approach to care.  Your team is made up of your doctor and two to three APPS. Our APPS (Nurse Practitioners and Physician Assistants) work with your physician to ensure care continuity for you. They are fully qualified to address your health concerns and develop a treatment plan. They communicate directly with your gastroenterologist to care for you. Seeing the Advanced Practice Practitioners on your physician's team can help you by facilitating care more promptly, often allowing for earlier appointments, access to  diagnostic testing, procedures, and other specialty referrals.

## 2024-04-02 ENCOUNTER — Encounter: Payer: Self-pay | Admitting: Hematology & Oncology

## 2024-04-02 ENCOUNTER — Other Ambulatory Visit (HOSPITAL_COMMUNITY): Payer: Self-pay

## 2024-04-04 ENCOUNTER — Encounter (HOSPITAL_COMMUNITY)
Admission: RE | Admit: 2024-04-04 | Discharge: 2024-04-04 | Disposition: A | Discharge status: Home / Self Care | Source: Ambulatory Visit | Attending: Hematology & Oncology | Admitting: Hematology & Oncology

## 2024-04-04 DIAGNOSIS — R590 Localized enlarged lymph nodes: Secondary | ICD-10-CM | POA: Diagnosis not present

## 2024-04-04 DIAGNOSIS — I700 Atherosclerosis of aorta: Secondary | ICD-10-CM | POA: Diagnosis not present

## 2024-04-04 DIAGNOSIS — C7951 Secondary malignant neoplasm of bone: Secondary | ICD-10-CM | POA: Diagnosis not present

## 2024-04-04 DIAGNOSIS — C089 Malignant neoplasm of major salivary gland, unspecified: Secondary | ICD-10-CM | POA: Diagnosis not present

## 2024-04-04 LAB — GLUCOSE, CAPILLARY: Glucose-Capillary: 90 mg/dL (ref 70–99)

## 2024-04-04 MED ORDER — FLUDEOXYGLUCOSE F - 18 (FDG) INJECTION
7.9900 | Freq: Once | INTRAVENOUS | Status: AC
  Administered 2024-04-04: 7.99 via INTRAVENOUS

## 2024-04-07 ENCOUNTER — Ambulatory Visit: Payer: Self-pay | Admitting: Hematology & Oncology

## 2024-04-07 ENCOUNTER — Encounter: Payer: Self-pay | Admitting: *Deleted

## 2024-04-07 NOTE — Progress Notes (Signed)
 Reviewed PET which shows excellent treatment response.   Oncology Nurse Navigator Documentation     04/07/2024    7:45 AM  Oncology Nurse Navigator Flowsheets  Navigator Follow Up Date: 04/09/2024  Navigator Follow Up Reason: Follow-up Appointment  Navigator Location [facility]  Navigator Encounter Type Scan Review  Patient Visit Type MedOnc  Treatment Phase Active Tx  Barriers/Navigation Needs No Barriers At This Time  Interventions None Required  Acuity Level 1-No Barriers  Support Groups/Services Friends and Family  Time Spent with Patient 15

## 2024-04-09 ENCOUNTER — Other Ambulatory Visit: Payer: Self-pay | Admitting: *Deleted

## 2024-04-09 ENCOUNTER — Inpatient Hospital Stay (HOSPITAL_BASED_OUTPATIENT_CLINIC_OR_DEPARTMENT_OTHER): Admitting: Hematology & Oncology

## 2024-04-09 ENCOUNTER — Encounter: Payer: Self-pay | Admitting: Hematology & Oncology

## 2024-04-09 ENCOUNTER — Inpatient Hospital Stay: Attending: Hematology & Oncology

## 2024-04-09 ENCOUNTER — Inpatient Hospital Stay

## 2024-04-09 VITALS — BP 144/82 | HR 83 | Temp 97.9°F | Resp 18 | Ht 68.0 in | Wt 160.0 lb

## 2024-04-09 DIAGNOSIS — C799 Secondary malignant neoplasm of unspecified site: Secondary | ICD-10-CM | POA: Diagnosis not present

## 2024-04-09 DIAGNOSIS — R590 Localized enlarged lymph nodes: Secondary | ICD-10-CM

## 2024-04-09 DIAGNOSIS — C089 Malignant neoplasm of major salivary gland, unspecified: Secondary | ICD-10-CM | POA: Insufficient documentation

## 2024-04-09 DIAGNOSIS — Z79899 Other long term (current) drug therapy: Secondary | ICD-10-CM | POA: Diagnosis not present

## 2024-04-09 LAB — CBC WITH DIFFERENTIAL (CANCER CENTER ONLY)
Abs Immature Granulocytes: 0.08 K/uL — ABNORMAL HIGH (ref 0.00–0.07)
Basophils Absolute: 0 K/uL (ref 0.0–0.1)
Basophils Relative: 0 %
Eosinophils Absolute: 0 K/uL (ref 0.0–0.5)
Eosinophils Relative: 0 %
HCT: 39.8 % (ref 39.0–52.0)
Hemoglobin: 13.8 g/dL (ref 13.0–17.0)
Immature Granulocytes: 2 %
Lymphocytes Relative: 5 %
Lymphs Abs: 0.3 K/uL — ABNORMAL LOW (ref 0.7–4.0)
MCH: 31.2 pg (ref 26.0–34.0)
MCHC: 34.7 g/dL (ref 30.0–36.0)
MCV: 90 fL (ref 80.0–100.0)
Monocytes Absolute: 0.3 K/uL (ref 0.1–1.0)
Monocytes Relative: 5 %
Neutro Abs: 4.8 K/uL (ref 1.7–7.7)
Neutrophils Relative %: 88 %
Platelet Count: 217 K/uL (ref 150–400)
RBC: 4.42 MIL/uL (ref 4.22–5.81)
RDW: 15 % (ref 11.5–15.5)
WBC Count: 5.5 K/uL (ref 4.0–10.5)
nRBC: 0 % (ref 0.0–0.2)

## 2024-04-09 LAB — COMPREHENSIVE METABOLIC PANEL WITH GFR
ALT: 60 U/L — ABNORMAL HIGH (ref 0–44)
AST: 21 U/L (ref 15–41)
Albumin: 4.1 g/dL (ref 3.5–5.0)
Alkaline Phosphatase: 42 U/L (ref 38–126)
Anion gap: 11 (ref 5–15)
BUN: 16 mg/dL (ref 6–20)
CO2: 23 mmol/L (ref 22–32)
Calcium: 9.1 mg/dL (ref 8.9–10.3)
Chloride: 103 mmol/L (ref 98–111)
Creatinine, Ser: 0.77 mg/dL (ref 0.61–1.24)
GFR, Estimated: >60 mL/min (ref >60)
Glucose, Bld: 127 mg/dL — ABNORMAL HIGH (ref 70–99)
Potassium: 4.8 mmol/L (ref 3.5–5.1)
Sodium: 137 mmol/L (ref 135–145)
Total Bilirubin: 0.9 mg/dL (ref 0.0–1.2)
Total Protein: 6.2 g/dL — ABNORMAL LOW (ref 6.5–8.1)

## 2024-04-09 LAB — LACTATE DEHYDROGENASE: LDH: 282 U/L — ABNORMAL HIGH (ref 105–235)

## 2024-04-09 MED ORDER — LEUPROLIDE ACETATE (3 MONTH) 22.5 MG ~~LOC~~ KIT
22.5000 mg | PACK | Freq: Once | SUBCUTANEOUS | Status: AC
  Administered 2024-04-09: 22.5 mg via SUBCUTANEOUS
  Filled 2024-04-09: qty 22.5

## 2024-04-09 MED ORDER — DENOSUMAB 120 MG/1.7ML ~~LOC~~ SOLN
120.0000 mg | Freq: Once | SUBCUTANEOUS | Status: AC
  Administered 2024-04-09: 120 mg via SUBCUTANEOUS
  Filled 2024-04-09: qty 1.7

## 2024-04-09 NOTE — Progress Notes (Signed)
 Hematology and Oncology Follow Up Visit  Lance [name] [id number] Apr 04, 1966 58 y.o. 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  He really looks fantastic.  He feels good.  The radiation esophagitis finally improved.  He really had a tough time with this.  What is absolutely amazing is that his PET scan showed that he has had a fantastic response.  The PET scan was done on 04/04/2024.  Most of his cancer had resolved.  He had resolution of hypermetabolic mediastinal adenopathy.  He had resolution of splenic lesions.  He had near resolution of his osseous metastatic disease.  There is still a little bit of activity at L5 but this is minor.  I am just so happy that he has responded.  His quality of life is doing so much better.  [name] going go back to work in December.  He does have a hot flashes.  We have made him castrate level because of the Lupron .  Is doing well on the Casodex .  Again I am just happy that he has responded.  Overall, I would say that his performance status is probably ECOG 0.     Medications:  Current Outpatient Medications:    ALPRAZolam  (XANAX ) 0.25 MG tablet, Take 0.25 mg by mouth at bedtime as needed for sleep., Disp: , Rfl:    ALPRAZolam  (XANAX ) 0.5 MG tablet, Take 1 tablet (0.5 mg total) by mouth 2 (two) times daily as needed for anxiety., Disp: 30 tablet, Rfl: 0   bicalutamide  (CASODEX ) 50 MG tablet, Take 1 tablet (50 mg total) by mouth daily., Disp: 30 tablet, Rfl: 12   dexamethasone  (DECADRON ) 4 MG tablet, Take 5 tablets (20 mg total) by mouth daily. You must take this with food.   Preferably, take this in the morning with breakfast., Disp: 120 tablet, Rfl: 2   fluconazole (DIFLUCAN) 100 MG tablet, Take 1 tablet (100 mg total) by mouth daily., Disp: 7 tablet, Rfl: 0   hydrocortisone (ANUSOL-HC) 25 MG suppository, Place 1 suppository (25 mg total) rectally every 12 (twelve) hours., Disp: 14 suppository, Rfl: 1   levothyroxine (SYNTHROID) 100 MCG tablet, Take 100 mcg by mouth daily., Disp: , Rfl:    lidocaine  (XYLOCAINE ) 2 % solution, Use as directed 15 mLs in the mouth or throat every 4 (four) hours as needed for mouth pain., Disp: 500 mL, Rfl: 4   ondansetron  (ZOFRAN ) 4 MG tablet, Take 1 tablet (4 mg total) by mouth every 8 (eight) hours as needed for nausea or vomiting., Disp: 20 tablet, Rfl: 0   pantoprazole  (PROTONIX ) 40 MG tablet, Take 1 tablet (40 mg total) by mouth 2 (two) times daily., Disp: 60 tablet, Rfl: 5   sucralfate (CARAFATE) 1 g tablet, Take 1 tablet (1 g total) by mouth 4 (four) times daily -  with meals and at bedtime. Crush and dissolve in 10 mL of warm water prior to swallowing, take 20 to 30 minutes prior to meals, Disp: 120 tablet, Rfl: 1   traMADol  (ULTRAM ) 50 MG tablet, Take 50 mg by mouth 2 (two) times daily., Disp: ,  Rfl:    zolpidem  (AMBIEN ) 10 MG tablet, Take 10 mg by mouth at bedtime., Disp: , Rfl:    amLODipine  (NORVASC ) 5 MG tablet, Take 1 tablet (5 mg total) by mouth daily. (Patient not taking: Reported on 04/09/2024), Disp: 30 tablet, Rfl: 6  Allergies:  Allergies  Allergen Reactions   Methimazole  Other (See Comments)    WBC drops   Methocarbamol Other (See Comments)    WBC drops    Past Medical History, Surgical history, Social history, and Family History were reviewed and updated.  Review of Systems: Review of Systems  HENT:  Negative.    Eyes: Negative.   Cardiovascular: Negative.   Endocrine: Negative.   Genitourinary: Negative.    Musculoskeletal:  Positive for myalgias.  Skin: Negative.   Neurological: Negative.    Hematological: Negative.   Psychiatric/Behavioral: Negative.      Physical Exam:  height is 5' 8 (1.727 m) and weight is 160 lb 0.2 oz (72.6 kg). His oral temperature is 97.9 F (36.6 C). His blood pressure is 144/82 (abnormal) and his pulse is 83. His respiration is 18 and oxygen saturation is 100%.   Wt Readings from Last 3 Encounters:  04/09/24 160 lb 0.2 oz (72.6 kg)  04/01/24 160 lb 6.4 oz (72.8 kg)  03/17/24 161 lb (73 kg)    Physical Exam Vitals reviewed.  HENT:     Head: Normocephalic and atraumatic.  Eyes:     Pupils: Pupils are equal, round, and reactive to light.  Cardiovascular:     Rate and Rhythm: Normal rate and regular rhythm.     Heart sounds: Normal heart sounds.  Pulmonary:     Effort: Pulmonary effort is normal.     Breath sounds: Normal breath sounds.  Abdominal:     General: Bowel sounds are normal.     Palpations: Abdomen is soft.  Musculoskeletal:        General: No tenderness or deformity. Normal range of motion.     Cervical back: Normal range of motion.  Lymphadenopathy:     Cervical: No cervical adenopathy.  Skin:    General: Skin is warm and dry.     Findings: No erythema or rash.  Neurological:     Mental Status: [name] alert and oriented to person, place, and time.  Psychiatric:        Behavior: Behavior normal.        Thought Content: Thought content normal.        Judgment: Judgment normal.      Lab Results  Component Value Date   WBC 5.5 04/09/2024   HGB 13.8 04/09/2024   HCT 39.8 04/09/2024   MCV 90.0 04/09/2024   PLT 217 04/09/2024     Chemistry      Component Value Date/Time   NA 137 04/09/2024 1503   K 4.8 04/09/2024 1503   CL 103 04/09/2024 1503   CO2 23 04/09/2024 1503   BUN 16 04/09/2024 1503   CREATININE 0.77 04/09/2024 1503   CREATININE 0.82 03/12/2024 1132      Component Value Date/Time   CALCIUM 9.1 04/09/2024 1503   ALKPHOS 42 04/09/2024 1503   AST 21 04/09/2024 1503   AST 25 03/12/2024 1132   ALT  60 (H) 04/09/2024 1503   ALT 79 (H) 03/12/2024 1132   BILITOT 0.9 04/09/2024 1503   BILITOT 0.9 03/12/2024 1132      Impression and Plan: Lance [name] is a very nice 58 year old white male.  He comes  in with his wife.  He has salivary gland adenocarcinoma.  I must say that this is a very unusual cancer.  I know our pathologists are very thorough with their immunohistochemical studies.  Again, this tumor is strongly positive for androgen receptor.  So, we are treating him with antiandrogen therapy.  Thankfully, treatment has worked.  His PET scan looks fantastic.  He has a very nice response.  I told him that he has had a very good partial remission.  Hopefully, we will continue to see and respond.  If so, the maybe we can move our treatments out a little bit longer.  Think we now get him through the Holiday season.  We will then plan to get him back next year.  Again I am so happy that his quality of life has come back to where he likes it to be.     Maude JONELLE Crease, MD 11/19/20254:36 PM

## 2024-04-09 NOTE — Patient Instructions (Signed)
 Denosumab Injection (Oncology) What is this medication? DENOSUMAB (den oh SUE mab) prevents weakened bones caused by cancer. It may also be used to treat noncancerous bone tumors that cannot be removed by surgery. It can also be used to treat high calcium levels in the blood caused by cancer. It works by blocking a protein that causes bones to break down quickly. This slows down the release of calcium from bones, which lowers calcium levels in your blood. It also makes your bones stronger and less likely to break (fracture). This medicine may be used for other purposes; ask your health care provider or pharmacist if you have questions. COMMON BRAND NAME(S): XGEVA What should I tell my care team before I take this medication? They need to know if you have any of these conditions: Dental disease Having surgery or tooth extraction Infection Kidney disease Low levels of calcium or vitamin D in the blood Malnutrition On hemodialysis Skin conditions or sensitivity Thyroid or parathyroid disease An unusual reaction to denosumab, other medications, foods, dyes, or preservatives Pregnant or trying to get pregnant Breast-feeding How should I use this medication? This medication is for injection under the skin. It is given by your care team in a hospital or clinic setting. A special MedGuide will be given to you before each treatment. Be sure to read this information carefully each time. Talk to your care team about the use of this medication in children. While it may be prescribed for children as young as 13 years for selected conditions, precautions do apply. Overdosage: If you think you have taken too much of this medicine contact a poison control center or emergency room at once. NOTE: This medicine is only for you. Do not share this medicine with others. What if I miss a dose? Keep appointments for follow-up doses. It is important not to miss your dose. Call your care team if you are unable to  keep an appointment. What may interact with this medication? Do not take this medication with any of the following: Other medications containing denosumab This medication may also interact with the following: Medications that lower your chance of fighting infection Steroid medications, such as prednisone or cortisone This list may not describe all possible interactions. Give your health care provider a list of all the medicines, herbs, non-prescription drugs, or dietary supplements you use. Also tell them if you smoke, drink alcohol, or use illegal drugs. Some items may interact with your medicine. What should I watch for while using this medication? Your condition will be monitored carefully while you are receiving this medication. You may need blood work while taking this medication. This medication may increase your risk of getting an infection. Call your care team for advice if you get a fever, chills, sore throat, or other symptoms of a cold or flu. Do not treat yourself. Try to avoid being around people who are sick. You should make sure you get enough calcium and vitamin D while you are taking this medication, unless your care team tells you not to. Discuss the foods you eat and the vitamins you take with your care team. Some people who take this medication have severe bone, joint, or muscle pain. This medication may also increase your risk for jaw problems or a broken thigh bone. Tell your care team right away if you have severe pain in your jaw, bones, joints, or muscles. Tell your care team if you have any pain that does not go away or that gets worse. Talk  to your care team if you may be pregnant. Serious birth defects can occur if you take this medication during pregnancy and for 5 months after the last dose. You will need a negative pregnancy test before starting this medication. Contraception is recommended while taking this medication and for 5 months after the last dose. Your care team  can help you find the option that works for you. What side effects may I notice from receiving this medication? Side effects that you should report to your care team as soon as possible: Allergic reactions--skin rash, itching, hives, swelling of the face, lips, tongue, or throat Bone, joint, or muscle pain Low calcium level--muscle pain or cramps, confusion, tingling, or numbness in the hands or feet Osteonecrosis of the jaw--pain, swelling, or redness in the mouth, numbness of the jaw, poor healing after dental work, unusual discharge from the mouth, visible bones in the mouth Side effects that usually do not require medical attention (report to your care team if they continue or are bothersome): Cough Diarrhea Fatigue Headache Nausea This list may not describe all possible side effects. Call your doctor for medical advice about side effects. You may report side effects to FDA at 1-800-FDA-1088. Where should I keep my medication? This medication is given in a hospital or clinic. It will not be stored at home. NOTE: This sheet is a summary. It may not cover all possible information. If you have questions about this medicine, talk to your doctor, pharmacist, or health care provider.  2024 Elsevier/Gold Standard (2021-09-28 00:00:00) Leuprolide Solution for Injection What is this medication? LEUPROLIDE (loo PROE lide) reduces the symptoms of prostate cancer. It works by decreasing levels of the hormone testosterone in the body. This prevents prostate cancer cells from spreading or growing. This medicine may be used for other purposes; ask your health care provider or pharmacist if you have questions. COMMON BRAND NAME(S): Lupron What should I tell my care team before I take this medication? They need to know if you have any of these conditions: Diabetes Heart attack Heart disease High blood pressure High cholesterol Pain or difficulty passing urine Spinal cord  metastasis Stroke Tobacco use An unusual or allergic reaction to leuprolide, other medications, foods, dyes, or preservatives Pregnant or trying to get pregnant Breast-feeding How should I use this medication? This medication is for injection under the skin or into a muscle. You will be taught how to prepare and give this medication. Use exactly as directed. Take your medication at regular intervals. Do not take it more often than directed. It is important that you put your used needles and syringes in a special sharps container. Do not put them in a trash can. If you do not have a sharps container, call your care team to get one. A special MedGuide will be given to you by the pharmacist with each prescription and refill. Be sure to read this information carefully each time. Talk to your care team about the use of this medication in children. While this medication may be prescribed for children as young as 8 years for selected conditions, precautions do apply. Overdosage: If you think you have taken too much of this medicine contact a poison control center or emergency room at once. NOTE: This medicine is only for you. Do not share this medicine with others. What if I miss a dose? If you miss a dose, take it as soon as you can. If it is almost time for your next dose, take only that  dose. Do not take double or extra doses. What may interact with this medication? Do not take this medication with any of the following: Chasteberry Cisapride Dronedarone Pimozide Thioridazine This medication may also interact with the following: Estrogen or progestin hormones Herbal or dietary supplements, like black cohosh or DHEA Other medications that cause heart rhythm changes Testosterone This list may not describe all possible interactions. Give your health care provider a list of all the medicines, herbs, non-prescription drugs, or dietary supplements you use. Also tell them if you smoke, drink alcohol,  or use illegal drugs. Some items may interact with your medicine. What should I watch for while using this medication? Visit your care team for regular checks on your progress. During the first week, your symptoms may get worse, but then will improve as you continue your treatment. You may get hot flashes, increased bone pain, increased difficulty passing urine, or an aggravation of nerve symptoms. Discuss these effects with your care team, some of them may improve with continued use of this medication. Patients may experience a menstrual cycle or spotting during the first 2 months of therapy with this medication. If this continues, contact your care team. This medication may increase blood sugar. The risk may be higher in patients who already have diabetes. Ask your care team what you can do to lower your risk of diabetes while taking this medication. What side effects may I notice from receiving this medication? Side effects that you should report to your care team as soon as possible: Allergic reactions--skin rash, itching, hives, swelling of the face, lips, tongue, or throat Heart attack--pain or tightness in the chest, shoulders, arms, or jaw, nausea, shortness of breath, cold or clammy skin, feeling faint or lightheaded Heart rhythm changes--fast or irregular heartbeat, dizziness, feeling faint or lightheaded, chest pain, trouble breathing High blood sugar (hyperglycemia)--increased thirst or amount of urine, unusual weakness or fatigue, blurry vision New or worsening seizures Redness, blistering, peeling, or loosening of the skin, including inside the mouth Stroke--sudden numbness or weakness of the face, arm, or leg, trouble speaking, confusion, trouble walking, loss of balance or coordination, dizziness, severe headache, change in vision Swelling and pain of the tumor site or lymph nodes Side effects that usually do not require medical attention (report these to your care team if they  continue or are bothersome): Change in sex drive or performance Hot flashes Joint pain Pain, redness, or irritation at injection site Swelling of the ankles, hands, or feet Unusual weakness or fatigue This list may not describe all possible side effects. Call your doctor for medical advice about side effects. You may report side effects to FDA at 1-800-FDA-1088. Where should I keep my medication? Keep out of the reach of children and pets. Store below 25 degrees C (77 degrees F). Do not freeze. Protect from light. Get rid of any unused medication after the expiration date. To get rid of medications that are no longer needed or have expired: Take the medication to a medication take-back program. Check with your pharmacy or law enforcement to find a location. If you cannot return the medication, ask your pharmacist or care team how to get rid of this medication safely. NOTE: This sheet is a summary. It may not cover all possible information. If you have questions about this medicine, talk to your doctor, pharmacist, or health care provider.  2024 Elsevier/Gold Standard (2023-04-20 00:00:00)

## 2024-04-10 ENCOUNTER — Encounter: Payer: Self-pay | Admitting: *Deleted

## 2024-04-10 LAB — TESTOSTERONE: Testosterone: <3 ng/dL — ABNORMAL LOW (ref 264–916)

## 2024-04-10 NOTE — Progress Notes (Signed)
 Patient was seen yesterday. His recent PET scan showed good treatment response. His QOL has improved and he is returning to work. Continue with Lupron  q3months.   Oncology Nurse Navigator Documentation     04/10/2024    9:30 AM  Oncology Nurse Navigator Flowsheets  Navigator Follow Up Date: 07/10/2024  Navigator Follow Up Reason: Follow-up Appointment  Navigator Location [facility]  Navigator Encounter Type Appt/Treatment Plan Review  Patient Visit Type MedOnc  Treatment Phase Active Tx  Barriers/Navigation Needs No Barriers At This Time  Interventions None Required  Acuity Level 1-No Barriers  Support Groups/Services Friends and Family  Time Spent with Patient 15

## 2024-04-14 ENCOUNTER — Encounter: Payer: Self-pay | Admitting: *Deleted

## 2024-04-23 ENCOUNTER — Encounter: Payer: Self-pay | Admitting: Radiation Oncology

## 2024-04-23 ENCOUNTER — Ambulatory Visit
Admission: RE | Admit: 2024-04-23 | Discharge: 2024-04-23 | Disposition: A | Source: Ambulatory Visit | Attending: Radiation Oncology | Admitting: Radiation Oncology

## 2024-04-23 VITALS — BP 146/102 | HR 88 | Temp 97.1°F | Resp 18 | Ht 68.0 in | Wt 166.4 lb

## 2024-04-23 DIAGNOSIS — C089 Malignant neoplasm of major salivary gland, unspecified: Secondary | ICD-10-CM

## 2024-04-23 DIAGNOSIS — R590 Localized enlarged lymph nodes: Secondary | ICD-10-CM

## 2024-04-23 NOTE — Progress Notes (Addendum)
      Lance [name] is here today for follow up post radiation to the lumbar spine and cervical spine.  Patient completed treatment on 02/27/24.       Does the patient complain of any of the following: Pain: Yes reports having sharp, shooting pains to back of head. Rates pain at 8/10 Shortness of breath:No Swallowing: Improved  Appetite: good  Patient is currently down to taking Dexamethasone  10mg  daily.      BP (!) 146/102 (BP Location: Left Arm, Patient Position: Sitting)   Pulse 88   Temp (!) 97.1 F (36.2 C) (Temporal)   Resp 18   Ht 5' 8 (1.727 m)   Wt 166 lb 6 oz (75.5 kg)   SpO2 100%   BMI 25.30 kg/m

## 2024-04-23 NOTE — Progress Notes (Signed)
 Radiation Oncology         [phone] ________________________________  Name: Lance [name] MRN: [id number]  Date: 04/23/2024  DOB: [birth year]  Follow-Up Visit Note  CC: Larnell Hamilton, MD  Larnell Hamilton, MD    ICD-10-CM   1. Mediastinal lymphadenopathy  R59.0 MR Brain W Wo Contrast    CANCELED: MR Brain W Wo Contrast    2. 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; s/p palliative radiation to the lumbar and cervical spine completed on 02/27/2024   Interval Since Last Radiation:  19 days   ==========DELIVERED PLANS==========  First Treatment Date: 2024-01-29 Last Treatment Date: 2024-02-27   Plan Name: Spine_Lumbar Site: Lumbar Spine Technique: 3D Mode: Photon Dose Per Fraction: 3 Gy Prescribed Dose (Delivered / Prescribed): 30 Gy / 30 Gy Prescribed Fxs (Delivered / Prescribed): 10 / 10   Plan Name: Spine_C Site: Cervical Spine Technique: 3D Mode: Photon Dose Per Fraction: 3 Gy Prescribed Dose (Delivered / Prescribed): 30 Gy / 30 Gy Prescribed Fxs (Delivered / Prescribed): 10 / 10  Narrative:  The patient returns today for new complaints of swelling and pain to the back of his head.  He was last seen by Dr. Timmy on 04/09/2024 and reported to be doing well overall at that time. PET on 04/06/2024 demonstrated an overall positive response to treatment.  Today, he notes an approximate 2 week history of worsening localized pain and swelling to the back of his head, at the base of his skull. [name] able to feel a knot at the back of his head where the pain is. This pain was not present prior to his PET scan. He describes the pain as sharp and shooting in nature. He continues on 10 mg of Decadron  daily under the recommendations of Dr. Timmy.                           Allergies:  is allergic to methimazole  and  methocarbamol.  Meds: Current Outpatient Medications  Medication Sig Dispense Refill   dexamethasone  (DECADRON ) 4 MG tablet Take 5 tablets (20 mg total) by mouth daily. You must take this with food.  Preferably, take this in the morning with breakfast. (Patient taking differently: Take 20 mg by mouth daily. You must take this with food.  Preferably, take this in the morning with breakfast.  Taking 10 mg daily) 120 tablet 2   ALPRAZolam  (XANAX ) 0.25 MG tablet Take 0.25 mg by mouth at bedtime as needed for sleep.     ALPRAZolam  (XANAX ) 0.5 MG tablet Take 1 tablet (0.5 mg total) by mouth 2 (two) times daily as needed for anxiety. 30 tablet 0   amLODipine  (NORVASC ) 5 MG tablet Take 1 tablet (5 mg total) by mouth daily. (Patient not taking: Reported on 04/09/2024) 30 tablet 6   bicalutamide  (CASODEX ) 50 MG tablet Take 1 tablet (50 mg total) by mouth daily. 30 tablet 12   fluconazole  (DIFLUCAN ) 100 MG tablet Take 1 tablet (100 mg total) by mouth daily. 7 tablet 0   hydrocortisone  (ANUSOL -HC) 25 MG suppository Place 1 suppository (25 mg total) rectally every 12 (twelve) hours. 14 suppository 1   levothyroxine (SYNTHROID) 100 MCG tablet Take 100 mcg by mouth daily.     lidocaine  (XYLOCAINE ) 2 % solution Use as directed 15 mLs in the  mouth or throat every 4 (four) hours as needed for mouth pain. 500 mL 4   ondansetron  (ZOFRAN ) 4 MG tablet Take 1 tablet (4 mg total) by mouth every 8 (eight) hours as needed for nausea or vomiting. 20 tablet 0   pantoprazole  (PROTONIX ) 40 MG tablet Take 1 tablet (40 mg total) by mouth 2 (two) times daily. 60 tablet 5   sucralfate  (CARAFATE ) 1 g tablet Take 1 tablet (1 g total) by mouth 4 (four) times daily -  with meals and at bedtime. Crush and dissolve in 10 mL of warm water prior to swallowing, take 20 to 30 minutes prior to meals 120 tablet 1   traMADol  (ULTRAM ) 50 MG tablet Take 50 mg by mouth 2 (two) times daily.     zolpidem  (AMBIEN ) 10 MG tablet Take 10 mg by mouth  at bedtime.     No current facility-administered medications for this encounter.    Physical Findings: The patient is in no acute distress. Patient is alert and oriented.  height is 5' 8 (1.727 m) and weight is 166 lb 6 oz (75.5 kg). His temporal temperature is 97.1 F (36.2 C) (abnormal). His blood pressure is 146/102 (abnormal) and his pulse is 88. His respiration is 18 and oxygen saturation is 100%.   In general this is a well appearing male in no acute distress. He's alert and oriented x4 and appropriate throughout the examination. Cardiopulmonary assessment is negative for acute distress and he exhibits normal effort.     Mild swelling at the base of skull. No obvious palpable masses. No tenderness to palpation. EOMs intact. No signs of mucositis within the oropharynx. No concerning lesions or obvious signs of thrush within the oral cavity.    Lab Findings: Lab Results  Component Value Date   WBC 5.5 04/09/2024   HGB 13.8 04/09/2024   HCT 39.8 04/09/2024   MCV 90.0 04/09/2024   PLT 217 04/09/2024    Radiographic Findings: NM PET Image Restag (PS) Skull Base To Thigh Result Date: 04/06/2024 CLINICAL DATA:  Subsequent treatment strategy for metastatic salivary gland cancer. EXAM: NUCLEAR MEDICINE PET SKULL BASE TO THIGH TECHNIQUE: 7.99 mCi F-18 FDG was injected intravenously. Full-ring PET imaging was performed from the skull base to thigh after the radiotracer. CT data was obtained and used for attenuation correction and anatomic localization. Fasting blood glucose: 90 mg/dl COMPARISON:  Abdominopelvic CT 02/17/2024.  PET-CT 12/10/2023 FINDINGS: Mediastinal blood pool activity: SUV max 2.2 NECK: No hypermetabolic cervical lymph nodes are identified.Fairly symmetric activity within the lymphoid tissue of Waldeyer's ring is within physiologic limits. No suspicious activity identified within the pharyngeal mucosal space. Incidental CT findings: none CHEST: Interval substantial  improvement in the previously demonstrated hypermetabolic mediastinal adenopathy. There is a small residual pre-vascular node measuring 1.8 x 0.9 cm on image 72 which has an SUV max of 3.7 (previously 5.9). No other residual enlarged or hypermetabolic mediastinal, hilar, axillary or supraclavicular lymph nodes. No hypermetabolic pulmonary activity or suspicious nodularity. Incidental CT findings: Stable small right apical bleb. Mild coronary artery atherosclerosis. ABDOMEN/PELVIS: There is no hypermetabolic activity within the liver, adrenal glands, spleen or pancreas. Specifically, the previously demonstrated hypermetabolic splenic lesions have resolved. No hypermetabolic lymph nodes identified within the abdomen or pelvis. There is physiologic bowel activity. Incidental CT findings: There are stable simple and mildly complex renal cysts bilaterally, consistent with Bosniak 1 and 2 lesions. No specific follow-up imaging recommended. Mild aortoiliac atherosclerosis. The small bowel wall thickening seen on the most  recent CT has resolved. SKELETON: Again demonstrated are multiple sclerotic metastases within the thoracolumbar spine and ribs. The hypermetabolic activity previously demonstrated has nearly completely resolved in the interval. There is mild residual activity within the L5 vertebral body (SUV max 2.8, previously 15.1). Spinal augmentation has been performed at this level. No new metastases identified. Incidental CT findings: Multilevel spondylosis. Underlying bilateral L5 pars defects. IMPRESSION: 1. Interval response to therapy with near complete resolution of previously demonstrated hypermetabolic mediastinal adenopathy and resolution of previously demonstrated hypermetabolic splenic lesions. 2. Interval near complete resolution of previously demonstrated hypermetabolic osseous metastatic disease. There is mild residual activity within the L5 vertebral body. 3. No new metastatic disease identified. 4.  Interval resolution of previously demonstrated small bowel wall thickening. 5.  Aortic Atherosclerosis (ICD10-I70.0). Electronically Signed   By: Elsie Perone M.D.   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 Recent PET demonstrates a positive response to treatment; however this pain developed after his PET imaging. Given his history, we have ordered an MRI of the brain for further evaluation. I will call him with the results and plan moving forward.   Patient is scheduled to see Dr. Timmy and his next Lupron  injection on 07/10/24.   20 minutes of total time was spent for this patient encounter, including preparation, face-to-face counseling with the patient and coordination of care, physical exam, and documentation of the encounter. ____________________________________   Leeroy Due, PA-C   This document serves as a record of services personally performed by Leeroy Due, PA-C. It was created on her behalf by Dorthy Fuse, a trained medical scribe. The creation of this record is based on the scribe's personal observations and the provider's statements to them. This document has been checked and approved by the attending provider.

## 2024-04-29 ENCOUNTER — Encounter: Payer: Self-pay | Admitting: *Deleted

## 2024-04-29 NOTE — Progress Notes (Signed)
 Received a call from patient's wife, Lance [name]. She wanted to share some concerns about swelling and pain the patient is experiencing to his head and neck. She states that he's had generalized swelling of his collar bone and different areas of his head including a lump behind his ear. These areas of swelling are often accompanied by sharp pains. They shared these concerns with Dr Shannon yesterday and patient Lance [name] have an MRI Wednesday evening.   Reviewed concerns with Dr Timmy. Lance [name] Lance [name] follow up with MRI results and any potential follow up once results received.   Oncology Nurse Navigator Documentation     04/29/2024    8:45 AM  Oncology Nurse Navigator Flowsheets  Navigator Follow Up Date: 05/01/2024  Navigator Follow Up Reason: Scan Review  Navigator Location [facility]  Navigator Encounter Type Telephone  Telephone Incoming Call  Patient Visit Type MedOnc  Treatment Phase Active Tx  Barriers/Navigation Needs No Barriers At This Time  Interventions Education;Psycho-Social Support  Acuity Level 1-No Barriers  Support Groups/Services Friends and Family  Time Spent with Patient 15

## 2024-04-30 ENCOUNTER — Ambulatory Visit (HOSPITAL_COMMUNITY)
Admission: RE | Admit: 2024-04-30 | Discharge: 2024-04-30 | Disposition: A | Source: Ambulatory Visit | Attending: Radiology

## 2024-04-30 DIAGNOSIS — R590 Localized enlarged lymph nodes: Secondary | ICD-10-CM | POA: Diagnosis present

## 2024-04-30 DIAGNOSIS — C089 Malignant neoplasm of major salivary gland, unspecified: Secondary | ICD-10-CM | POA: Insufficient documentation

## 2024-04-30 MED ORDER — GADOBUTROL 1 MMOL/ML IV SOLN
7.0000 mL | Freq: Once | INTRAVENOUS | Status: AC | PRN
  Administered 2024-04-30: 7 mL via INTRAVENOUS

## 2024-05-02 ENCOUNTER — Encounter: Payer: Self-pay | Admitting: *Deleted

## 2024-05-02 NOTE — Progress Notes (Signed)
 Reviewed MRI with Dr Timmy. No new orders received. He states any follow up can be deferred to ordering physician.   Oncology Nurse Navigator Documentation     05/02/2024   11:45 AM  Oncology Nurse Navigator Flowsheets  Navigator Follow Up Date: 06/09/2024  Navigator Follow Up Reason: Follow-up Appointment  Navigator Location [facility]  Navigator Encounter Type Scan Review  Patient Visit Type MedOnc  Treatment Phase Active Tx  Barriers/Navigation Needs No Barriers At This Time  Interventions None Required  Acuity Level 1-No Barriers  Support Groups/Services Friends and Family  Time Spent with Patient 15

## 2024-05-09 ENCOUNTER — Encounter: Payer: Self-pay | Admitting: Radiology

## 2024-05-09 NOTE — Progress Notes (Signed)
 Called x2 to review results of MRI from 04/30/2024. LVM sharing the results that fortunately show no evidence of metastatic disease. I asked the patient to call back if his pain has not improved since last seeing him.    Leeroy Due, PA-C

## 2024-05-20 ENCOUNTER — Other Ambulatory Visit: Payer: Self-pay | Admitting: *Deleted

## 2024-05-20 ENCOUNTER — Encounter: Payer: Self-pay | Admitting: *Deleted

## 2024-05-20 MED ORDER — MEGESTROL ACETATE 20 MG PO TABS: 20.0000 mg | ORAL_TABLET | Freq: Every day | ORAL | 2 refills | Status: AC

## 2024-05-20 NOTE — Progress Notes (Signed)
 Patient calling with complaints of worsening hot flashes. He states that during the day they're tolerable, but at night they are keeping him from sleeping. He mentions that Dr Timmy mentioned a pharmaceutical intervention. Spoke to Dr Timmy and he would like Megace 20mg  daily sent into his pharmacy.  Oncology Nurse Navigator Documentation     05/20/2024    9:15 AM  Oncology Nurse Navigator Flowsheets  Navigator Follow Up Date: 06/09/2024  Navigator Follow Up Reason: Follow-up Appointment  Navigator Location [facility]  Navigator Encounter Type Telephone  Telephone Symptom Mgt;Incoming Call  Patient Visit Type MedOnc  Treatment Phase Active Tx  Barriers/Navigation Needs No Barriers At This Time  Interventions Medication Assistance;Education  Acuity Level 1-No Barriers  Education Method Verbal  Support Groups/Services Friends and Family  Time Spent with Patient 15

## 2024-05-29 ENCOUNTER — Encounter: Payer: Self-pay | Admitting: *Deleted

## 2024-05-29 ENCOUNTER — Telehealth: Payer: Self-pay | Admitting: *Deleted

## 2024-05-29 NOTE — Telephone Encounter (Signed)
 Call placed back to patient to have pt re-check BP per order of Dr. Timmy.  No answer from pt.'s phone.  Call then placed to patient's wife to instruct wife to have pt re-check BP per order of Dr. Timmy.  Pt.'s wife states that she was able to get an appt with pt.'s PCP tomorrow at 10:00AM for pt.'s elevated BP and does not know if pt has re-checked his BP today, due to she has been at work.  Lance [name] is appreciative of call back and states that she will keep us  informed after appt with pt.'s PCP tomorrow.  Dr. Timmy notified.

## 2024-05-29 NOTE — Progress Notes (Signed)
 Received call from patient's wife. Patient started Megace  on 05/25/2024. He began to experience cold spells and nausea. His blood pressure also increased. The last two mornings his BP reading have been 166/105 and 167/116. He stopped taking his Megace  on 05/27/2024. [name] taking his 5mg  Norvasc  daily.   Reviewed the above with Dr Timmy. He does not believe that the Megace  is responsible for the BP being as high as it is. He would like patient to follow up with his family doctor.   Called patient's wife back and she states that last year Dr Timmy told the patient to not worry about following up with PCP, and that he would take care of everything. Encouraged wife to go ahead and call PCP to maintain active patient status, but that I would speak to Dr Timmy again.   This navigator had to leave the office, so Shanda PEAK, the desk nurse will follow up with Dr Timmy and the patient.   Oncology Nurse Navigator Documentation     05/29/2024    9:00 AM  Oncology Nurse Navigator Flowsheets  Navigator Follow Up Date: 06/09/2024  Navigator Follow Up Reason: Follow-up Appointment;Chemotherapy  Financial Risk Analyst Encounter Type Telephone  Telephone Symptom Mgt;Incoming Call  Patient Visit Type MedOnc  Treatment Phase Active Tx  Barriers/Navigation Needs Morbidities/Frailty  Interventions Education;Medication Assistance;Psycho-Social Support  Acuity Level 2-Minimal Needs (1-2 Barriers Identified)  Education Method Verbal  Support Groups/Services Friends and Family  Time Spent with Patient 30

## 2024-05-30 ENCOUNTER — Encounter: Payer: Self-pay | Admitting: Hematology & Oncology

## 2024-06-09 ENCOUNTER — Encounter (HOSPITAL_BASED_OUTPATIENT_CLINIC_OR_DEPARTMENT_OTHER): Payer: Self-pay | Admitting: Radiology

## 2024-06-09 ENCOUNTER — Other Ambulatory Visit: Payer: Self-pay

## 2024-06-09 ENCOUNTER — Emergency Department (HOSPITAL_BASED_OUTPATIENT_CLINIC_OR_DEPARTMENT_OTHER)

## 2024-06-09 ENCOUNTER — Emergency Department (HOSPITAL_BASED_OUTPATIENT_CLINIC_OR_DEPARTMENT_OTHER)
Admission: EM | Admit: 2024-06-09 | Discharge: 2024-06-09 | Disposition: A | Discharge status: Home / Self Care | Attending: Emergency Medicine | Admitting: Emergency Medicine

## 2024-06-09 DIAGNOSIS — M545 Low back pain, unspecified: Secondary | ICD-10-CM | POA: Insufficient documentation

## 2024-06-09 DIAGNOSIS — M549 Dorsalgia, unspecified: Secondary | ICD-10-CM | POA: Diagnosis present

## 2024-06-09 DIAGNOSIS — Z79890 Hormone replacement therapy (postmenopausal): Secondary | ICD-10-CM | POA: Diagnosis not present

## 2024-06-09 DIAGNOSIS — C7951 Secondary malignant neoplasm of bone: Secondary | ICD-10-CM | POA: Insufficient documentation

## 2024-06-09 DIAGNOSIS — Z85858 Personal history of malignant neoplasm of other endocrine glands: Secondary | ICD-10-CM | POA: Insufficient documentation

## 2024-06-09 DIAGNOSIS — N200 Calculus of kidney: Secondary | ICD-10-CM | POA: Insufficient documentation

## 2024-06-09 DIAGNOSIS — E039 Hypothyroidism, unspecified: Secondary | ICD-10-CM | POA: Insufficient documentation

## 2024-06-09 LAB — URINALYSIS, ROUTINE W REFLEX MICROSCOPIC
Bacteria, UA: NONE SEEN
Bilirubin Urine: NEGATIVE
Glucose, UA: NEGATIVE mg/dL
Ketones, ur: 15 mg/dL — AB
Leukocytes,Ua: NEGATIVE
Nitrite: NEGATIVE
Protein, ur: NEGATIVE mg/dL
Specific Gravity, Urine: 1.024 (ref 1.005–1.030)
pH: 5.5 (ref 5.0–8.0)

## 2024-06-09 LAB — BASIC METABOLIC PANEL WITH GFR
Anion gap: 12 (ref 5–15)
BUN: 19 mg/dL (ref 6–20)
CO2: 23 mmol/L (ref 22–32)
Calcium: 9.7 mg/dL (ref 8.9–10.3)
Chloride: 102 mmol/L (ref 98–111)
Creatinine, Ser: 0.83 mg/dL (ref 0.61–1.24)
GFR, Estimated: >60 mL/min
Glucose, Bld: 98 mg/dL (ref 70–99)
Potassium: 4 mmol/L (ref 3.5–5.1)
Sodium: 137 mmol/L (ref 135–145)

## 2024-06-09 LAB — CBC
HCT: 36 % — ABNORMAL LOW (ref 39.0–52.0)
Hemoglobin: 12.9 g/dL — ABNORMAL LOW (ref 13.0–17.0)
MCH: 32.9 pg (ref 26.0–34.0)
MCHC: 35.8 g/dL (ref 30.0–36.0)
MCV: 91.8 fL (ref 80.0–100.0)
Platelets: 309 K/uL (ref 150–400)
RBC: 3.92 MIL/uL — ABNORMAL LOW (ref 4.22–5.81)
RDW: 13 % (ref 11.5–15.5)
WBC: 5.2 K/uL (ref 4.0–10.5)
nRBC: 0 % (ref 0.0–0.2)

## 2024-06-09 MED ORDER — LIDOCAINE 5 % EX PTCH: 1.0000 | MEDICATED_PATCH | CUTANEOUS | 0 refills | Status: AC

## 2024-06-09 MED ORDER — HYDROMORPHONE HCL 1 MG/ML IJ SOLN
1.0000 mg | Freq: Once | INTRAMUSCULAR | Status: AC
  Administered 2024-06-09: 1 mg via INTRAVENOUS
  Filled 2024-06-09: qty 1

## 2024-06-09 MED ORDER — CYCLOBENZAPRINE HCL 10 MG PO TABS: 10.0000 mg | ORAL_TABLET | Freq: Two times a day (BID) | ORAL | 0 refills | Status: AC | PRN

## 2024-06-09 MED ORDER — HYDROCODONE-ACETAMINOPHEN 5-325 MG PO TABS: 1.0000 | ORAL_TABLET | Freq: Four times a day (QID) | ORAL | 0 refills | Status: AC | PRN

## 2024-06-09 MED ORDER — OXYCODONE-ACETAMINOPHEN 5-325 MG PO TABS
1.0000 | ORAL_TABLET | ORAL | Status: DC | PRN
  Administered 2024-06-09: 1 via ORAL
  Filled 2024-06-09: qty 1

## 2024-06-09 MED ORDER — ONDANSETRON HCL 4 MG/2ML IJ SOLN
4.0000 mg | Freq: Once | INTRAMUSCULAR | Status: AC
  Administered 2024-06-09: 4 mg via INTRAVENOUS
  Filled 2024-06-09: qty 2

## 2024-06-09 NOTE — ED Provider Notes (Signed)
 " [place] [facility] Provider Note   CSN: [id number] Arrival date & time: 06/09/24  1742     Patient presents with: Back Pain   Lance [name] is a 59 y.o. male.    Back Pain    Patient presented to the ED for evaluation of back pain.  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  Patient states he started having back pain couple days ago.  He did not think it was that severe and he was able to go to work.  It was mostly on the right lower side.  Patient thought he was doing well but also on started having more severe pain again today.  He has had kidney stones in the past but this does not feel like that.  Pain does increase with movement and palpation.  No fevers or chills.  No rashes  Prior to Admission medications  Medication Sig Start Date End Date Taking? Authorizing Provider  cyclobenzaprine  (FLEXERIL ) 10 MG tablet Take 1 tablet (10 mg total) by mouth 2 (two) times daily as needed for muscle spasms. 06/09/24  Yes Randol Simmonds, MD  HYDROcodone -acetaminophen  (NORCO/VICODIN) 5-325 MG tablet Take 1 tablet by mouth every 6 (six) hours as needed. 06/09/24  Yes Randol Simmonds, MD  lidocaine  (LIDODERM ) 5 % Place 1 patch onto the skin daily. Remove & Discard patch within 12 hours or as directed by MD 06/09/24  Yes Randol Simmonds, MD  ALPRAZolam  (XANAX ) 0.25 MG tablet Take 0.25 mg by mouth at bedtime as needed for sleep. 12/20/23   [provider]  ALPRAZolam  (XANAX ) 0.5 MG tablet Take 1 tablet (0.5 mg total) by mouth 2 (two) times daily as needed for anxiety. 02/05/24   Shannon Agent, MD  amLODipine  (NORVASC ) 5 MG tablet Take 1 tablet (5 mg total) by mouth daily. Patient not taking: Reported on 04/09/2024 02/08/24   Timmy Maude SAUNDERS, MD  bicalutamide  (CASODEX ) 50 MG tablet Take 1 tablet (50 mg total) by mouth daily. 01/14/24   Timmy Maude SAUNDERS, MD  dexamethasone  (DECADRON ) 4 MG tablet Take 5 tablets (20 mg  total) by mouth daily. You must take this with food.  Preferably, take this in the morning with breakfast. Patient taking differently: Take 20 mg by mouth daily. You must take this with food.  Preferably, take this in the morning with breakfast.  Taking 10 mg daily 02/11/24   Timmy Maude SAUNDERS, MD  fluconazole  (DIFLUCAN ) 100 MG tablet Take 1 tablet (100 mg total) by mouth daily. 03/06/24   Shannon Agent, MD  hydrocortisone  (ANUSOL -HC) 25 MG suppository Place 1 suppository (25 mg total) rectally every 12 (twelve) hours. 04/01/24   Beather Delon Gibson, PA  levothyroxine (SYNTHROID) 100 MCG tablet Take 100 mcg by mouth daily. 07/28/21   [provider]  lidocaine  (XYLOCAINE ) 2 % solution Use as directed 15 mLs in the mouth or throat every 4 (four) hours as needed for mouth pain. 03/04/24   Timmy Maude SAUNDERS, MD  megestrol  (MEGACE ) 20 MG tablet Take 1 tablet (20 mg total) by mouth daily. 05/20/24   Timmy Maude SAUNDERS, MD  ondansetron  (ZOFRAN ) 4 MG tablet Take 1 tablet (4 mg total) by mouth every 8 (eight) hours as needed for nausea or vomiting. 02/05/24   Shannon Agent, MD  pantoprazole  (PROTONIX ) 40 MG tablet Take 1 tablet (40 mg total) by mouth 2 (two) times daily. 02/08/24   Timmy Maude SAUNDERS, MD  sucralfate  (CARAFATE ) 1 g tablet Take  1 tablet (1 g total) by mouth 4 (four) times daily -  with meals and at bedtime. Crush and dissolve in 10 mL of warm water prior to swallowing, take 20 to 30 minutes prior to meals 03/06/24   Shannon Agent, MD  traMADol  (ULTRAM ) 50 MG tablet Take 50 mg by mouth 2 (two) times daily.    [provider]  zolpidem  (AMBIEN ) 10 MG tablet Take 10 mg by mouth at bedtime.    [provider]    Allergies: Methimazole  and Methocarbamol    Review of Systems  Musculoskeletal:  Positive for back pain.    Updated Vital Signs BP (!) 150/104 (BP Location: Right Arm)   Pulse 90   Temp 98.7 F (37.1 C) (Oral)   Resp 16   Ht 1.727 m (5' 8)   Wt 75.3 kg    SpO2 97%   BMI 25.24 kg/m   Physical Exam Vitals and nursing note reviewed.  Constitutional:      General: [name] not in acute distress.    Appearance: [name] well-developed.  HENT:     Head: Normocephalic and atraumatic.     Right Ear: External ear normal.     Left Ear: External ear normal.  Eyes:     General: No scleral icterus.       Right eye: No discharge.        Left eye: No discharge.     Conjunctiva/sclera: Conjunctivae normal.  Neck:     Trachea: No tracheal deviation.  Cardiovascular:     Rate and Rhythm: Normal rate and regular rhythm.  Pulmonary:     Effort: Pulmonary effort is normal. No respiratory distress.     Breath sounds: Normal breath sounds. No stridor. No wheezing or rales.  Abdominal:     General: Bowel sounds are normal. There is no distension.     Palpations: Abdomen is soft.     Tenderness: There is no abdominal tenderness. There is no guarding or rebound.  Musculoskeletal:        General: Tenderness present. No deformity.     Cervical back: Neck supple.     Comments: Tenderness to palpation paraspinal region lumbar spine  Skin:    General: Skin is warm and dry.     Findings: No rash.  Neurological:     General: No focal deficit present.     Mental Status: [name] alert.     Cranial Nerves: No cranial nerve deficit, dysarthria or facial asymmetry.     Sensory: No sensory deficit.     Motor: No abnormal muscle tone or seizure activity.     Coordination: Coordination normal.  Psychiatric:        Mood and Affect: Mood normal.     (all labs ordered are listed, but only abnormal results are displayed) Labs Reviewed  URINALYSIS, ROUTINE W REFLEX MICROSCOPIC - Abnormal; Notable for the following components:      Result Value   Hgb urine dipstick SMALL (*)    Ketones, ur 15 (*)    All other components within normal limits  CBC - Abnormal; Notable for the following components:   RBC 3.92 (*)    Hemoglobin 12.9 (*)    HCT 36.0 (*)    All other  components within normal limits  BASIC METABOLIC PANEL WITH GFR    EKG: None  Radiology: CT Renal Stone Study Result Date: 06/09/2024 EXAM: CT ABDOMEN AND PELVIS WITHOUT CONTRAST 06/09/2024 10:29:53 PM TECHNIQUE: CT of the  abdomen and pelvis was performed without the administration of intravenous contrast. Multiplanar reformatted images are provided for review. Automated exposure control, iterative reconstruction, and/or weight-based adjustment of the mA/kV was utilized to reduce the radiation dose to as low as reasonably achievable. COMPARISON: 02/17/2024 CLINICAL HISTORY: Abdominal/flank pain, stone suspected. FINDINGS: LOWER CHEST: No acute abnormality. LIVER: The liver is unremarkable. GALLBLADDER AND BILE DUCTS: Gallbladder is unremarkable. No biliary ductal dilatation. SPLEEN: No acute abnormality. PANCREAS: No acute abnormality. ADRENAL GLANDS: No acute abnormality. KIDNEYS, URETERS AND BLADDER: Numerous bilateral renal cysts. 2 mm left lower pole renal stone. No ureteral stones or hydronephrosis. No perinephric or periureteral stranding. Urinary bladder is unremarkable. GI AND BOWEL: Stomach demonstrates no acute abnormality. Normal appendix. There is no bowel obstruction. PERITONEUM AND RETROPERITONEUM: No ascites. No free air. VASCULATURE: Aorta is normal in caliber. Aortic atherosclerosis. LYMPH NODES: No lymphadenopathy. REPRODUCTIVE ORGANS: No acute abnormality. BONES AND SOFT TISSUES: Changes of prior vertebroplasty at L5. Sclerotic metastases again noted at T11, L4, and L5. No focal soft tissue abnormality. IMPRESSION: 1. 2 mm left lower pole renal stone without ureteral stone or hydronephrosis. 2. 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 Electronically signed by: Franky Crease MD 06/09/2024 10:35 PM EST RP Workstation: [id number]     Procedures   Medications Ordered in the ED  oxyCODONE -acetaminophen  (PERCOCET/ROXICET) 5-325 MG per tablet 1 tablet (1 tablet Oral Given  06/09/24 1830)  HYDROmorphone  (DILAUDID ) injection 1 mg (1 mg Intravenous Given 06/09/24 2253)  ondansetron  (ZOFRAN ) injection 4 mg (4 mg Intravenous Given 06/09/24 2253)    Clinical Course as of 06/09/24 2313  Mon Jun 09, 2024  2209 Basic metabolic panel Normal [JK]  2209 Urinalysis, Routine w reflex microscopic -Urine, Clean Catch(!) Small amount of blood [JK]  2209 CBC(!) Normal [JK]  2247 CT scan shows 2 mm left renal stone without ureteral stone or hydronephrosis.  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 Decision-making details documented in ED Course. Radiology: ordered and independent interpretation performed.  Risk Prescription drug management.   Patient presented to the ED with complaints of acute lower back pain.  Patient does have history of malignancy with metastases to the bone.  Patient's laboratory tests are unremarkable.  No findings to suggest acute infection.  No signs of acute blood loss.  No signs of acute kidney injury.  Urinalysis not suggestive of infection.  CT scan does show incidental kidney stone but no signs of ureterolithiasis.  Do not think this is related to his symptoms.  Patient does have metastatic bone lesions but these are chronic and there are no acute changes.  Suspect symptoms are most likely musculoskeletal in nature.  Will discharge home with medications for pain.  Evaluation and diagnostic testing in the emergency department does not suggest an emergent condition requiring  admission or immediate intervention beyond what has been performed at this time.  The patient is safe for  discharge and has been instructed to return immediately for worsening symptoms, change in symptoms or any other concerns.     Final diagnoses:  Malignant neoplasm metastatic to bone [facility])  Kidney stone  Acute left-sided low back pain without sciatica    ED Discharge Orders          Ordered    HYDROcodone -acetaminophen  (NORCO/VICODIN) 5-325 MG tablet  Every 6 hours PRN        06/09/24 2312    cyclobenzaprine  (FLEXERIL ) 10 MG tablet  2 times daily PRN        06/09/24 2312    lidocaine  (LIDODERM ) 5 %  Every 24 hours        06/09/24 2312               Randol Simmonds, MD 06/09/24 2315  "

## 2024-06-09 NOTE — ED Triage Notes (Signed)
 Pt states for the past several hours he has had severe right side back pain. He initially thought it was a spasm but states this is worse than any spasm that he has ever had. Pt endorses that he has had kidney stones in the past and feels that this may be one. Denies urinary symptoms.

## 2024-06-09 NOTE — Discharge Instructions (Signed)
 Take the medications as needed to help with your pain and discomfort.  Follow-up with your doctor to be rechecked if the symptoms do not resolve over the next few days

## 2024-06-17 ENCOUNTER — Other Ambulatory Visit: Payer: Self-pay | Admitting: Hematology & Oncology

## 2024-06-17 ENCOUNTER — Encounter: Payer: Self-pay | Admitting: *Deleted

## 2024-06-17 DIAGNOSIS — C089 Malignant neoplasm of major salivary gland, unspecified: Secondary | ICD-10-CM

## 2024-06-17 DIAGNOSIS — R590 Localized enlarged lymph nodes: Secondary | ICD-10-CM

## 2024-06-17 MED ORDER — MORPHINE SULFATE 15 MG PO TABS: 15.0000 mg | ORAL_TABLET | Freq: Four times a day (QID) | ORAL | 0 refills | Status: AC | PRN

## 2024-06-20 ENCOUNTER — Encounter: Payer: Self-pay | Admitting: Hematology & Oncology

## 2024-06-23 ENCOUNTER — Encounter (HOSPITAL_COMMUNITY)

## 2024-06-25 ENCOUNTER — Encounter (HOSPITAL_COMMUNITY): Admission: RE | Admit: 2024-06-25 | Source: Ambulatory Visit

## 2024-06-25 DIAGNOSIS — C089 Malignant neoplasm of major salivary gland, unspecified: Secondary | ICD-10-CM

## 2024-06-25 LAB — GLUCOSE, CAPILLARY: Glucose-Capillary: 90 mg/dL (ref 70–99)

## 2024-06-25 MED ORDER — FLUDEOXYGLUCOSE F - 18 (FDG) INJECTION
8.0000 | Freq: Once | INTRAVENOUS | Status: AC
  Administered 2024-06-25: 8.08 via INTRAVENOUS

## 2024-06-30 ENCOUNTER — Ambulatory Visit (HOSPITAL_COMMUNITY)

## 2024-07-10 ENCOUNTER — Inpatient Hospital Stay: Admitting: Hematology & Oncology

## 2024-07-10 ENCOUNTER — Inpatient Hospital Stay: Attending: Hematology & Oncology

## 2024-07-10 ENCOUNTER — Inpatient Hospital Stay
